# Patient Record
Sex: Male | Born: 1965 | Race: Black or African American | Hispanic: No | Marital: Married | Smoking: Current every day smoker
Health system: Southern US, Community
[De-identification: ages and names within clinical notes are randomized; demographics above are authoritative.]

## PROBLEM LIST (undated history)

## (undated) DIAGNOSIS — I2699 Other pulmonary embolism without acute cor pulmonale: Secondary | ICD-10-CM

## (undated) DIAGNOSIS — F1010 Alcohol abuse, uncomplicated: Secondary | ICD-10-CM

## (undated) DIAGNOSIS — R079 Chest pain, unspecified: Secondary | ICD-10-CM

## (undated) DIAGNOSIS — I10 Essential (primary) hypertension: Secondary | ICD-10-CM

## (undated) DIAGNOSIS — R918 Other nonspecific abnormal finding of lung field: Secondary | ICD-10-CM

## (undated) HISTORY — PX: WRIST SURGERY: SHX841

---

## 2005-04-18 ENCOUNTER — Emergency Department (HOSPITAL_COMMUNITY): Admission: EM | Admit: 2005-04-18 | Discharge: 2005-04-18 | Payer: Self-pay | Admitting: Emergency Medicine

## 2006-03-06 ENCOUNTER — Emergency Department (HOSPITAL_COMMUNITY): Admission: EM | Admit: 2006-03-06 | Discharge: 2006-03-06 | Payer: Self-pay | Admitting: Emergency Medicine

## 2006-10-27 ENCOUNTER — Emergency Department (HOSPITAL_COMMUNITY): Admission: EM | Admit: 2006-10-27 | Discharge: 2006-10-27 | Payer: Self-pay | Admitting: Emergency Medicine

## 2007-01-14 ENCOUNTER — Emergency Department (HOSPITAL_COMMUNITY): Admission: EM | Admit: 2007-01-14 | Discharge: 2007-01-14 | Payer: Self-pay | Admitting: Family Medicine

## 2007-02-28 ENCOUNTER — Emergency Department (HOSPITAL_COMMUNITY): Admission: EM | Admit: 2007-02-28 | Discharge: 2007-02-28 | Payer: Self-pay | Admitting: *Deleted

## 2007-06-11 ENCOUNTER — Emergency Department (HOSPITAL_COMMUNITY): Admission: EM | Admit: 2007-06-11 | Discharge: 2007-06-11 | Payer: Self-pay | Admitting: Emergency Medicine

## 2007-06-15 ENCOUNTER — Ambulatory Visit: Payer: Self-pay | Admitting: *Deleted

## 2008-01-13 ENCOUNTER — Emergency Department (HOSPITAL_COMMUNITY): Admission: EM | Admit: 2008-01-13 | Discharge: 2008-01-13 | Payer: Self-pay | Admitting: Emergency Medicine

## 2008-03-28 ENCOUNTER — Emergency Department (HOSPITAL_COMMUNITY): Admission: EM | Admit: 2008-03-28 | Discharge: 2008-03-29 | Payer: Self-pay | Admitting: Emergency Medicine

## 2008-11-27 ENCOUNTER — Emergency Department (HOSPITAL_COMMUNITY): Admission: EM | Admit: 2008-11-27 | Discharge: 2008-11-27 | Payer: Self-pay | Admitting: Emergency Medicine

## 2008-12-23 ENCOUNTER — Ambulatory Visit (HOSPITAL_COMMUNITY): Admission: RE | Admit: 2008-12-23 | Discharge: 2008-12-23 | Payer: Self-pay | Admitting: Otolaryngology

## 2009-08-14 ENCOUNTER — Emergency Department (HOSPITAL_COMMUNITY): Admission: EM | Admit: 2009-08-14 | Discharge: 2009-08-14 | Payer: Self-pay | Admitting: Emergency Medicine

## 2009-10-25 ENCOUNTER — Emergency Department (HOSPITAL_COMMUNITY): Admission: EM | Admit: 2009-10-25 | Discharge: 2009-10-25 | Payer: Self-pay | Admitting: Family Medicine

## 2010-05-31 LAB — CBC
HCT: 37.7 % — ABNORMAL LOW (ref 39.0–52.0)
Hemoglobin: 12.2 g/dL — ABNORMAL LOW (ref 13.0–17.0)
MCHC: 32.4 g/dL (ref 30.0–36.0)
MCV: 71.5 fL — ABNORMAL LOW (ref 78.0–100.0)
Platelets: 308 10*3/uL (ref 150–400)
RBC: 5.28 MIL/uL (ref 4.22–5.81)
RDW: 16 % — ABNORMAL HIGH (ref 11.5–15.5)
WBC: 5.4 10*3/uL (ref 4.0–10.5)

## 2010-08-05 ENCOUNTER — Emergency Department (HOSPITAL_COMMUNITY)
Admission: EM | Admit: 2010-08-05 | Discharge: 2010-08-05 | Disposition: A | Discharge status: Home / Self Care | Payer: Self-pay | Attending: Emergency Medicine | Admitting: Emergency Medicine

## 2010-08-05 DIAGNOSIS — J029 Acute pharyngitis, unspecified: Secondary | ICD-10-CM | POA: Insufficient documentation

## 2010-08-05 DIAGNOSIS — R599 Enlarged lymph nodes, unspecified: Secondary | ICD-10-CM | POA: Insufficient documentation

## 2010-08-05 LAB — RAPID STREP SCREEN (MED CTR MEBANE ONLY): Streptococcus, Group A Screen (Direct): NEGATIVE

## 2010-08-05 LAB — MONONUCLEOSIS SCREEN: Mono Screen: NEGATIVE

## 2010-09-17 ENCOUNTER — Inpatient Hospital Stay (INDEPENDENT_AMBULATORY_CARE_PROVIDER_SITE_OTHER)
Admission: RE | Admit: 2010-09-17 | Discharge: 2010-09-17 | Disposition: A | Discharge status: Home / Self Care | Payer: Self-pay | Source: Ambulatory Visit | Attending: Family Medicine | Admitting: Family Medicine

## 2010-09-17 DIAGNOSIS — L259 Unspecified contact dermatitis, unspecified cause: Secondary | ICD-10-CM

## 2010-09-21 ENCOUNTER — Emergency Department (HOSPITAL_COMMUNITY)
Admission: EM | Admit: 2010-09-21 | Discharge: 2010-09-21 | Disposition: A | Discharge status: Home / Self Care | Payer: Self-pay | Attending: Emergency Medicine | Admitting: Emergency Medicine

## 2010-09-21 ENCOUNTER — Emergency Department (HOSPITAL_COMMUNITY): Payer: Self-pay

## 2010-09-21 DIAGNOSIS — R000 Tachycardia, unspecified: Secondary | ICD-10-CM | POA: Insufficient documentation

## 2010-09-21 DIAGNOSIS — S6990XA Unspecified injury of unspecified wrist, hand and finger(s), initial encounter: Secondary | ICD-10-CM | POA: Insufficient documentation

## 2010-09-21 DIAGNOSIS — R55 Syncope and collapse: Secondary | ICD-10-CM | POA: Insufficient documentation

## 2010-09-21 DIAGNOSIS — R296 Repeated falls: Secondary | ICD-10-CM | POA: Insufficient documentation

## 2010-09-21 DIAGNOSIS — S5000XA Contusion of unspecified elbow, initial encounter: Secondary | ICD-10-CM | POA: Insufficient documentation

## 2010-09-21 DIAGNOSIS — S59909A Unspecified injury of unspecified elbow, initial encounter: Secondary | ICD-10-CM | POA: Insufficient documentation

## 2010-09-21 DIAGNOSIS — M25529 Pain in unspecified elbow: Secondary | ICD-10-CM | POA: Insufficient documentation

## 2010-09-21 DIAGNOSIS — Y9367 Activity, basketball: Secondary | ICD-10-CM | POA: Insufficient documentation

## 2010-12-04 LAB — GC/CHLAMYDIA PROBE AMP, GENITAL: GC Probe Amp, Genital: NEGATIVE

## 2010-12-04 LAB — RPR: RPR Ser Ql: NONREACTIVE

## 2010-12-04 LAB — HIV ANTIBODY (ROUTINE TESTING W REFLEX): HIV: NONREACTIVE

## 2011-01-09 ENCOUNTER — Emergency Department (INDEPENDENT_AMBULATORY_CARE_PROVIDER_SITE_OTHER)
Admission: EM | Admit: 2011-01-09 | Discharge: 2011-01-09 | Disposition: A | Discharge status: Home / Self Care | Payer: Self-pay | Source: Home / Self Care | Attending: Family Medicine | Admitting: Family Medicine

## 2011-01-09 DIAGNOSIS — J111 Influenza due to unidentified influenza virus with other respiratory manifestations: Secondary | ICD-10-CM

## 2011-01-09 DIAGNOSIS — R6889 Other general symptoms and signs: Secondary | ICD-10-CM

## 2011-01-09 MED ORDER — AZITHROMYCIN 250 MG PO TABS: 250.0000 mg | ORAL_TABLET | Freq: Every day | ORAL | Status: AC

## 2011-01-09 MED ORDER — IBUPROFEN 600 MG PO TABS: 600.0000 mg | ORAL_TABLET | Freq: Four times a day (QID) | ORAL | Status: AC | PRN

## 2011-01-09 NOTE — ED Provider Notes (Signed)
History     CSN: [id number] Arrival date & time: 01/09/2011 12:35 PM   First MD Initiated Contact with Patient 01/09/11 1258      Chief Complaint  Patient presents with  . Influenza    (Consider location/radiation/quality/duration/timing/severity/associated sxs/prior treatment) HPI Comments:     Patient is a 45 y.o. male presenting with flu symptoms. The history is provided by the patient and the spouse.  Influenza This is a new problem. The current episode started more than 2 days ago. The problem has been gradually worsening. Associated symptoms include abdominal pain and shortness of breath. The symptoms are aggravated by nothing. The symptoms are relieved by nothing. He has tried acetaminophen for the symptoms. The treatment provided no relief.    History reviewed. No pertinent past medical history.  History reviewed. No pertinent past surgical history.  Family History  Problem Relation Age of Onset  . Diabetes Mother   . Hyperlipidemia Mother     History  Substance Use Topics  . Smoking status: Current Everyday Smoker    Types: Cigarettes  . Smokeless tobacco: Not on file  . Alcohol Use: Yes     occasinal      Review of Systems  Constitutional: Positive for fever and chills. Negative for appetite change.  HENT: Positive for sneezing. Negative for sore throat and trouble swallowing.   Eyes: Negative.   Respiratory: Positive for cough and shortness of breath.   Cardiovascular: Negative.   Gastrointestinal: Positive for nausea, vomiting, abdominal pain and diarrhea.  Genitourinary: Negative.   Musculoskeletal: Positive for myalgias.  Skin: Negative.   Neurological: Negative.     Allergies  Penicillins  Home Medications  No current outpatient prescriptions on file.  BP 130/90  Pulse 73  Temp(Src) 98.5 F (36.9 C) (Oral)  Resp 12  SpO2 99%  Physical Exam  Constitutional: He appears well-developed and well-nourished.  HENT:  Head: Normocephalic  and atraumatic.  Right Ear: Tympanic membrane and external ear normal.  Left Ear: Tympanic membrane and external ear normal.  Mouth/Throat: Uvula is midline, oropharynx is clear and moist and mucous membranes are normal.  Neck: Normal range of motion. Neck supple.  Cardiovascular: Normal rate and regular rhythm.   Pulmonary/Chest: Effort normal and breath sounds normal. He has no wheezes. He has no rhonchi. He has no rales.  Abdominal: Soft. Bowel sounds are normal.  Lymphadenopathy:    He has cervical adenopathy.  Skin: Skin is warm and dry.    ED Course  Procedures (including critical care time)  Labs Reviewed - No data to display No results found.   No diagnosis found.    MDM          Richardo Priest, MD 01/09/11 [phone]

## 2011-01-09 NOTE — ED Notes (Signed)
C/o generalized pain , nausea, diarrhea, HA, sweats, chills
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
## 2011-06-30 ENCOUNTER — Emergency Department (HOSPITAL_COMMUNITY)
Admission: EM | Admit: 2011-06-30 | Discharge: 2011-07-01 | Disposition: A | Discharge status: Home / Self Care | Payer: Self-pay | Attending: Emergency Medicine | Admitting: Emergency Medicine

## 2011-06-30 ENCOUNTER — Encounter (HOSPITAL_COMMUNITY): Payer: Self-pay | Admitting: Emergency Medicine

## 2011-06-30 ENCOUNTER — Emergency Department (HOSPITAL_COMMUNITY): Payer: Self-pay

## 2011-06-30 DIAGNOSIS — X500XXA Overexertion from strenuous movement or load, initial encounter: Secondary | ICD-10-CM | POA: Insufficient documentation

## 2011-06-30 DIAGNOSIS — R079 Chest pain, unspecified: Secondary | ICD-10-CM | POA: Insufficient documentation

## 2011-06-30 DIAGNOSIS — T148XXA Other injury of unspecified body region, initial encounter: Secondary | ICD-10-CM | POA: Insufficient documentation

## 2011-06-30 DIAGNOSIS — M25519 Pain in unspecified shoulder: Secondary | ICD-10-CM | POA: Insufficient documentation

## 2011-06-30 DIAGNOSIS — M62838 Other muscle spasm: Secondary | ICD-10-CM | POA: Insufficient documentation

## 2011-06-30 DIAGNOSIS — Y990 Civilian activity done for income or pay: Secondary | ICD-10-CM | POA: Insufficient documentation

## 2011-06-30 DIAGNOSIS — IMO0001 Reserved for inherently not codable concepts without codable children: Secondary | ICD-10-CM | POA: Insufficient documentation

## 2011-06-30 DIAGNOSIS — F17200 Nicotine dependence, unspecified, uncomplicated: Secondary | ICD-10-CM | POA: Insufficient documentation

## 2011-06-30 DIAGNOSIS — M542 Cervicalgia: Secondary | ICD-10-CM | POA: Insufficient documentation

## 2011-06-30 NOTE — ED Notes (Signed)
C/o pain to neck, head, and upper back x 2 days.  Denies recent injury.  Also c/o pain to "kidneys" x 6 months.

## 2011-07-01 MED ORDER — CYCLOBENZAPRINE HCL 10 MG PO TABS
10.0000 mg | ORAL_TABLET | Freq: Once | ORAL | Status: AC
  Administered 2011-07-01: 10 mg via ORAL
  Filled 2011-07-01: qty 1

## 2011-07-01 MED ORDER — CYCLOBENZAPRINE HCL 10 MG PO TABS: 10.0000 mg | ORAL_TABLET | Freq: Two times a day (BID) | ORAL | Status: AC | PRN

## 2011-07-01 NOTE — Discharge Instructions (Signed)
FOLLOW UP WITH YOUR DOCTOR OR WITH A PRIMARY CARE PROVIDER OF YOUR CHOICE FOR FURTHER EVALUATION IF SYMPTOMS PERSIST. TAKE FLEXERIL AS DIRECTED. RETURN HERE AS NEEDED.  Muscle Strain A muscle strain, or pulled muscle, occurs when a muscle is over-stretched. A small number of muscle fibers may also be torn. This is especially common in athletes. This happens when a sudden violent force placed on a muscle pushes it past its capacity. Usually, recovery from a pulled muscle takes 1 to 2 weeks. But complete healing will take 5 to 6 weeks. There are millions of muscle fibers. Following injury, your body will usually return to normal quickly. HOME CARE INSTRUCTIONS   While awake, apply ice to the sore muscle for 15 to 20 minutes each hour for the first 2 days. Put ice in a plastic bag and place a towel between the bag of ice and your skin.   Do not use the pulled muscle for several days. Do not use the muscle if you have pain.   You may wrap the injured area with an elastic bandage for comfort. Be careful not to bind it too tightly. This may interfere with blood circulation.   Only take over-the-counter or prescription medicines for pain, discomfort, or fever as directed by your caregiver. Do not use aspirin as this will increase bleeding (bruising) at injury site.   Warming up before exercise helps prevent muscle strains.  SEEK MEDICAL CARE IF:  There is increased pain or swelling in the affected area. MAKE SURE YOU:   Understand these instructions.   Will watch your condition.   Will get help right away if you are not doing well or get worse.  Document Released: 02/11/2005 Document Revised: 01/31/2011 Document Reviewed: 09/10/2006 [facility] Patient Information 2012 [place], [place].

## 2011-07-01 NOTE — ED Provider Notes (Signed)
Medical screening examination/treatment/procedure(s) were performed by non-physician practitioner and as supervising physician I was immediately available for consultation/collaboration.   Shelda Jakes, MD 07/01/11 [phone]

## 2011-07-01 NOTE — ED Provider Notes (Signed)
History     CSN: [id number]  Arrival date & time 06/30/11  2202   First MD Initiated Contact with Patient 06/30/11 2222      Chief Complaint  Patient presents with  . Neck Pain  . Abdominal Pain    (Consider location/radiation/quality/duration/timing/severity/associated sxs/prior treatment) Patient is a 46 y.o. male presenting with neck pain. The history is provided by the patient and the spouse.  Neck Pain  This is a new problem. The current episode started more than 1 week ago. The problem occurs constantly. The problem has not changed since onset.There has been no fever. Pertinent negatives include no chest pain. Associated symptoms comments: He started a new job one month ago which required heavy lifting. He felt he twisted the wrong way and became sore across shoulders and into neck that has remained sore for the past 3 weeks. Now he reports sharp intermittent pain like a spasm that extends to bilateral lower rib cage. No N, V. No cough, breathing problems. Pain is worse with movement and better at rest. No fever.Marland Kitchen    History reviewed. No pertinent past medical history.  History reviewed. No pertinent past surgical history.  Family History  Problem Relation Age of Onset  . Diabetes Mother   . Hyperlipidemia Mother     History  Substance Use Topics  . Smoking status: Current Everyday Smoker    Types: Cigarettes  . Smokeless tobacco: Not on file  . Alcohol Use: Yes     occasinal      Review of Systems  HENT: Positive for neck pain.   Respiratory: Negative for chest tightness and shortness of breath.   Cardiovascular: Negative for chest pain.  Gastrointestinal: Positive for abdominal pain. Negative for nausea and vomiting.  Musculoskeletal: Positive for myalgias.    Allergies  Penicillins  Home Medications  No current outpatient prescriptions on file.  BP 133/87  Pulse 74  Temp(Src) 98.2 F (36.8 C) (Oral)  Resp 16  SpO2 97%  Physical Exam    Constitutional: He is oriented to person, place, and time. He appears well-developed and well-nourished. No distress.  HENT:  Head: Normocephalic.  Neck: Normal range of motion.  Pulmonary/Chest: Effort normal. He has no wheezes. He has no rales. He exhibits no tenderness.  Abdominal: Soft. There is no tenderness.  Musculoskeletal:       Muscular tenderness bilateral trapezius muscles without palpable spasm. FROM neck and bilateral upper extremities.   Neurological: He is alert and oriented to person, place, and time.  Skin: Skin is warm and dry.    ED Course  Procedures (including critical care time)  Labs Reviewed - No data to display Dg Chest 2 View  07/01/2011  *RADIOLOGY REPORT*  Clinical Data: Chest pain and back pain.  CHEST - 2 VIEW  Comparison: None.  Findings: The lungs are well-aerated and clear.  There is no evidence of focal opacification, pleural effusion or pneumothorax.  The heart is normal in size; the mediastinal contour is within normal limits.  No acute osseous abnormalities are seen.  IMPRESSION: No acute cardiopulmonary process seen.  Original Report Authenticated By: Tonia Ghent, M.D.     No diagnosis found.  1. Muscle strain 2. Muscle spasm.  MDM  Pain of complaint is reproducible. CXR negative. Presentation supports muscular strain/spasm. Feel he is stable for discharge.         Rodena Medin, PA-C 07/01/11 0036  Rodena Medin, PA-C 07/01/11 0038

## 2011-10-22 ENCOUNTER — Emergency Department (HOSPITAL_COMMUNITY): Payer: Self-pay

## 2011-10-22 ENCOUNTER — Observation Stay (HOSPITAL_COMMUNITY)
Admission: EM | Admit: 2011-10-22 | Discharge: 2011-10-23 | Disposition: A | Discharge status: Home / Self Care | Payer: Self-pay | Attending: Emergency Medicine | Admitting: Emergency Medicine

## 2011-10-22 ENCOUNTER — Encounter (HOSPITAL_COMMUNITY): Payer: Self-pay | Admitting: *Deleted

## 2011-10-22 DIAGNOSIS — R112 Nausea with vomiting, unspecified: Secondary | ICD-10-CM | POA: Insufficient documentation

## 2011-10-22 DIAGNOSIS — N289 Disorder of kidney and ureter, unspecified: Secondary | ICD-10-CM | POA: Insufficient documentation

## 2011-10-22 DIAGNOSIS — E860 Dehydration: Secondary | ICD-10-CM | POA: Insufficient documentation

## 2011-10-22 DIAGNOSIS — F10929 Alcohol use, unspecified with intoxication, unspecified: Secondary | ICD-10-CM

## 2011-10-22 DIAGNOSIS — F17200 Nicotine dependence, unspecified, uncomplicated: Secondary | ICD-10-CM | POA: Insufficient documentation

## 2011-10-22 DIAGNOSIS — R404 Transient alteration of awareness: Principal | ICD-10-CM | POA: Insufficient documentation

## 2011-10-22 DIAGNOSIS — R51 Headache: Secondary | ICD-10-CM | POA: Insufficient documentation

## 2011-10-22 DIAGNOSIS — F1010 Alcohol abuse, uncomplicated: Secondary | ICD-10-CM | POA: Insufficient documentation

## 2011-10-22 LAB — CBC WITH DIFFERENTIAL/PLATELET
Basophils Relative: 0 % (ref 0–1)
Eosinophils Absolute: 0 10*3/uL (ref 0.0–0.7)
Hemoglobin: 13.7 g/dL (ref 13.0–17.0)
MCH: 22.7 pg — ABNORMAL LOW (ref 26.0–34.0)
MCHC: 34.5 g/dL (ref 30.0–36.0)
MCV: 65.8 fL — ABNORMAL LOW (ref 78.0–100.0)
Platelets: 345 10*3/uL (ref 150–400)
RBC: 6.03 MIL/uL — ABNORMAL HIGH (ref 4.22–5.81)
WBC: 8.8 10*3/uL (ref 4.0–10.5)

## 2011-10-22 LAB — COMPREHENSIVE METABOLIC PANEL
ALT: 37 U/L (ref 0–53)
AST: 47 U/L — ABNORMAL HIGH (ref 0–37)
BUN: 17 mg/dL (ref 6–23)
Calcium: 10.7 mg/dL — ABNORMAL HIGH (ref 8.4–10.5)
Chloride: 94 mEq/L — ABNORMAL LOW (ref 96–112)
GFR calc Af Amer: 38 mL/min — ABNORMAL LOW (ref >90)
GFR calc non Af Amer: 33 mL/min — ABNORMAL LOW (ref >90)
Total Bilirubin: 0.4 mg/dL (ref 0.3–1.2)

## 2011-10-22 NOTE — ED Notes (Signed)
Pt wife contacted as patient requested, wife stated she would be in to visit patient soon.

## 2011-10-22 NOTE — ED Notes (Signed)
Pt to MRI at this time, ABC's intact, vital signs stable.

## 2011-10-22 NOTE — ED Notes (Signed)
Pt in via EMS- per EMS, pt in after being found outside of [facility] unresponsive, upon EMS arrival pt was responsive to painful stimuli, cool and diaphoretic, CBG 116, HR 110, pupils equil and reactive, pt became more responsive c/o occipital head pain, will follow commands, cooperative, pt not answering all questions appropriately, no signs on head trauma, pt denies medical history. Pt does admit to mariajuana use and ETOH use. IV established in left FA 18g. EKG unremarkable for EMS.

## 2011-10-22 NOTE — ED Provider Notes (Signed)
History     CSN: [id number]  Arrival date & time 10/22/11  1929   None     Chief Complaint  Patient presents with  . Loss of Consciousness    (Consider location/radiation/quality/duration/timing/severity/associated sxs/prior treatment) HPI Comments: Patient is a 46 year old man who was reported by EMS to found outside a [employer] unconscious. The patient says that he was going to the store and woke up in the ED. He has pain in the back of his head, and the right occipital region. He says that he has had a problem with headaches and has been vomiting. He had previous syncopal episodes, but had not sought medical evaluation for that.  Patient is a 46 y.o. male presenting with syncope. The history is provided by the patient and the EMS personnel. No language interpreter was used.  Loss of Consciousness This is a new problem. The current episode started less than 1 hour ago. The problem has been gradually improving. Associated symptoms include headaches. Pertinent negatives include no chest pain, no abdominal pain and no shortness of breath. Nothing aggravates the symptoms. Nothing relieves the symptoms. Treatments tried: Pt was brought to [facility] ED by EMS for evaluaition.    History reviewed. No pertinent past medical history.  History reviewed. No pertinent past surgical history.  Family History  Problem Relation Age of Onset  . Diabetes Mother   . Hyperlipidemia Mother     History  Substance Use Topics  . Smoking status: Current Everyday Smoker    Types: Cigarettes  . Smokeless tobacco: Not on file  . Alcohol Use: Yes     occasinal      Review of Systems  Constitutional: Negative.  Negative for fever and chills.  Eyes: Negative.   Respiratory: Negative.  Negative for shortness of breath.   Cardiovascular: Positive for syncope. Negative for chest pain.  Gastrointestinal: Positive for nausea and vomiting. Negative for abdominal pain and diarrhea.    Genitourinary: Negative.   Musculoskeletal: Negative.   Neurological: Positive for syncope and headaches.  Psychiatric/Behavioral: Negative.     Allergies  Penicillins  Home Medications  No current outpatient prescriptions on file.  BP 127/84  Pulse 111  Temp 97.7 F (36.5 C) (Oral)  Resp 16  SpO2 98%  Physical Exam  Nursing note and vitals reviewed. Constitutional: He is oriented to person, place, and time.       Middle aged man complaining of right occipital headache.  HENT:  Head: Normocephalic and atraumatic.  Right Ear: External ear normal.  Left Ear: External ear normal.  Mouth/Throat: Oropharynx is clear and moist.  Eyes: Conjunctivae and EOM are normal. Pupils are equal, round, and reactive to light.  Neck: Normal range of motion. Neck supple.  Cardiovascular: Normal rate, regular rhythm and normal heart sounds.   Pulmonary/Chest: Effort normal and breath sounds normal.  Abdominal: Soft. Bowel sounds are normal. He exhibits no distension. There is no tenderness.  Musculoskeletal: Normal range of motion. He exhibits no edema and no tenderness.  Neurological: He is alert and oriented to person, place, and time.       No sensory or motor deficit.  Skin: Skin is warm and dry.  Psychiatric: He has a normal mood and affect. His behavior is normal.    ED Course  Procedures (including critical care time)  7:46 PM  Date: 10/22/2011  Rate: 105  Rhythm: sinus tachycardia  QRS Axis: normal  Intervals: normal PQRS:  Right atrial abnormality  ST/T Wave abnormalities:  normal  Conduction Disutrbances:none  Narrative Interpretation: Borderline EKG  Old EKG Reviewed: none available  8:43 PM Pt was seen and had physical examination.  Lab workup, CT of head ordered.  9:21 PM Radiologist says that CT of head suggests dural venous thrombosis, recommends MRI, MRV of brain to check on this possibility.  Those studies ordered.  11:59 PM Results for orders placed during  the hospital encounter of 10/22/11  CBC WITH DIFFERENTIAL      Component Value Range   WBC 8.8  4.0 - 10.5 K/uL   RBC 6.03 (*) 4.22 - 5.81 MIL/uL   Hemoglobin 13.7  13.0 - 17.0 g/dL   HCT 29.5  62.1 - 30.8 %   MCV 65.8 (*) 78.0 - 100.0 fL   MCH 22.7 (*) 26.0 - 34.0 pg   MCHC 34.5  30.0 - 36.0 g/dL   RDW 65.7 (*) 84.6 - 96.2 %   Platelets 345  150 - 400 K/uL   Neutrophils Relative 78 (*) 43 - 77 %   Lymphocytes Relative 16  12 - 46 %   Monocytes Relative 6  3 - 12 %   Eosinophils Relative 0  0 - 5 %   Basophils Relative 0  0 - 1 %   Neutro Abs 6.9  1.7 - 7.7 K/uL   Lymphs Abs 1.4  0.7 - 4.0 K/uL   Monocytes Absolute 0.5  0.1 - 1.0 K/uL   Eosinophils Absolute 0.0  0.0 - 0.7 K/uL   Basophils Absolute 0.0  0.0 - 0.1 K/uL   RBC Morphology TARGET CELLS    COMPREHENSIVE METABOLIC PANEL      Component Value Range   Sodium 138  135 - 145 mEq/L   Potassium 3.5  3.5 - 5.1 mEq/L   Chloride 94 (*) 96 - 112 mEq/L   CO2 22  19 - 32 mEq/L   Glucose, Bld 110 (*) 70 - 99 mg/dL   BUN 17  6 - 23 mg/dL   Creatinine, Ser 9.52 (*) 0.50 - 1.35 mg/dL   Calcium 84.1 (*) 8.4 - 10.5 mg/dL   Total Protein 8.8 (*) 6.0 - 8.3 g/dL   Albumin 4.8  3.5 - 5.2 g/dL   AST 47 (*) 0 - 37 U/L   ALT 37  0 - 53 U/L   Alkaline Phosphatase 76  39 - 117 U/L   Total Bilirubin 0.4  0.3 - 1.2 mg/dL   GFR calc non Af Amer 33 (*) >90 mL/min   GFR calc Af Amer 38 (*) >90 mL/min  ETHANOL      Component Value Range   Alcohol, Ethyl (B) 137 (*) 0 - 11 mg/dL  POCT I-STAT TROPONIN I      Component Value Range   Troponin i, poc 0.01  0.00 - 0.08 ng/mL   Comment 3            Ct Head Wo Contrast  10/22/2011  *RADIOLOGY REPORT*  Clinical Data: Found unconscious outside drug store, history syncope, right occipital headache, vomiting  CT HEAD WITHOUT CONTRAST  Technique:  Contiguous axial images were obtained from the base of the skull through the vertex without contrast.  Comparison: Prior head CT 11/27/2008  Findings:  Prominent high attenuation within the straight sinus, torcula and right transverse sinus.  While nonspecific, findings can be seen with dural venous thrombosis.  No evidence of associated acute venous infarction.  No mass lesion, mass effect, hydrocephalus, or midline shift.  Gray-white differentiation remains preserved.  Mastoid air cells and paranasal sinuses are well-aerated.  No acute calvarial abnormality.  IMPRESSION:  Prominent high attenuation within the straight sinus, torcula and right transverse sinus concerning for possible dural venous sinus thrombosis. No evidence of associated venous ischemia/infarct, or edema.  If clinically warranted, consider further evaluation with MRI including MRV.  Critical Value/emergent results were called by telephone at the time of interpretation on 10/22/2011 at 09:10 p.m. to Dr. Ignacia Palma, who verbally acknowledged these results.   Original Report Authenticated By: HEATH    12:23 AM Pt had MRI and MRV of brain, which showed no dural venous thrombosis.  His lab tests showed some renal insufficiency and elevated blood alcohol of 137.  He is awake and alert now.  Orthostatic VS ordered.    12:47 AM Pt was orthostatic.  Will place on dehydration protocol in CDU, repeat labs in the AM.  1. Dehydration   2. Alcohol intoxication   3. Renal insufficiency        Carleene Cooper III, MD 10/23/11 [phone]

## 2011-10-23 LAB — CBC
HCT: 39.2 % (ref 39.0–52.0)
MCV: 67.1 fL — ABNORMAL LOW (ref 78.0–100.0)
RBC: 5.84 MIL/uL — ABNORMAL HIGH (ref 4.22–5.81)
WBC: 7.7 10*3/uL (ref 4.0–10.5)

## 2011-10-23 LAB — BASIC METABOLIC PANEL
BUN: 16 mg/dL (ref 6–23)
CO2: 27 mEq/L (ref 19–32)
Chloride: 101 mEq/L (ref 96–112)
Creatinine, Ser: 1.36 mg/dL — ABNORMAL HIGH (ref 0.50–1.35)

## 2011-10-23 LAB — RAPID URINE DRUG SCREEN, HOSP PERFORMED
Barbiturates: NOT DETECTED
Opiates: NOT DETECTED

## 2011-10-23 MED ORDER — ONDANSETRON HCL 4 MG/2ML IJ SOLN: 4.0000 mg | Freq: Four times a day (QID) | INTRAMUSCULAR | Status: DC | PRN

## 2011-10-23 MED ORDER — SODIUM CHLORIDE 0.9 % IV SOLN
1000.0000 mL | INTRAVENOUS | Status: DC
  Administered 2011-10-23: 1000 mL via INTRAVENOUS

## 2011-10-23 MED ORDER — ACETAMINOPHEN 325 MG PO TABS: 650.0000 mg | ORAL_TABLET | ORAL | Status: DC | PRN

## 2011-10-23 MED ORDER — SODIUM CHLORIDE 0.9 % IV SOLN
1000.0000 mL | Freq: Once | INTRAVENOUS | Status: AC
  Administered 2011-10-23: 1000 mL via INTRAVENOUS

## 2011-10-23 NOTE — ED Provider Notes (Signed)
0700 Report received from Dr. Nicanor Alcon.  Will repeat labs to reasess bun and creat.  Patient is alert and oriented ambulating to BR.  Does not remember yesterday until EMS came to pick him up.  Discussed AA and he said he has been before.  His wife works and he is currently out of work.  Denies cocaine despite the + drug test.   10am  Patient alert and oriented.  Good appetite.  MRI unremarkable.  BUN and Creat trending to nomal after IVF.  ST resolved.  Will have someone pick him up from ER.  Again encouraged to go to AA or behavioral health to get help with his drug and etoh problem.  Patient agreed.  Stable at discharge.  No pain.  Return to ER if any concerns.    Remi Haggard, NP 10/23/11 1936  Remi Haggard, NP 10/30/11 [phone]

## 2011-10-23 NOTE — Progress Notes (Signed)
Observation review is complete. 

## 2011-11-01 NOTE — ED Provider Notes (Signed)
Medical screening examination/treatment/procedure(s) were performed by non-physician practitioner and as supervising physician I was immediately available for consultation/collaboration.  Karlissa Aron K Octavion Mollenkopf-Rasch, MD 11/01/11 0058 

## 2011-11-11 ENCOUNTER — Emergency Department (HOSPITAL_COMMUNITY): Payer: Self-pay

## 2011-11-11 ENCOUNTER — Emergency Department (HOSPITAL_COMMUNITY)
Admission: EM | Admit: 2011-11-11 | Discharge: 2011-11-11 | Disposition: A | Discharge status: Home / Self Care | Payer: Self-pay | Attending: Emergency Medicine | Admitting: Emergency Medicine

## 2011-11-11 ENCOUNTER — Encounter (HOSPITAL_COMMUNITY): Payer: Self-pay | Admitting: Emergency Medicine

## 2011-11-11 DIAGNOSIS — S8990XA Unspecified injury of unspecified lower leg, initial encounter: Secondary | ICD-10-CM | POA: Insufficient documentation

## 2011-11-11 DIAGNOSIS — S99919A Unspecified injury of unspecified ankle, initial encounter: Secondary | ICD-10-CM | POA: Insufficient documentation

## 2011-11-11 DIAGNOSIS — Z880 Allergy status to penicillin: Secondary | ICD-10-CM | POA: Insufficient documentation

## 2011-11-11 DIAGNOSIS — F17200 Nicotine dependence, unspecified, uncomplicated: Secondary | ICD-10-CM | POA: Insufficient documentation

## 2011-11-11 DIAGNOSIS — X58XXXA Exposure to other specified factors, initial encounter: Secondary | ICD-10-CM | POA: Insufficient documentation

## 2011-11-11 DIAGNOSIS — E119 Type 2 diabetes mellitus without complications: Secondary | ICD-10-CM | POA: Insufficient documentation

## 2011-11-11 NOTE — ED Notes (Signed)
Ortho called, coming down to apply ortho devices

## 2011-11-11 NOTE — Progress Notes (Signed)
Orthopedic Tech Progress Note Patient Details:  Travis Reed 1965/04/23 [id number]  Ortho Devices Type of Ortho Device: Knee Immobilizer;Crutches Ortho Device/Splint Interventions: Application   Cammer, Mickie Bail 11/11/2011, 4:53 PM

## 2011-11-11 NOTE — ED Notes (Signed)
Pt c/o right knee pain x 4 days; pt sts hx of same but denies new injury
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
## 2012-05-27 ENCOUNTER — Encounter (HOSPITAL_COMMUNITY): Payer: Self-pay | Admitting: *Deleted

## 2012-05-27 ENCOUNTER — Emergency Department (HOSPITAL_COMMUNITY)
Admission: EM | Admit: 2012-05-27 | Discharge: 2012-05-27 | Disposition: A | Discharge status: Home / Self Care | Payer: Self-pay | Attending: Emergency Medicine | Admitting: Emergency Medicine

## 2012-05-27 ENCOUNTER — Emergency Department (HOSPITAL_COMMUNITY): Payer: Self-pay

## 2012-05-27 DIAGNOSIS — S2249XA Multiple fractures of ribs, unspecified side, initial encounter for closed fracture: Secondary | ICD-10-CM | POA: Insufficient documentation

## 2012-05-27 DIAGNOSIS — F17200 Nicotine dependence, unspecified, uncomplicated: Secondary | ICD-10-CM | POA: Insufficient documentation

## 2012-05-27 DIAGNOSIS — S2232XA Fracture of one rib, left side, initial encounter for closed fracture: Secondary | ICD-10-CM

## 2012-05-27 MED ORDER — OXYCODONE-ACETAMINOPHEN 5-325 MG PO TABS: 1.0000 | ORAL_TABLET | ORAL | Status: DC | PRN

## 2012-05-27 MED ORDER — MELOXICAM 15 MG PO TABS: 15.0000 mg | ORAL_TABLET | Freq: Every day | ORAL | Status: DC

## 2012-05-27 MED ORDER — OXYCODONE-ACETAMINOPHEN 5-325 MG PO TABS
2.0000 | ORAL_TABLET | Freq: Once | ORAL | Status: AC
  Administered 2012-05-27: 2 via ORAL
  Filled 2012-05-27: qty 2

## 2012-05-27 MED ORDER — ONDANSETRON 4 MG PO TBDP
4.0000 mg | ORAL_TABLET | Freq: Once | ORAL | Status: AC
  Administered 2012-05-27: 4 mg via ORAL
  Filled 2012-05-27: qty 1
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
## 2012-05-27 NOTE — ED Provider Notes (Signed)
Medical screening examination/treatment/procedure(s) were performed by non-physician practitioner and as supervising physician I was immediately available for consultation/collaboration.   Carleene Cooper III, MD 05/27/12 2149
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
## 2017-10-04 ENCOUNTER — Emergency Department (HOSPITAL_COMMUNITY)
Admission: EM | Admit: 2017-10-04 | Discharge: 2017-10-04 | Disposition: A | Discharge status: Home / Self Care | Payer: Self-pay | Attending: Emergency Medicine | Admitting: Emergency Medicine

## 2017-10-04 ENCOUNTER — Encounter (HOSPITAL_COMMUNITY): Payer: Self-pay

## 2017-10-04 ENCOUNTER — Emergency Department (HOSPITAL_COMMUNITY): Payer: Self-pay

## 2017-10-04 ENCOUNTER — Other Ambulatory Visit: Payer: Self-pay

## 2017-10-04 DIAGNOSIS — R0600 Dyspnea, unspecified: Secondary | ICD-10-CM | POA: Insufficient documentation

## 2017-10-04 DIAGNOSIS — M545 Low back pain, unspecified: Secondary | ICD-10-CM

## 2017-10-04 DIAGNOSIS — R634 Abnormal weight loss: Secondary | ICD-10-CM | POA: Insufficient documentation

## 2017-10-04 DIAGNOSIS — F1099 Alcohol use, unspecified with unspecified alcohol-induced disorder: Secondary | ICD-10-CM | POA: Insufficient documentation

## 2017-10-04 DIAGNOSIS — Y907 Blood alcohol level of 200-239 mg/100 ml: Secondary | ICD-10-CM | POA: Insufficient documentation

## 2017-10-04 DIAGNOSIS — R748 Abnormal levels of other serum enzymes: Secondary | ICD-10-CM | POA: Insufficient documentation

## 2017-10-04 DIAGNOSIS — F17210 Nicotine dependence, cigarettes, uncomplicated: Secondary | ICD-10-CM | POA: Insufficient documentation

## 2017-10-04 DIAGNOSIS — R7989 Other specified abnormal findings of blood chemistry: Secondary | ICD-10-CM | POA: Insufficient documentation

## 2017-10-04 DIAGNOSIS — R42 Dizziness and giddiness: Secondary | ICD-10-CM | POA: Insufficient documentation

## 2017-10-04 LAB — CBC WITH DIFFERENTIAL/PLATELET
Abs Immature Granulocytes: 0 10*3/uL (ref 0.0–0.1)
BASOS ABS: 0.1 10*3/uL (ref 0.0–0.1)
Basophils Relative: 1 %
Eosinophils Absolute: 0 10*3/uL (ref 0.0–0.7)
Eosinophils Relative: 1 %
HCT: 43.3 % (ref 39.0–52.0)
HEMOGLOBIN: 14.1 g/dL (ref 13.0–17.0)
IMMATURE GRANULOCYTES: 0 %
LYMPHS PCT: 36 %
Lymphs Abs: 1.9 10*3/uL (ref 0.7–4.0)
MCH: 23 pg — ABNORMAL LOW (ref 26.0–34.0)
MCHC: 32.6 g/dL (ref 30.0–36.0)
MCV: 70.5 fL — ABNORMAL LOW (ref 78.0–100.0)
MONO ABS: 0.6 10*3/uL (ref 0.1–1.0)
Monocytes Relative: 11 %
Neutro Abs: 2.7 10*3/uL (ref 1.7–7.7)
Neutrophils Relative %: 51 %
PLATELETS: 343 10*3/uL (ref 150–400)
RBC: 6.14 MIL/uL — AB (ref 4.22–5.81)
RDW: 16.6 % — ABNORMAL HIGH (ref 11.5–15.5)
WBC: 5.3 10*3/uL (ref 4.0–10.5)

## 2017-10-04 LAB — URINALYSIS, ROUTINE W REFLEX MICROSCOPIC
BILIRUBIN URINE: NEGATIVE
Glucose, UA: NEGATIVE mg/dL
Hgb urine dipstick: NEGATIVE
Ketones, ur: NEGATIVE mg/dL
Leukocytes, UA: NEGATIVE
NITRITE: NEGATIVE
PH: 6 (ref 5.0–8.0)
Protein, ur: NEGATIVE mg/dL
SPECIFIC GRAVITY, URINE: 1.002 — AB (ref 1.005–1.030)

## 2017-10-04 LAB — RAPID URINE DRUG SCREEN, HOSP PERFORMED
AMPHETAMINES: NOT DETECTED
Barbiturates: NOT DETECTED
Benzodiazepines: NOT DETECTED
Cocaine: NOT DETECTED
OPIATES: NOT DETECTED
Tetrahydrocannabinol: POSITIVE — AB

## 2017-10-04 LAB — COMPREHENSIVE METABOLIC PANEL
ALT: 78 U/L — AB (ref 0–44)
AST: 94 U/L — ABNORMAL HIGH (ref 15–41)
Albumin: 3.8 g/dL (ref 3.5–5.0)
Alkaline Phosphatase: 73 U/L (ref 38–126)
Anion gap: 12 (ref 5–15)
BUN: 9 mg/dL (ref 6–20)
CHLORIDE: 99 mmol/L (ref 98–111)
CO2: 25 mmol/L (ref 22–32)
CREATININE: 1.39 mg/dL — AB (ref 0.61–1.24)
Calcium: 9.5 mg/dL (ref 8.9–10.3)
GFR, EST NON AFRICAN AMERICAN: 56 mL/min — AB (ref >60)
Glucose, Bld: 85 mg/dL (ref 70–99)
POTASSIUM: 4.2 mmol/L (ref 3.5–5.1)
Sodium: 136 mmol/L (ref 135–145)
Total Bilirubin: 0.9 mg/dL (ref 0.3–1.2)
Total Protein: 7.1 g/dL (ref 6.5–8.1)

## 2017-10-04 LAB — LIPASE, BLOOD: LIPASE: 67 U/L — AB (ref 11–51)

## 2017-10-04 LAB — D-DIMER, QUANTITATIVE (NOT AT ARMC): D DIMER QUANT: 0.53 ug{FEU}/mL — AB (ref 0.00–0.50)

## 2017-10-04 LAB — I-STAT TROPONIN, ED: TROPONIN I, POC: 0.01 ng/mL (ref 0.00–0.08)

## 2017-10-04 LAB — ETHANOL: Alcohol, Ethyl (B): 200 mg/dL — ABNORMAL HIGH (ref <10)

## 2017-10-04 MED ORDER — MORPHINE SULFATE (PF) 4 MG/ML IV SOLN
4.0000 mg | Freq: Once | INTRAVENOUS | Status: AC
  Administered 2017-10-04: 4 mg via INTRAVENOUS
  Filled 2017-10-04: qty 1

## 2017-10-04 MED ORDER — CYCLOBENZAPRINE HCL 10 MG PO TABS: 10.0000 mg | ORAL_TABLET | Freq: Two times a day (BID) | ORAL | 0 refills | Status: DC | PRN

## 2017-10-04 MED ORDER — TRAMADOL HCL 50 MG PO TABS: 50.0000 mg | ORAL_TABLET | Freq: Four times a day (QID) | ORAL | 0 refills | Status: DC | PRN

## 2017-10-04 MED ORDER — SODIUM CHLORIDE 0.9 % IV BOLUS
1000.0000 mL | Freq: Once | INTRAVENOUS | Status: AC
  Administered 2017-10-04: 1000 mL via INTRAVENOUS

## 2017-10-04 MED ORDER — KETOROLAC TROMETHAMINE 60 MG/2ML IM SOLN
60.0000 mg | Freq: Once | INTRAMUSCULAR | Status: AC
  Administered 2017-10-04: 60 mg via INTRAMUSCULAR
  Filled 2017-10-04: qty 2

## 2017-10-04 MED ORDER — ONDANSETRON HCL 4 MG/2ML IJ SOLN
4.0000 mg | Freq: Once | INTRAMUSCULAR | Status: AC
  Administered 2017-10-04: 4 mg via INTRAVENOUS
  Filled 2017-10-04: qty 2

## 2017-10-04 NOTE — ED Provider Notes (Signed)
[facility] EMERGENCY DEPARTMENT Provider Note   CSN: [id number] Arrival date & time: 10/04/17  1203     History   Chief Complaint Chief Complaint  Patient presents with  . Back Pain  . Shortness of Breath    HPI Travis Reed is a 52 y.o. male.  Patient presents with low back pain right greater than left for 2 weeks.  He does lawn work for living, but does not think this is related to his work.  Past medical history kidney stones, but he does not have a primary care relationship.  He admits to drinking alcohol on a regular basis.  He has lost 20 pounds in the last few months.  Additionally he smokes cigarettes.  No substernal chest pain.  He does complain of vague dyspnea and dizziness.Marland Kitchen     History reviewed. No pertinent past medical history.  There are no active problems to display for this patient.   History reviewed. No pertinent surgical history.      Home Medications    Prior to Admission medications   Medication Sig Start Date End Date Taking? Authorizing Provider  cyclobenzaprine (FLEXERIL) 10 MG tablet Take 1 tablet (10 mg total) by mouth 2 (two) times daily as needed for muscle spasms. 10/04/17   Donnetta Hutching, MD  meloxicam (MOBIC) 15 MG tablet Take 1 tablet (15 mg total) by mouth daily. Take 1 daily with food. Patient not taking: Reported on 10/04/2017 05/27/12   Arthor Captain, PA-C  oxyCODONE-acetaminophen (PERCOCET) 5-325 MG per tablet Take 1-2 tablets by mouth every 4 (four) hours as needed for pain. Patient not taking: Reported on 10/04/2017 05/27/12   Arthor Captain, PA-C  traMADol (ULTRAM) 50 MG tablet Take 1 tablet (50 mg total) by mouth every 6 (six) hours as needed. 10/04/17   Donnetta Hutching, MD    Family History Family History  Problem Relation Age of Onset  . Diabetes Mother   . Hyperlipidemia Mother     Social History Social History   Tobacco Use  . Smoking status: Current Every Day Smoker    Packs/day: 0.50    Types:  Cigarettes  . Smokeless tobacco: Never Used  Substance Use Topics  . Alcohol use: Yes    Comment: occasinal  . Drug use: Yes    Types: Marijuana     Allergies   Penicillins   Review of Systems Review of Systems  All other systems reviewed and are negative.    Physical Exam Updated Vital Signs BP 117/83   Pulse 82   Temp 98 F (36.7 C) (Oral)   Resp 12   Ht 6\' 2"  (1.88 m)   Wt 68 kg   SpO2 95%   BMI 19.26 kg/m   Physical Exam  Constitutional: He is oriented to person, place, and time. He appears well-developed and well-nourished.  HENT:  Head: Normocephalic and atraumatic.  Eyes: Conjunctivae are normal.  Neck: Neck supple.  Cardiovascular: Normal rate and regular rhythm.  Pulmonary/Chest: Effort normal and breath sounds normal.  Abdominal: Soft. Bowel sounds are normal.  Musculoskeletal: Normal range of motion.  Minimal tenderness right lower back  Neurological: He is alert and oriented to person, place, and time.  Skin: Skin is warm and dry.  Psychiatric: He has a normal mood and affect. His behavior is normal.  Nursing note and vitals reviewed.    ED Treatments / Results  Labs (all labs ordered are listed, but only abnormal results are displayed) Labs Reviewed  COMPREHENSIVE  METABOLIC PANEL - Abnormal; Notable for the following components:      Result Value   Creatinine, Ser 1.39 (*)    AST 94 (*)    ALT 78 (*)    GFR calc non Af Amer 56 (*)    All other components within normal limits  LIPASE, BLOOD - Abnormal; Notable for the following components:   Lipase 67 (*)    All other components within normal limits  RAPID URINE DRUG SCREEN, HOSP PERFORMED - Abnormal; Notable for the following components:   Tetrahydrocannabinol POSITIVE (*)    All other components within normal limits  URINALYSIS, ROUTINE W REFLEX MICROSCOPIC - Abnormal; Notable for the following components:   Color, Urine STRAW (*)    Specific Gravity, Urine 1.002 (*)    All other  components within normal limits  ETHANOL - Abnormal; Notable for the following components:   Alcohol, Ethyl (B) 200 (*)    All other components within normal limits  D-DIMER, QUANTITATIVE (NOT [facility]) - Abnormal; Notable for the following components:   D-Dimer, Quant 0.53 (*)    All other components within normal limits  CBC WITH DIFFERENTIAL/PLATELET - Abnormal; Notable for the following components:   RBC 6.14 (*)    MCV 70.5 (*)    MCH 23.0 (*)    RDW 16.6 (*)    All other components within normal limits  I-STAT TROPONIN, ED    EKG EKG Interpretation  Date/Time:  Saturday October 04 2017 12:08:32 EDT Ventricular Rate:  104 PR Interval:  152 QRS Duration: 70 QT Interval:  318 QTC Calculation: 418 R Axis:   85 Text Interpretation:  Sinus tachycardia ST elevation, consider early repolarization, pericarditis, or injury Abnormal ECG Confirmed by Donnetta Hutchingook, Lexxi Koslow ([id number]) on 10/04/2017 1:50:17 PM Also confirmed by Donnetta Hutchingook, Estella Malatesta ([id number])  on 10/04/2017 1:50:51 PM   Radiology Dg Chest 2 View  Result Date: 10/04/2017 CLINICAL DATA:  Shortness of breath. EXAM: CHEST - 2 VIEW COMPARISON:  Chest and left rib radiographs 05/27/2012 FINDINGS: The heart size and mediastinal contours are within normal limits. Both lungs are clear. The visualized skeletal structures are unremarkable. IMPRESSION: No active cardiopulmonary disease. Electronically Signed   By: Marin Robertshristopher  Mattern M.D.   On: 10/04/2017 12:51    Procedures Procedures (including critical care time)  Medications Ordered in ED Medications  ketorolac (TORADOL) injection 60 mg (60 mg Intramuscular Given 10/04/17 1327)  sodium chloride 0.9 % bolus 1,000 mL (1,000 mLs Intravenous New Bag/Given 10/04/17 1408)  ondansetron (ZOFRAN) injection 4 mg (4 mg Intravenous Given 10/04/17 1401)  morphine 4 MG/ML injection 4 mg (4 mg Intravenous Given 10/04/17 1401)     Initial Impression / Assessment and Plan / ED Course  I have reviewed the triage vital  signs and the nursing notes.  Pertinent labs & imaging results that were available during my care of the patient were reviewed by me and considered in my medical decision making (see chart for details).     Patient presents with low back pain and vague dyspnea.  He admits to drinking alcohol heavily.  Age-adjusted d-dimer acceptable.  Creatinine and lipase slightly elevated.  These findings were discussed with the patient and his wife.  He is in no acute distress at discharge.  Discharge medication Flexeril 10 mg and Ultram 50 mg.  He will get primary care follow-up.  Final Clinical Impressions(s) / ED Diagnoses   Final diagnoses:  Right-sided low back pain without sciatica, unspecified chronicity  Elevated serum creatinine  Elevated lipase    ED Discharge Orders         Ordered    cyclobenzaprine (FLEXERIL) 10 MG tablet  2 times daily PRN     10/04/17 1551    traMADol (ULTRAM) 50 MG tablet  Every 6 hours PRN     10/04/17 1551           Donnetta Hutching, MD 10/04/17 1559

## 2017-10-04 NOTE — ED Notes (Signed)
Patient given discharge instructions and verbalized understanding.  Patient stable to discharge at this time.  Patient is alert and oriented to baseline.  No distressed noted at this time.  All belongings taken with the patient at discharge.   

## 2017-10-04 NOTE — ED Notes (Signed)
Patient eating and drinking.

## 2017-10-04 NOTE — Discharge Instructions (Addendum)
Your tests showed an elevated lipase which is a chemical associated with your pancreas gland [and related to alcohol consumption].  Additionally your creatinine (measures kidney function) is slightly elevated.  Recommend stopping alcohol.  Medication for pain and muscle spasm.  Try to get a primary care doctor.

## 2017-10-04 NOTE — ED Triage Notes (Signed)
Pt c/o back pain and SOB states that he has history of kidney problems. Pt reports he has not been for a couple of weeks and has lost weight, has history of kidney stones that have been removed.

## 2017-10-04 NOTE — ED Notes (Signed)
Family at bedside. 

## 2018-03-25 ENCOUNTER — Emergency Department (HOSPITAL_COMMUNITY)
Admission: EM | Admit: 2018-03-25 | Discharge: 2018-03-25 | Disposition: A | Discharge status: Home / Self Care | Payer: Self-pay | Attending: Emergency Medicine | Admitting: Emergency Medicine

## 2018-03-25 ENCOUNTER — Other Ambulatory Visit: Payer: Self-pay

## 2018-03-25 DIAGNOSIS — R600 Localized edema: Secondary | ICD-10-CM

## 2018-03-25 DIAGNOSIS — F17210 Nicotine dependence, cigarettes, uncomplicated: Secondary | ICD-10-CM | POA: Insufficient documentation

## 2018-03-25 DIAGNOSIS — B353 Tinea pedis: Secondary | ICD-10-CM | POA: Insufficient documentation

## 2018-03-25 DIAGNOSIS — L03115 Cellulitis of right lower limb: Secondary | ICD-10-CM | POA: Insufficient documentation

## 2018-03-25 DIAGNOSIS — Z79899 Other long term (current) drug therapy: Secondary | ICD-10-CM | POA: Insufficient documentation

## 2018-03-25 LAB — BASIC METABOLIC PANEL
Anion gap: 12 (ref 5–15)
BUN: 7 mg/dL (ref 6–20)
CALCIUM: 8.2 mg/dL — AB (ref 8.9–10.3)
CHLORIDE: 105 mmol/L (ref 98–111)
CO2: 20 mmol/L — AB (ref 22–32)
CREATININE: 0.95 mg/dL (ref 0.61–1.24)
GFR calc non Af Amer: >60 mL/min (ref >60)
GLUCOSE: 70 mg/dL (ref 70–99)
Potassium: 3.7 mmol/L (ref 3.5–5.1)
Sodium: 137 mmol/L (ref 135–145)

## 2018-03-25 LAB — URINALYSIS, ROUTINE W REFLEX MICROSCOPIC
BILIRUBIN URINE: NEGATIVE
GLUCOSE, UA: NEGATIVE mg/dL
Hgb urine dipstick: NEGATIVE
Ketones, ur: NEGATIVE mg/dL
LEUKOCYTES UA: NEGATIVE
NITRITE: NEGATIVE
PH: 5 (ref 5.0–8.0)
Protein, ur: NEGATIVE mg/dL
SPECIFIC GRAVITY, URINE: 1.008 (ref 1.005–1.030)

## 2018-03-25 MED ORDER — CEPHALEXIN 500 MG PO CAPS: 500.0000 mg | ORAL_CAPSULE | Freq: Four times a day (QID) | ORAL | 0 refills | Status: DC

## 2018-03-25 MED ORDER — CLOTRIMAZOLE 1 % EX CREA: TOPICAL_CREAM | CUTANEOUS | 0 refills | Status: DC

## 2018-03-25 NOTE — Discharge Instructions (Addendum)
Wear compression hose to help with swelling of your legs.  Limit salt intake, elevate legs. Take Keflex as prescribed and complete the full course for right leg redness. Apply Lotrimin to your feet for athlete's foot infection.  Follow-up with a primary care provider.  Monitor your blood pressure, record readings and take with you to follow-up.

## 2018-03-25 NOTE — ED Provider Notes (Signed)
[facility] EMERGENCY DEPARTMENT Provider Note   CSN: [id number] Arrival date & time: 03/25/18  1409     History   Chief Complaint Chief Complaint  Patient presents with  . Leg Swelling    HPI Travis Reed is a 53 y.o. male.  53 year old male presents with complaint of swelling in his legs with rash to his feet.  Patient reports symptoms have been ongoing for the past year, thinks it may be related to his kidneys or high blood pressure.  Patient states that his legs ache and itch when they are swollen.  Ports itching to his feet.  Patient states he checked his blood pressure this morning and it was 120/80, blood pressure mildly elevated in the ER, no previous diagnosis of hypertension has never taken medication for blood pressure.  No other complaints or concerns.     No past medical history on file.  There are no active problems to display for this patient.   No past surgical history on file.      Home Medications    Prior to Admission medications   Medication Sig Start Date End Date Taking? Authorizing Provider  cephALEXin (KEFLEX) 500 MG capsule Take 1 capsule (500 mg total) by mouth 4 (four) times daily. 03/25/18   Jeannie Fend, PA-C  clotrimazole (LOTRIMIN) 1 % cream Apply to affected area 2 times daily 03/25/18   Army Melia A, PA-C  cyclobenzaprine (FLEXERIL) 10 MG tablet Take 1 tablet (10 mg total) by mouth 2 (two) times daily as needed for muscle spasms. 10/04/17   Donnetta Hutching, MD  traMADol (ULTRAM) 50 MG tablet Take 1 tablet (50 mg total) by mouth every 6 (six) hours as needed. 10/04/17   Donnetta Hutching, MD    Family History Family History  Problem Relation Age of Onset  . Diabetes Mother   . Hyperlipidemia Mother     Social History Social History   Tobacco Use  . Smoking status: Current Every Day Smoker    Packs/day: 0.50    Types: Cigarettes  . Smokeless tobacco: Never Used  Substance Use Topics  . Alcohol use: Yes   Comment: occasinal  . Drug use: Yes    Types: Marijuana     Allergies   Penicillins   Review of Systems Review of Systems  Constitutional: Negative for chills and fever.  Eyes: Negative for visual disturbance.  Cardiovascular: Negative for chest pain.  Musculoskeletal: Positive for myalgias. Negative for arthralgias and gait problem.  Skin: Positive for color change and rash. Negative for wound.  Neurological: Negative for weakness, numbness and headaches.  Hematological: Negative for adenopathy. Does not bruise/bleed easily.  All other systems reviewed and are negative.    Physical Exam Updated Vital Signs BP (!) 132/96 (BP Location: Right Arm)   Pulse 80   Temp 98.2 F (36.8 C) (Oral)   Resp 16   Ht 6\' 3"  (1.905 m)   Wt 83.9 kg   SpO2 100%   BMI 23.12 kg/m   Physical Exam Vitals signs and nursing note reviewed.  Constitutional:      General: He is not in acute distress.    Appearance: He is well-developed. He is not diaphoretic.  HENT:     Head: Normocephalic and atraumatic.  Cardiovascular:     Rate and Rhythm: Normal rate and regular rhythm.     Pulses: Normal pulses.     Heart sounds: Normal heart sounds. No murmur.  Pulmonary:     Effort:  Pulmonary effort is normal.     Breath sounds: Normal breath sounds.  Musculoskeletal:        General: No tenderness.     Right lower leg: Edema present.     Left lower leg: Edema present.  Skin:    General: Skin is warm and dry.     Findings: Erythema and rash present.       Neurological:     Mental Status: He is alert and oriented to person, place, and time.  Psychiatric:        Behavior: Behavior normal.      ED Treatments / Results  Labs (all labs ordered are listed, but only abnormal results are displayed) Labs Reviewed  BASIC METABOLIC PANEL - Abnormal; Notable for the following components:      Result Value   CO2 20 (*)    Calcium 8.2 (*)    All other components within normal limits    URINALYSIS, ROUTINE W REFLEX MICROSCOPIC - Abnormal; Notable for the following components:   Color, Urine STRAW (*)    All other components within normal limits    EKG None  Radiology No results found.  Procedures Procedures (including critical care time)  Medications Ordered in ED Medications - No data to display   Initial Impression / Assessment and Plan / ED Course  I have reviewed the triage vital signs and the nursing notes.  Pertinent labs & imaging results that were available during my care of the patient were reviewed by me and considered in my medical decision making (see chart for details).  Clinical Course as of Mar 25 2334  Wed Mar 25, 2018  [id number] 53 year old male presents with complaint of bilateral lower extremity edema, right greater than left.  Patient states that his legs have been swollen like this for the past year, the right has always been worse than the left, this is nothing new for him however his significant other is concerned that this is due to high blood pressure as he was told previously that he had kidney disease.  Patient checked his blood pressure at home today and states his blood pressure was "normal" and states it was somewhere near 120/80.  Question mild redness to the right lower leg may be related to cellulitis, doubt DVT as this has been chronic swelling for at least 1 year.  Patient does have athletes affecting both feet.  Patient's blood pressure improved at time of discharge to 132/96, urinalysis is negative for protein, BMP with normal renal function.  Recommend patient monitor salt intake, elevate legs, wear compression hose.  Given Keflex for possible cellulitis of the right lower leg as well as Lotrimin for his athlete's foot.  Patient was advised to monitor his blood pressure and follow-up with a primary care provider should he need further med management.   [LM]    Clinical Course User Index [LM] Jeannie FendMurphy, Shatisha Falter A, PA-C   Final Clinical  Impressions(s) / ED Diagnoses   Final diagnoses:  Leg edema  Cellulitis of right lower extremity  Tinea pedis of both feet    ED Discharge Orders         Ordered    clotrimazole (LOTRIMIN) 1 % cream     03/25/18 1815    cephALEXin (KEFLEX) 500 MG capsule  4 times daily     03/25/18 1815           Alden HippMurphy, Leroi Haque A, PA-C 03/25/18 2336    Tilden Fossaees, Elizabeth, MD 03/30/18 [phone]

## 2018-03-25 NOTE — ED Notes (Signed)
This NT informed the patient that a urine sample is needed and patient stated, "I don't need to go right now, I'm waiting." RN has been notified.

## 2018-03-25 NOTE — ED Triage Notes (Addendum)
Pt reports increased swelling in his lower extremities bilaterally over the last week. Pt reports that he has also had increased "pressure" feeling in his lower extremities as well. More swelling noted on the right lower extremity than the left upon assessment.

## 2018-04-03 ENCOUNTER — Ambulatory Visit: Payer: Self-pay | Admitting: Family Medicine

## 2018-06-11 ENCOUNTER — Encounter: Payer: Self-pay | Admitting: Cardiovascular Disease

## 2018-06-15 ENCOUNTER — Emergency Department (HOSPITAL_COMMUNITY): Payer: Self-pay

## 2018-06-15 ENCOUNTER — Encounter (HOSPITAL_COMMUNITY): Payer: Self-pay | Admitting: Emergency Medicine

## 2018-06-15 ENCOUNTER — Other Ambulatory Visit: Payer: Self-pay

## 2018-06-15 ENCOUNTER — Observation Stay (HOSPITAL_COMMUNITY)
Admission: EM | Admit: 2018-06-15 | Discharge: 2018-06-16 | Disposition: A | Discharge status: Home / Self Care | Payer: Self-pay | Attending: Internal Medicine | Admitting: Internal Medicine

## 2018-06-15 DIAGNOSIS — R718 Other abnormality of red blood cells: Secondary | ICD-10-CM

## 2018-06-15 DIAGNOSIS — I10 Essential (primary) hypertension: Secondary | ICD-10-CM

## 2018-06-15 DIAGNOSIS — Z880 Allergy status to penicillin: Secondary | ICD-10-CM

## 2018-06-15 DIAGNOSIS — Z720 Tobacco use: Secondary | ICD-10-CM

## 2018-06-15 DIAGNOSIS — R030 Elevated blood-pressure reading, without diagnosis of hypertension: Secondary | ICD-10-CM | POA: Insufficient documentation

## 2018-06-15 DIAGNOSIS — R7989 Other specified abnormal findings of blood chemistry: Secondary | ICD-10-CM | POA: Insufficient documentation

## 2018-06-15 DIAGNOSIS — Z79899 Other long term (current) drug therapy: Secondary | ICD-10-CM | POA: Insufficient documentation

## 2018-06-15 DIAGNOSIS — I2699 Other pulmonary embolism without acute cor pulmonale: Principal | ICD-10-CM | POA: Diagnosis present

## 2018-06-15 DIAGNOSIS — F17210 Nicotine dependence, cigarettes, uncomplicated: Secondary | ICD-10-CM | POA: Insufficient documentation

## 2018-06-15 LAB — POCT I-STAT EG7
Bicarbonate: 28 mmol/L (ref 20.0–28.0)
Calcium, Ion: 1.15 mmol/L (ref 1.15–1.40)
HCT: 48 % (ref 39.0–52.0)
Hemoglobin: 16.3 g/dL (ref 13.0–17.0)
O2 Saturation: 52 %
Patient temperature: 37
Potassium: 3.7 mmol/L (ref 3.5–5.1)
Sodium: 142 mmol/L (ref 135–145)
TCO2: 30 mmol/L (ref 22–32)
pCO2, Ven: 55.9 mmHg (ref 44.0–60.0)
pH, Ven: 7.307 (ref 7.250–7.430)
pO2, Ven: 31 mmHg — CL (ref 32.0–45.0)

## 2018-06-15 LAB — BASIC METABOLIC PANEL
Anion gap: 14 (ref 5–15)
BUN: 7 mg/dL (ref 6–20)
CO2: 24 mmol/L (ref 22–32)
Calcium: 8.7 mg/dL — ABNORMAL LOW (ref 8.9–10.3)
Chloride: 103 mmol/L (ref 98–111)
Creatinine, Ser: 1.23 mg/dL (ref 0.61–1.24)
GFR calc Af Amer: >60 mL/min (ref >60)
GFR calc non Af Amer: >60 mL/min (ref >60)
Glucose, Bld: 94 mg/dL (ref 70–99)
Potassium: 3.9 mmol/L (ref 3.5–5.1)
Sodium: 141 mmol/L (ref 135–145)

## 2018-06-15 LAB — CBC WITH DIFFERENTIAL/PLATELET
Abs Immature Granulocytes: 0.02 10*3/uL (ref 0.00–0.07)
Basophils Absolute: 0.1 10*3/uL (ref 0.0–0.1)
Basophils Relative: 1 %
Eosinophils Absolute: 0.1 10*3/uL (ref 0.0–0.5)
Eosinophils Relative: 1 %
HCT: 43.9 % (ref 39.0–52.0)
Hemoglobin: 13.9 g/dL (ref 13.0–17.0)
Immature Granulocytes: 0 %
Lymphocytes Relative: 23 %
Lymphs Abs: 1.8 10*3/uL (ref 0.7–4.0)
MCH: 22.6 pg — ABNORMAL LOW (ref 26.0–34.0)
MCHC: 31.7 g/dL (ref 30.0–36.0)
MCV: 71.4 fL — ABNORMAL LOW (ref 80.0–100.0)
Monocytes Absolute: 1.3 10*3/uL — ABNORMAL HIGH (ref 0.1–1.0)
Monocytes Relative: 16 %
Neutro Abs: 4.5 10*3/uL (ref 1.7–7.7)
Neutrophils Relative %: 59 %
Platelets: 466 10*3/uL — ABNORMAL HIGH (ref 150–400)
RBC: 6.15 MIL/uL — ABNORMAL HIGH (ref 4.22–5.81)
RDW: 18.3 % — ABNORMAL HIGH (ref 11.5–15.5)
WBC: 7.8 10*3/uL (ref 4.0–10.5)
nRBC: 0 % (ref 0.0–0.2)

## 2018-06-15 LAB — D-DIMER, QUANTITATIVE: D-Dimer, Quant: 2.05 ug/mL-FEU — ABNORMAL HIGH (ref 0.00–0.50)

## 2018-06-15 LAB — I-STAT CREATININE, ED: Creatinine, Ser: 1.3 mg/dL — ABNORMAL HIGH (ref 0.61–1.24)

## 2018-06-15 LAB — TROPONIN I: Troponin I: <0.03 ng/mL (ref <0.03)

## 2018-06-15 LAB — HEPARIN LEVEL (UNFRACTIONATED): Heparin Unfractionated: <0.1 IU/mL — ABNORMAL LOW (ref 0.30–0.70)

## 2018-06-15 MED ORDER — KETOROLAC TROMETHAMINE 15 MG/ML IJ SOLN
15.0000 mg | Freq: Once | INTRAMUSCULAR | Status: AC
  Administered 2018-06-15: 15 mg via INTRAVENOUS
  Filled 2018-06-15: qty 1

## 2018-06-15 MED ORDER — HEPARIN (PORCINE) 25000 UT/250ML-% IV SOLN
1500.0000 [IU]/h | INTRAVENOUS | Status: DC
  Administered 2018-06-15: 1400 [IU]/h via INTRAVENOUS
  Administered 2018-06-15: 1100 [IU]/h via INTRAVENOUS
  Filled 2018-06-15: qty 250

## 2018-06-15 MED ORDER — ACETAMINOPHEN 325 MG PO TABS: 650.0000 mg | ORAL_TABLET | Freq: Four times a day (QID) | ORAL | Status: DC | PRN

## 2018-06-15 MED ORDER — ACETAMINOPHEN 650 MG RE SUPP: 650.0000 mg | Freq: Four times a day (QID) | RECTAL | Status: DC | PRN

## 2018-06-15 MED ORDER — OXYCODONE-ACETAMINOPHEN 7.5-325 MG PO TABS
1.0000 | ORAL_TABLET | Freq: Four times a day (QID) | ORAL | Status: DC | PRN
  Administered 2018-06-15: 10:00:00 1 via ORAL
  Filled 2018-06-15: qty 1

## 2018-06-15 MED ORDER — LIDOCAINE 5 % EX PTCH
2.0000 | MEDICATED_PATCH | CUTANEOUS | Status: DC
  Administered 2018-06-15: 2 via TRANSDERMAL
  Administered 2018-06-16: 1 via TRANSDERMAL
  Filled 2018-06-15 (×2): qty 2

## 2018-06-15 MED ORDER — ACETAMINOPHEN 500 MG PO TABS
1000.0000 mg | ORAL_TABLET | Freq: Once | ORAL | Status: AC
Start: 2018-06-15 — End: 2018-06-15
  Administered 2018-06-15: 1000 mg via ORAL
  Filled 2018-06-15: qty 2

## 2018-06-15 MED ORDER — HEPARIN BOLUS VIA INFUSION
2250.0000 [IU] | Freq: Once | INTRAVENOUS | Status: AC
  Administered 2018-06-15: 2250 [IU] via INTRAVENOUS
  Filled 2018-06-15: qty 2250

## 2018-06-15 MED ORDER — IOHEXOL 350 MG/ML SOLN
100.0000 mL | Freq: Once | INTRAVENOUS | Status: AC | PRN
  Administered 2018-06-15: 100 mL via INTRAVENOUS

## 2018-06-15 MED ORDER — OXYCODONE-ACETAMINOPHEN 7.5-325 MG PO TABS
1.0000 | ORAL_TABLET | ORAL | Status: DC | PRN
  Administered 2018-06-15 – 2018-06-16 (×4): 1 via ORAL
  Filled 2018-06-15 (×4): qty 1

## 2018-06-15 MED ORDER — HEPARIN BOLUS VIA INFUSION
4000.0000 [IU] | Freq: Once | INTRAVENOUS | Status: AC
  Administered 2018-06-15: 09:00:00 4000 [IU] via INTRAVENOUS
  Filled 2018-06-15: qty 4000

## 2018-06-15 NOTE — H&P (Signed)
Date: 06/15/2018               Patient Name:  Travis Reed MRN: [id number]  DOB: Jun 21, 1965 Age / Sex: 53 y.o., male   PCP: Default, Provider, MD         Medical Service: Internal Medicine Teaching Service         Attending Physician: Dr. Gust RungHoffman, Travis C, DO    First Contact: Dr. Julian HyPrince, Travis Pager: [phone]  Second Contact: Dr. Lorenso Courierhundi, Vahini Pager: [phone]       After Hours (After 5p/  First Contact Pager: [phone]  weekends / holidays): Second Contact Pager: [phone]   Chief Complaint: Chest pain   History of Present Illness: Patient noticed right sided chest pain that progressed beginning one week ago it was positional and pleuritic.  No shortness of breath just limited in taking deep breaths due to pain.  No fevers or chills, no diarrhea or nausea.  He reports decreased appetite since stopping working last year only 5lbs of weight loss in the last several months due to this.  He has no pcp, was seen for bilateral leg swelling 2 months ago.  Has had no cancer screenings.  No history of blood clots in his family, minimal cancer history mother with breast cancer in remission.  He is an everyday smoker smokes 1/3ppd.    ED course: elevated d dimer, negative troponin, CT angio with multiple small emboli bilaterally and a focal PE in the RLL with possible early infarction.     Meds:  No outpatient medications have been marked as taking for the 06/15/18 encounter [facility](Hospital Encounter).     Allergies: Allergies as of 06/15/2018 - Review Complete 06/15/2018  Allergen Reaction Noted  . Penicillins Other (See Comments) 01/09/2011   History reviewed. No pertinent past medical history.  Family History:  Family History  Problem Relation Age of Onset  . Diabetes Mother   . Hyperlipidemia Mother    Mother breast cancer in remission Otherwise no cancer history Social History:  Social History   Socioeconomic History  . Marital status: Single    Spouse name: Not on file  .  Number of children: Not on file  . Years of education: Not on file  . Highest education level: Not on file  Occupational History  . Not on file  Social Needs  . Financial resource strain: Not on file  . Food insecurity:    Worry: Not on file    Inability: Not on file  . Transportation needs:    Medical: Not on file    Non-medical: Not on file  Tobacco Use  . Smoking status: Current Every Day Smoker    Packs/day: 0.50    Types: Cigarettes  . Smokeless tobacco: Never Used  Substance and Sexual Activity  . Alcohol use: Yes    Comment: occasinal  . Drug use: Not Currently    Types: Marijuana  . Sexual activity: Not on file  Lifestyle  . Physical activity:    Days per week: Not on file    Minutes per session: Not on file  . Stress: Not on file  Relationships  . Social connections:    Talks on phone: Not on file    Gets together: Not on file    Attends religious service: Not on file    Active member of club or organization: Not on file    Attends meetings of clubs or organizations: Not on file    Relationship status:  Not on file  . Intimate partner violence:    Fear of current or ex partner: Not on file    Emotionally abused: Not on file    Physically abused: Not on file    Forced sexual activity: Not on file  Other Topics Concern  . Not on file  Social History Narrative  . Not on file    Review of Systems: A complete ROS was negative except as per HPI.   Physical Exam: Blood pressure (!) 144/101, pulse 80, temperature 98.5 F (36.9 C), temperature source Oral, resp. rate 14, height 6\' 3"  (1.905 m), weight 74.8 kg, SpO2 99 %. Physical Exam Constitutional:      Appearance: He is not diaphoretic.  HENT:     Head: Normocephalic and atraumatic.  Eyes:     General: No scleral icterus.       Right eye: No discharge.        Left eye: No discharge.  Neck:     Musculoskeletal: Normal range of motion and neck supple.  Cardiovascular:     Rate and Rhythm: Normal rate  and regular rhythm.     Heart sounds: Normal heart sounds. No murmur. No friction rub. No gallop.   Pulmonary:     Effort: Pulmonary effort is normal. No respiratory distress.     Breath sounds: Normal breath sounds. No wheezing or rales.     Comments: Exam somewhat limited, too painful for pt to take a deep breath Abdominal:     General: There is no distension.     Palpations: Abdomen is soft. There is no mass.     Tenderness: There is no abdominal tenderness. There is no guarding.  Musculoskeletal:     Right lower leg: No edema.     Left lower leg: No edema.  Neurological:     Mental Status: He is alert and oriented to person, place, and time.  Psychiatric:        Mood and Affect: Mood normal.        Behavior: Behavior normal.     EKG: personally reviewed my interpretation is SR, possible LAE  CXR: personally reviewed my interpretation is  Clear bilaterally, no visible abnormalities  Assessment & Plan by Problem: Active Problems:   Pulmonary embolism (HCC)  Pulmonary Embolism: pt presented with pleuritic chest pain, CT angio with multiple small emboli bilaterally and a focal PE in the RLL with possible early infarction.  He was started on IV heparin   -continue heparin transition to an affordable NOAC if stable tomorrow -percocet 7.5-325 Q6 PRN for pain -will need colonoscopy outpt  HTN: in the setting of acute PE  -monitor, will need PCP follow up on d/c  Microcytosis: likely thalassemia, mentzer index low  Dispo: Admit patient to Observation with expected length of stay less than 2 midnights.  Signed: Angelita Ingles, MD 06/15/2018, 9:45 AM

## 2018-06-15 NOTE — TOC Initial Note (Addendum)
Transition of Care [facility](TOC) - Initial/Assessment Note    Patient Details  Name: Travis ForemanJohn L Adkins MRN: [id number] Date of Birth: 09/09/1965  Transition of Care [facility](TOC) CM/SW Contact:    Leone Havenaylor, Emet Rafanan Clinton, RN Phone Number: 06/15/2018, 1:25 PM  Clinical Narrative:                 From home with wife, has no insurance, no pcp, [facility] medication [facility] with [facility] and 30 day coupon for NOAC and TOC to fill scripts prior to dc, patient ast application for NOAC. Patient has no DME at home, wife will transport home at discharge.  4/21 Letha Capeeborah Artelia Game RN, BSN - NCM spoke with patient, informed him the RN will bring him the patient assistance form for eliquis and he will need to take that to [facility], while he is waiting on patient asst he can get refill for 10.00 at the clinic.  TOC pharmacy is filling the 30 day free eliquis for him and if he has enough funds to pay for the percocet they will fill that also, if he does not have funds for the percocet he will need to go to a local pharmacy.   Expected Discharge Plan: Home/Self Care Barriers to Discharge: Inadequate or no insurance   Patient Goals and CMS Choice Patient states their goals for this hospitalization and ongoing recovery are:: (to get better)   Choice offered to / list presented to : NA  Expected Discharge Plan and Services Expected Discharge Plan: Home/Self Care In-house Referral: Financial Counselor Discharge Planning Services: CM Consult Post Acute Care Choice: NA Living arrangements for the past 2 months: Single Family Home Expected Discharge Date: 06/19/18               DME Arranged: N/A DME Agency: NA HH Arranged: NA HH Agency: NA  Prior Living Arrangements/Services Living arrangements for the past 2 months: Single Family Home Lives with:: Spouse Patient language and need for interpreter reviewed:: Yes Do you feel safe going back to the place where you live?:  Yes      Need for Family Participation in Patient Care: No (Comment) Care giver support system in place?: No (comment)   Criminal Activity/Legal Involvement Pertinent to Current Situation/Hospitalization: No - Comment as needed  Activities of Daily Living Home Assistive Devices/Equipment: None ADL Screening (condition at time of admission) Patient's cognitive ability adequate to safely complete daily activities?: Yes Is the patient deaf or have difficulty hearing?: No Does the patient have difficulty seeing, even when wearing glasses/contacts?: No Does the patient have difficulty concentrating, remembering, or making decisions?: No Patient able to express need for assistance with ADLs?: Yes Does the patient have difficulty dressing or bathing?: No Independently performs ADLs?: Yes (appropriate for developmental age) Does the patient have difficulty walking or climbing stairs?: No Weakness of Legs: None Weakness of Arms/Hands: None  Permission Sought/Granted                  Emotional Assessment   Attitude/Demeanor/Rapport: Engaged Affect (typically observed): Accepting, Appropriate Orientation: : Oriented to Self, Oriented to Place, Oriented to  Time, Oriented to Situation   Psych Involvement: No (comment)  Admission diagnosis:  Acute pulmonary embolism without acute cor pulmonale, unspecified pulmonary embolism type [facility](HCC) [I26.99] Patient Active Problem List   Diagnosis Date Noted  . Pulmonary embolism (HCC) 06/15/2018   PCP:  Default, Provider, MD [id number] [address] [address], [place] - [phone] [address] AT [facility] [address] [place] [address] Phone: [phone] Fax: [phone]     Social Determinants of Health (SDOH) Interventions    Readmission Risk Interventions Readmission Risk Prevention Plan 06/15/2018  Medication Screening Complete  Transportation Screening Complete  Some recent data  might be hidden

## 2018-06-15 NOTE — ED Notes (Signed)
Pt in x-ray at this time

## 2018-06-15 NOTE — ED Notes (Signed)
ED TO INPATIENT HANDOFF REPORT  ED Nurse Name and Phone #: Tammy SoursGreg RN - [phone]  S Name/Age/Gender Travis BeersJohn L Reed 53 y.o. male Room/Bed: 032C/032C  Code Status   Code Status: Full Code  Home/SNF/Other Home Patient oriented to: self, place, time and situation Is this baseline? Yes   Triage Complete: Triage complete  Chief Complaint flank pain  Triage Note Pt transported from home by EMS with co R sided CP radiating around R ribs x 1 week. Pt states pain began while sleeping, no strenuous activity, denies cough. Denies n/v   Allergies Allergies  Allergen Reactions  . Penicillins Other (See Comments)    Unknown childhood reaction    Level of Care/Admitting Diagnosis ED Disposition    ED Disposition Condition Comment   Admit  [facility] [[id number][facility]  Level of Care: Telemetry Medical [104]  Covid Evaluation: N/A  Diagnosis: Pulmonary embolism (HCC) [241700]  Admitting Physician: Silvio PateHOFFMAN, ERIK C [[id number]]  Attending Physician: HOFFMAN, ERIK C [[id number]]  PT Class (Do Not Modify): Observation [104]  PT Acc Code (Do Not Modify): Observation [10022]       B Medical/Surgery History History reviewed. No pertinent past medical history. Past Surgical History:  Procedure Laterality Date  . WRIST SURGERY Left      A IV Location/Drains/Wounds Patient Lines/Drains/Airways Status   Active Line/Drains/Airways    Name:   Placement date:   Placement time:   Site:   Days:   Peripheral IV 06/15/18 Right Antecubital   06/15/18    0628    Antecubital   less than 1          Intake/Output Last 24 hours No intake or output data in the 24 hours ending 06/15/18 1007  Labs/Imaging Results for orders placed or performed during the hospital encounter of 06/15/18 (from the past 48 hour(s))  CBC with Differential/Platelet     Status: Abnormal   Collection Time: 06/15/18  5:34 AM  Result Value Ref Range   WBC 7.8 4.0 - 10.5 K/uL   RBC 6.15 (H) 4.22 - 5.81  MIL/uL   Hemoglobin 13.9 13.0 - 17.0 g/dL   HCT 84.143.9 32.439.0 - 40.152.0 %   MCV 71.4 (L) 80.0 - 100.0 fL   MCH 22.6 (L) 26.0 - 34.0 pg   MCHC 31.7 30.0 - 36.0 g/dL   RDW 02.718.3 (H) 25.311.5 - 66.415.5 %   Platelets 466 (H) 150 - 400 K/uL   nRBC 0.0 0.0 - 0.2 %   Neutrophils Relative % 59 %   Neutro Abs 4.5 1.7 - 7.7 K/uL   Lymphocytes Relative 23 %   Lymphs Abs 1.8 0.7 - 4.0 K/uL   Monocytes Relative 16 %   Monocytes Absolute 1.3 (H) 0.1 - 1.0 K/uL   Eosinophils Relative 1 %   Eosinophils Absolute 0.1 0.0 - 0.5 K/uL   Basophils Relative 1 %   Basophils Absolute 0.1 0.0 - 0.1 K/uL   Immature Granulocytes 0 %   Abs Immature Granulocytes 0.02 0.00 - 0.07 K/uL    Comment: Performed at [facility], [address] [place], [place] [address]  Basic metabolic panel     Status: Abnormal   Collection Time: 06/15/18  5:34 AM  Result Value Ref Range   Sodium 141 135 - 145 mmol/L   Potassium 3.9 3.5 - 5.1 mmol/L   Chloride 103 98 - 111 mmol/L   CO2 24 22 - 32 mmol/L   Glucose, Bld 94 70 - 99  mg/dL   BUN 7 6 - 20 mg/dL   Creatinine, Ser 6.28 0.61 - 1.24 mg/dL   Calcium 8.7 (L) 8.9 - 10.3 mg/dL   GFR calc non Af Amer >60 >60 mL/min   GFR calc Af Amer >60 >60 mL/min   Anion gap 14 5 - 15    Comment: Performed at [facility], [address], [place], [place] [address]  Troponin I - ONCE - STAT     Status: None   Collection Time: 06/15/18  5:34 AM  Result Value Ref Range   Troponin I <0.03 <0.03 ng/mL    Comment: Performed at [facility], [address], [place], [place] [address]  D-dimer, quantitative (not at [facility])     Status: Abnormal   Collection Time: 06/15/18  5:34 AM  Result Value Ref Range   D-Dimer, Quant 2.05 (H) 0.00 - 0.50 ug/mL-FEU    Comment: (NOTE) At the manufacturer cut-off of 0.50 ug/mL FEU, this assay has been documented to exclude PE with a sensitivity and negative predictive value of 97 to 99%.  At this time, this assay has not been approved by the FDA to  exclude DVT/VTE. Results should be correlated with clinical presentation. Performed at [facility], [address] [place], [place] [address]   POCT I-Stat EG7     Status: Abnormal   Collection Time: 06/15/18  6:31 AM  Result Value Ref Range   pH, Ven 7.307 7.250 - 7.430   pCO2, Ven 55.9 44.0 - 60.0 mmHg   pO2, Ven 31.0 (LL) 32.0 - 45.0 mmHg   Bicarbonate 28.0 20.0 - 28.0 mmol/L   TCO2 30 22 - 32 mmol/L   O2 Saturation 52.0 %   Sodium 142 135 - 145 mmol/L   Potassium 3.7 3.5 - 5.1 mmol/L   Calcium, Ion 1.15 1.15 - 1.40 mmol/L   HCT 48.0 39.0 - 52.0 %   Hemoglobin 16.3 13.0 - 17.0 g/dL   Patient temperature 46.5 C    Sample type VENOUS    Comment NOTIFIED PHYSICIAN   I-stat Creatinine, ED     Status: Abnormal   Collection Time: 06/15/18  6:32 AM  Result Value Ref Range   Creatinine, Ser 1.30 (H) 0.61 - 1.24 mg/dL   Dg Chest 2 View  Result Date: 06/15/2018 CLINICAL DATA:  53 year old male with right side chest pain for 1 week. EXAM: CHEST - 2 VIEW COMPARISON:  10/04/2017 and earlier. FINDINGS: Lung volumes and mediastinal contours are stable and within normal limits. Visualized tracheal air column is within normal limits. No pneumothorax. Both lungs remain clear. Paucity of bowel gas in the upper abdomen. No acute osseous abnormality identified. IMPRESSION: Negative.  No cardiopulmonary abnormality. Electronically Signed   By: Odessa Fleming M.D.   On: 06/15/2018 05:56   Ct Angio Chest Pe W And/or Wo Contrast  Result Date: 06/15/2018 CLINICAL DATA:  Right-sided chest pain for 1 week EXAM: CT ANGIOGRAPHY CHEST WITH CONTRAST TECHNIQUE: Multidetector CT imaging of the chest was performed using the standard protocol during bolus administration of intravenous contrast. Multiplanar CT image reconstructions and MIPs were obtained to evaluate the vascular anatomy. CONTRAST:  OMNIPAQUE IOHEXOL 350 MG/ML SOLN COMPARISON:  Chest x-ray from earlier in the same day. FINDINGS: Cardiovascular:  Thoracic aorta shows no atherosclerotic calcifications or aneurysmal dilatation. No findings to suggest dissection are noted. Pulmonary artery shows a normal branching pattern. Focal filling defect is noted in the pulmonary artery on the right  involving the anterior aspect of the right lower lobe. No other focal filling defect is noted. No cardiac enlargement is noted. Mediastinum/Nodes: Thoracic inlet is within normal limits. No hilar or mediastinal adenopathy is noted. No esophageal abnormality is noted. Lungs/Pleura: The lungs are well aerated bilaterally. Increased pleural based density is noted the anterior aspect of the right lower lobe laterally consistent with the known pulmonary embolism. These changes likely represent early pulmonary infarct. Similar but less prominent changes are noted in the apices bilaterally likely related to small peripheral emboli which are not well appreciated on this exam. Upper Abdomen: No acute abnormality. Musculoskeletal: No chest wall abnormality. No acute or significant osseous findings. Review of the MIP images confirms the above findings. IMPRESSION: Right lower lobe pulmonary emboli with associated changes of early pulmonary ischemia Electronically Signed   By: Alcide Clever M.D.   On: 06/15/2018 08:01    Pending Labs Unresulted Labs (From admission, onward)    Start     Ordered   06/16/18 0500  CBC  Tomorrow morning,   R     06/15/18 0928   06/16/18 0500  Comprehensive metabolic panel  Tomorrow morning,   R     06/15/18 0928   06/15/18 0928  HIV antibody (Routine Testing)  Add-on,   R     06/15/18 0928          Vitals/Pain Today's Vitals   06/15/18 0745 06/15/18 0800 06/15/18 0910 06/15/18 0915  BP: (!) 128/97 (!) 124/93 (!) 144/101 (!) 148/100  Pulse: 74 76 80 73  Resp: (!) 28 (!) Temp:      TempSrc:      SpO2: 98% 98% 99% 100%  Weight:      Height:      PainSc:   9      Isolation Precautions No active  isolations  Medications Medications  lidocaine (LIDODERM) 5 % 2 patch (2 patches Transdermal Patch Applied 06/15/18 0647)  heparin ADULT infusion 100 units/mL (25000 units/243mL sodium chloride 0.45%) (1,100 Units/hr Intravenous New Bag/Given 06/15/18 0927)  oxyCODONE-acetaminophen (PERCOCET) 7.5-325 MG per tablet 1 tablet (has no administration in time range)  ketorolac (TORADOL) 15 MG/ML injection 15 mg (15 mg Intravenous Given 06/15/18 0644)  acetaminophen (TYLENOL) tablet 1,000 mg (1,000 mg Oral Given 06/15/18 0645)  iohexol (OMNIPAQUE) 350 MG/ML injection 100 mL (100 mLs Intravenous Contrast Given 06/15/18 0659)  heparin bolus via infusion 4,000 Units (4,000 Units Intravenous Bolus from Bag 06/15/18 0927)    Mobility walks     Focused Assessments    R Recommendations: See Admitting Provider Note  Report given to:   Additional Notes:

## 2018-06-15 NOTE — Progress Notes (Signed)
Paged Dr. Avie Arenas via amion to make her aware of BP of 155/112 and MD called back to unit.  Patient not having any chest pain.  MD stated that she does not want to treat the diastolic blood pressure right now in the setting of a PE and to continue to monitor and if it gets worse to call her back.

## 2018-06-15 NOTE — ED Triage Notes (Signed)
Pt transported from home by EMS with co R sided CP radiating around R ribs x 1 week. Pt states pain began while sleeping, no strenuous activity, denies cough. Denies n/v

## 2018-06-15 NOTE — ED Provider Notes (Signed)
[facility] EMERGENCY DEPARTMENT Provider Note   CSN: [id number] Arrival date & time: 06/15/18  0500    History   Chief Complaint Chief Complaint  Patient presents with  . Chest Pain    HPI Travis Reed is a 53 y.o. male.     .  The history is provided by the patient.  Chest Pain  Pain location:  R chest Pain quality: sharp   Pain quality: not radiating   Pain radiates to:  Does not radiate Pain severity:  Severe Onset quality:  Gradual Duration:  1 week Timing:  Constant Progression:  Unchanged Chronicity:  New Context: breathing, movement and at rest   Context: not eating   Relieved by:  Nothing Worsened by:  Nothing Ineffective treatments:  None tried Associated symptoms: no abdominal pain, no AICD problem, no altered mental status, no anorexia, no anxiety, no back pain, no claudication, no cough, no diaphoresis, no dizziness, no dysphagia, no fatigue, no fever, no headache, no heartburn, no lower extremity edema, no nausea, no near-syncope, no numbness, no orthopnea, no palpitations, no PND, no shortness of breath, no syncope, no vomiting and no weakness   Risk factors: smoking   Risk factors: no prior DVT/PE     History reviewed. No pertinent past medical history.  There are no active problems to display for this patient.   Past Surgical History:  Procedure Laterality Date  . WRIST SURGERY Left         Home Medications    Prior to Admission medications   Medication Sig Start Date End Date Taking? Authorizing Provider  cephALEXin (KEFLEX) 500 MG capsule Take 1 capsule (500 mg total) by mouth 4 (four) times daily. Patient not taking: Reported on 06/15/2018 03/25/18   Army Melia A, PA-C  clotrimazole (LOTRIMIN) 1 % cream Apply to affected area 2 times daily Patient not taking: Reported on 06/15/2018 03/25/18   Army Melia A, PA-C  cyclobenzaprine (FLEXERIL) 10 MG tablet Take 1 tablet (10 mg total) by mouth 2 (two) times daily as  needed for muscle spasms. Patient not taking: Reported on 06/15/2018 10/04/17   Donnetta Hutching, MD  traMADol (ULTRAM) 50 MG tablet Take 1 tablet (50 mg total) by mouth every 6 (six) hours as needed. Patient not taking: Reported on 06/15/2018 10/04/17   Donnetta Hutching, MD    Family History Family History  Problem Relation Age of Onset  . Diabetes Mother   . Hyperlipidemia Mother     Social History Social History   Tobacco Use  . Smoking status: Current Every Day Smoker    Packs/day: 0.50    Types: Cigarettes  . Smokeless tobacco: Never Used  Substance Use Topics  . Alcohol use: Yes    Comment: occasinal  . Drug use: Not Currently    Types: Marijuana     Allergies   Penicillins   Review of Systems Review of Systems  Constitutional: Negative for diaphoresis, fatigue and fever.  HENT: Negative for sore throat and trouble swallowing.   Respiratory: Negative for cough and shortness of breath.   Cardiovascular: Positive for chest pain. Negative for palpitations, orthopnea, claudication, leg swelling, syncope, PND and near-syncope.  Gastrointestinal: Negative for abdominal pain, anorexia, heartburn, nausea and vomiting.  Musculoskeletal: Negative for back pain.  Neurological: Negative for dizziness, weakness, numbness and headaches.  All other systems reviewed and are negative.    Physical Exam Updated Vital Signs BP (!) 141/98 (BP Location: Left Arm)   Pulse 92  Temp 98.5 F (36.9 C) (Oral)   Resp 20   Ht  (1.905 m)   Wt 74.8 kg   SpO2 98%   BMI 20.62 kg/m   Physical Exam Vitals signs and nursing note reviewed.  Constitutional:      Appearance: He is normal weight. He is not diaphoretic.  HENT:     Head: Normocephalic and atraumatic.     Nose: Nose normal.  Eyes:     Conjunctiva/sclera: Conjunctivae normal.     Pupils: Pupils are equal, round, and reactive to light.  Neck:     Musculoskeletal: Normal range of motion and neck supple.  Cardiovascular:      Rate and Rhythm: Normal rate and regular rhythm.     Pulses: Normal pulses.     Heart sounds: Normal heart sounds.  Pulmonary:     Effort: Pulmonary effort is normal. No respiratory distress.     Breath sounds: Normal breath sounds. No wheezing or rales.  Chest:    Abdominal:     General: Abdomen is flat. Bowel sounds are normal.     Tenderness: There is no abdominal tenderness. There is no guarding or rebound.  Musculoskeletal: Normal range of motion.        General: No tenderness.     Right lower leg: No edema.     Left lower leg: No edema.  Skin:    General: Skin is warm and dry.     Capillary Refill: Capillary refill takes less than 2 seconds.  Neurological:     General: No focal deficit present.     Mental Status: He is alert and oriented to person, place, and time.  Psychiatric:        Mood and Affect: Mood normal.        Behavior: Behavior normal.      ED Treatments / Results  Labs (all labs ordered are listed, but only abnormal results are displayed) Results for orders placed or performed during the hospital encounter of 06/15/18  CBC with Differential/Platelet  Result Value Ref Range   WBC 7.8 4.0 - 10.5 K/uL   RBC 6.15 (H) 4.22 - 5.81 MIL/uL   Hemoglobin 13.9 13.0 - 17.0 g/dL   HCT 16.1 09.6 - 04.5 %   MCV 71.4 (L) 80.0 - 100.0 fL   MCH 22.6 (L) 26.0 - 34.0 pg   MCHC 31.7 30.0 - 36.0 g/dL   RDW 40.9 (H) 81.1 - 91.4 %   Platelets 466 (H) 150 - 400 K/uL   nRBC 0.0 0.0 - 0.2 %   Neutrophils Relative % 59 %   Neutro Abs 4.5 1.7 - 7.7 K/uL   Lymphocytes Relative 23 %   Lymphs Abs 1.8 0.7 - 4.0 K/uL   Monocytes Relative 16 %   Monocytes Absolute 1.3 (H) 0.1 - 1.0 K/uL   Eosinophils Relative 1 %   Eosinophils Absolute 0.1 0.0 - 0.5 K/uL   Basophils Relative 1 %   Basophils Absolute 0.1 0.0 - 0.1 K/uL   Immature Granulocytes 0 %   Abs Immature Granulocytes 0.02 0.00 - 0.07 K/uL  Basic metabolic panel  Result Value Ref Range   Sodium 141 135 - 145 mmol/L    Potassium 3.9 3.5 - 5.1 mmol/L   Chloride 103 98 - 111 mmol/L   CO2 24 22 - 32 mmol/L   Glucose, Bld 94 70 - 99 mg/dL   BUN 7 6 - 20 mg/dL   Creatinine, Ser 7.82 0.61 - 1.24 mg/dL  Calcium 8.7 (L) 8.9 - 10.3 mg/dL   GFR calc non Af Amer >60 >60 mL/min   GFR calc Af Amer >60 >60 mL/min   Anion gap 14 5 - 15  Troponin I - ONCE - STAT  Result Value Ref Range   Troponin I <0.03 <0.03 ng/mL  D-dimer, quantitative (not [facility])  Result Value Ref Range   D-Dimer, Quant 2.05 (H) 0.00 - 0.50 ug/mL-FEU  I-stat Creatinine, ED  Result Value Ref Range   Creatinine, Ser 1.30 (H) 0.61 - 1.24 mg/dL  POCT I-Stat EG7  Result Value Ref Range   pH, Ven 7.307 7.250 - 7.430   pCO2, Ven 55.9 44.0 - 60.0 mmHg   pO2, Ven 31.0 (LL) 32.0 - 45.0 mmHg   Bicarbonate 28.0 20.0 - 28.0 mmol/L   TCO2 30 22 - 32 mmol/L   O2 Saturation 52.0 %   Sodium 142 135 - 145 mmol/L   Potassium 3.7 3.5 - 5.1 mmol/L   Calcium, Ion 1.15 1.15 - 1.40 mmol/L   HCT 48.0 39.0 - 52.0 %   Hemoglobin 16.3 13.0 - 17.0 g/dL   Patient temperature 16.137.0 C    Sample type VENOUS    Comment NOTIFIED PHYSICIAN    Dg Chest 2 View  Result Date: 06/15/2018 CLINICAL DATA:  53 year old male with right side chest pain for 1 week. EXAM: CHEST - 2 VIEW COMPARISON:  10/04/2017 and earlier. FINDINGS: Lung volumes and mediastinal contours are stable and within normal limits. Visualized tracheal air column is within normal limits. No pneumothorax. Both lungs remain clear. Paucity of bowel gas in the upper abdomen. No acute osseous abnormality identified. IMPRESSION: Negative.  No cardiopulmonary abnormality. Electronically Signed   By: Odessa FlemingH  Hall M.D.   On: 06/15/2018 05:56    EKG EKG Interpretation  Date/Time:  Monday Misha Antonini 20 2020 05:15:11 EDT Ventricular Rate:  86 PR Interval:    QRS Duration: 75 QT Interval:  350 QTC Calculation: 419 R Axis:   64 Text Interpretation:  Sinus rhythm Confirmed by Ebony Rickel ([id number]) on 06/15/2018 6:02:01 AM    Radiology Dg Chest 2 View  Result Date: 06/15/2018 CLINICAL DATA:  53 year old male with right side chest pain for 1 week. EXAM: CHEST - 2 VIEW COMPARISON:  10/04/2017 and earlier. FINDINGS: Lung volumes and mediastinal contours are stable and within normal limits. Visualized tracheal air column is within normal limits. No pneumothorax. Both lungs remain clear. Paucity of bowel gas in the upper abdomen. No acute osseous abnormality identified. IMPRESSION: Negative.  No cardiopulmonary abnormality. Electronically Signed   By: Odessa FlemingH  Hall M.D.   On: 06/15/2018 05:56    Procedures Procedures (including critical care time)  Medications Ordered in ED Medications  lidocaine (LIDODERM) 5 % 2 patch (2 patches Transdermal Patch Applied 06/15/18 0647)  heparin ADULT infusion 100 units/mL (25000 units/25550mL sodium chloride 0.45%) (has no administration in time range)  ketorolac (TORADOL) 15 MG/ML injection 15 mg (15 mg Intravenous Given 06/15/18 0644)  acetaminophen (TYLENOL) tablet 1,000 mg (1,000 mg Oral Given 06/15/18 0645)  iohexol (OMNIPAQUE) 350 MG/ML injection 100 mL (100 mLs Intravenous Contrast Given 06/15/18 0659)     Will admit for acute PE.    MDM Reviewed: nursing note and vitals Interpretation: labs, ECG, x-ray and CT scan (elevated ddimer negative troponin.  Normal chest Xray,) Total time providing critical care: 30-74 minutes (heparin initiated by me). This excludes time spent performing separately reportable procedures and services. Consults: admitting MD   CRITICAL CARE Performed by:  Tatem Holsonback K Tatyanna Cronk-Rasch Total critical care time: 60 minutes Critical care time was exclusive of separately billable procedures and treating other patients. Critical care was necessary to treat or prevent imminent or life-threatening deterioration. Critical care was time spent personally by me on the following activities: development of treatment plan with patient and/or surrogate as well as nursing,  discussions with consultants, evaluation of patient's response to treatment, examination of patient, obtaining history from patient or surrogate, ordering and performing treatments and interventions, ordering and review of laboratory studies, ordering and review of radiographic studies, pulse oximetry and re-evaluation of patient's condition. Final Clinical Impressions(s) / ED Diagnoses   Admit for acute PE   Jmari Pelc, MD 06/15/18 4540

## 2018-06-15 NOTE — ED Notes (Addendum)
Patient transported to CT 

## 2018-06-15 NOTE — Progress Notes (Signed)
ANTICOAGULATION CONSULT NOTE - Initial Consult  Pharmacy Consult for heparin Indication: pulmonary embolus  Patient Measurements: Height: 6\' 3"  (190.5 cm) Weight: 143 lb 1.3 oz (64.9 kg) IBW/kg (Calculated) : 84.5 Heparin Dosing Weight: 75 kg  Vital Signs: Temp: 98.5 F (36.9 C) (04/20 1500) Temp Source: Oral (04/20 1500) BP: 148/100 (04/20 1500) Pulse Rate: 70 (04/20 1500)  Labs: Recent Labs    06/15/18 0534 06/15/18 0631 06/15/18 0632 06/15/18 1535  HGB 13.9 16.3  --   --   HCT 43.9 48.0  --   --   PLT 466*  --   --   --   HEPARINUNFRC  --   --   --  <0.10*  CREATININE 1.23  --  1.30*  --   TROPONINI <0.03  --   --   --     Assessment: Admitted with R-sided chest pain. CTA shows R lower lobe pulmonary emboli. SCr 1.3, cbc stable, no anticoagulation prior to admission.   Heparin level undetectable on 1100 units/hr. No reports of bleeding or infusion related issues per RN.   Goal of Therapy:  Heparin level 0.3-0.7 units/ml Monitor platelets by anticoagulation protocol: Yes    Plan:  -Heparin bolus 2250 units x1  -Inc heparin to 1400 units/hr -Monitor daily HL, CBC, s/sx bleeding -6 hour heparin level    Lenward Chancellor, PharmD PGY1 Pharmacy Resident 06/15/2018,4:41 PM

## 2018-06-15 NOTE — Progress Notes (Signed)
ANTICOAGULATION CONSULT NOTE - Initial Consult  Pharmacy Consult for heparin Indication: pulmonary embolus  Patient Measurements: Height: 6\' 3"  (190.5 cm) Weight: 165 lb (74.8 kg) IBW/kg (Calculated) : 84.5 Heparin Dosing Weight: 75 kg  Vital Signs: Temp: 98.5 F (36.9 C) (04/20 0527) Temp Source: Oral (04/20 0527) BP: 148/100 (04/20 0915) Pulse Rate: 73 (04/20 0915)  Labs: Recent Labs    06/15/18 0534 06/15/18 0631 06/15/18 0632  HGB 13.9 16.3  --   HCT 43.9 48.0  --   PLT 466*  --   --   CREATININE 1.23  --  1.30*  TROPONINI <0.03  --   --     Assessment: Admitted with R-sided chest pain. CTA shows R lower lobe pulmonary emboli. SCr 1.3, cbc stable, no anticoagulation prior to admission. Will begin heparin infusion.   Goal of Therapy:  Heparin level 0.3-0.7 units/ml Monitor platelets by anticoagulation protocol: Yes    Plan:  -Heparin bolus 4000 units x1 then 1100 units/hr -Daily HL, CBC -Level this evening   Baldemar Friday 06/15/2018,10:25 AM

## 2018-06-15 NOTE — ED Notes (Signed)
Heparin verified by 2nd RN Maud Deed

## 2018-06-15 NOTE — Progress Notes (Signed)
Spoke with the patient's wife at [phone] to update her on the patient's status.

## 2018-06-16 LAB — CBC
HCT: 41 % (ref 39.0–52.0)
Hemoglobin: 13.3 g/dL (ref 13.0–17.0)
MCH: 22.9 pg — ABNORMAL LOW (ref 26.0–34.0)
MCHC: 32.4 g/dL (ref 30.0–36.0)
MCV: 70.7 fL — ABNORMAL LOW (ref 80.0–100.0)
Platelets: 392 10*3/uL (ref 150–400)
RBC: 5.8 MIL/uL (ref 4.22–5.81)
RDW: 16.5 % — ABNORMAL HIGH (ref 11.5–15.5)
WBC: 9.8 10*3/uL (ref 4.0–10.5)
nRBC: 0 % (ref 0.0–0.2)

## 2018-06-16 LAB — COMPREHENSIVE METABOLIC PANEL
ALT: 23 U/L (ref 0–44)
AST: 32 U/L (ref 15–41)
Albumin: 3 g/dL — ABNORMAL LOW (ref 3.5–5.0)
Alkaline Phosphatase: 83 U/L (ref 38–126)
Anion gap: 10 (ref 5–15)
BUN: 8 mg/dL (ref 6–20)
CO2: 27 mmol/L (ref 22–32)
Calcium: 8.7 mg/dL — ABNORMAL LOW (ref 8.9–10.3)
Chloride: 99 mmol/L (ref 98–111)
Creatinine, Ser: 1.11 mg/dL (ref 0.61–1.24)
GFR calc Af Amer: >60 mL/min (ref >60)
GFR calc non Af Amer: >60 mL/min (ref >60)
Glucose, Bld: 155 mg/dL — ABNORMAL HIGH (ref 70–99)
Potassium: 3.3 mmol/L — ABNORMAL LOW (ref 3.5–5.1)
Sodium: 136 mmol/L (ref 135–145)
Total Bilirubin: 0.7 mg/dL (ref 0.3–1.2)
Total Protein: 6.2 g/dL — ABNORMAL LOW (ref 6.5–8.1)

## 2018-06-16 LAB — HEPARIN LEVEL (UNFRACTIONATED)
Heparin Unfractionated: 0.3 IU/mL (ref 0.30–0.70)
Heparin Unfractionated: 0.43 IU/mL (ref 0.30–0.70)

## 2018-06-16 LAB — HIV ANTIBODY (ROUTINE TESTING W REFLEX): HIV Screen 4th Generation wRfx: NONREACTIVE

## 2018-06-16 MED ORDER — APIXABAN (ELIQUIS) EDUCATION KIT FOR DVT/PE PATIENTS: 1.0000 | PACK | Freq: Once | 0 refills | Status: AC

## 2018-06-16 MED ORDER — HYDROMORPHONE HCL 1 MG/ML IJ SOLN
0.5000 mg | Freq: Once | INTRAMUSCULAR | Status: AC | PRN
  Administered 2018-06-16: 0.5 mg via INTRAVENOUS
  Filled 2018-06-16: qty 1

## 2018-06-16 MED ORDER — KETOROLAC TROMETHAMINE 15 MG/ML IJ SOLN: 15.0000 mg | Freq: Once | INTRAMUSCULAR | Status: DC

## 2018-06-16 MED ORDER — OXYCODONE-ACETAMINOPHEN 5-325 MG PO TABS
2.0000 | ORAL_TABLET | Freq: Once | ORAL | Status: AC
  Administered 2018-06-16: 2 via ORAL
  Filled 2018-06-16: qty 2

## 2018-06-16 MED ORDER — OXYCODONE-ACETAMINOPHEN 7.5-325 MG PO TABS: 1.0000 | ORAL_TABLET | ORAL | 0 refills | Status: DC | PRN

## 2018-06-16 MED ORDER — ELIQUIS 5 MG VTE STARTER PACK: ORAL_TABLET | ORAL | 0 refills | Status: DC

## 2018-06-16 MED ORDER — APIXABAN 5 MG PO TABS: 5.0000 mg | ORAL_TABLET | Freq: Two times a day (BID) | ORAL | 0 refills | Status: DC

## 2018-06-16 MED FILL — ELIQUIS STARTER PACK 5 MG T: 5 | 30 days supply | Qty: 74 | Fill #0

## 2018-06-16 NOTE — Progress Notes (Signed)
ANTICOAGULATION CONSULT NOTE - Follow Up Consult  Pharmacy Consult for heparin Indication: pulmonary embolus  Labs: Recent Labs    06/15/18 0534 06/15/18 0631 06/15/18 0632 06/15/18 1535 06/16/18 0013  HGB 13.9 16.3  --   --  13.3  HCT 43.9 48.0  --   --  41.0  PLT 466*  --   --   --  392  HEPARINUNFRC  --   --   --  <0.10* 0.30  CREATININE 1.23  --  1.30*  --   --   TROPONINI <0.03  --   --   --   --     Assessment: 53yo male now therapeutic on heparin though at very low end of goal; no gtt issues per RN; Hgb down 3 points but no overt signs of bleeding per RN, no charted fluid boluses, RN to monitor for signs of bleeding.  Goal of Therapy:  Heparin level 0.3-0.7 units/ml   Plan:  Will increase heparin gtt by 1-2 units/kg/hr to 1500 units/hr and check level in 6 hours.    Vernard Gambles, PharmD, BCPS  06/16/2018,1:07 AM

## 2018-06-16 NOTE — Progress Notes (Signed)
Ambulated pt in hallway while checking SPO2. Pt stayed in 96%-98%.

## 2018-06-16 NOTE — Progress Notes (Signed)
ANTICOAGULATION CONSULT NOTE - Follow Up Consult  Pharmacy Consult for heparin Indication: pulmonary embolus  Labs: Recent Labs    06/15/18 0534 06/15/18 0631 06/15/18 0632 06/15/18 1535 06/16/18 0013 06/16/18 0710  HGB 13.9 16.3  --   --  13.3  --   HCT 43.9 48.0  --   --  41.0  --   PLT 466*  --   --   --  392  --   HEPARINUNFRC  --   --   --  <0.10* 0.30 0.43  CREATININE 1.23  --  1.30*  --  1.11  --   TROPONINI <0.03  --   --   --   --   --     Assessment: 53yo male now therapeutic on heparin though at very low end of goal; no gtt issues per RN; Hgb down 3 points but no overt signs of bleeding per RN, no charted fluid boluses, RN to monitor for signs of bleeding.  HL this AM 0.43  Goal of Therapy:  Heparin level 0.3-0.7 units/ml   Plan:  Continue heparin drip at 1500 units/hr and check daily Heparin levels.    Durga Saldarriaga A. Jeanella Craze, PharmD, BCPS Clinical Pharmacist [place] Please utilize Amion for appropriate phone number to reach the unit pharmacist [facility])   06/16/2018,8:10 AM

## 2018-06-16 NOTE — Discharge Summary (Signed)
Name: Travis Reed MRN: [id number] DOB: October 15, 1965 53 y.o. PCP: Default, Provider, MD  Date of Admission: 06/15/2018  5:00 AM Date of Discharge: 06/16/18 Attending Physician: Lucious Groves, DO  Discharge Diagnosis: 1.  Unprovoked pulmonary embolism: 2.  Elevated blood pressure  Discharge Medications: Allergies as of 06/16/2018      Reactions   Penicillins Other (See Comments)   Unknown childhood reaction      Medication List    STOP taking these medications   cephALEXin 500 MG capsule Commonly known as:  KEFLEX   clotrimazole 1 % cream Commonly known as:  LOTRIMIN   cyclobenzaprine 10 MG tablet Commonly known as:  FLEXERIL   traMADol 50 MG tablet Commonly known as:  ULTRAM     TAKE these medications   apixaban Kit Commonly known as:  ELIQUIS 1 kit by Does not apply route once for 1 dose.   Eliquis DVT/PE Starter Pack 5 MG Tabs Take as directed on package: start with two-52m tablets twice daily for 7 days. On day 8, switch to one-543mtablet twice daily.   apixaban 5 MG Tabs tablet Commonly known as:  ELIQUIS Take 1 tablet (5 mg total) by mouth 2 (two) times daily. Begin pack after completing 7 day starter apixaban pack.   oxyCODONE-acetaminophen 7.5-325 MG tablet Commonly known as:  PERCOCET Take 1 tablet by mouth every 4 (four) hours as needed for severe pain.       Disposition and follow-up:   [facility]  At the hospital follow up visit please address:  1.  Compliance with anticoagulation as well as respiratory status.  2.  Please recheck blood pressure and add antihypertensive medications accordingly.  3.  Labs / imaging needed at time of follow-up: None  4.  Pending labs/ test needing follow-up: None  Follow-up Appointments: [place] [facility] [place] [facility].   Why:  11:10 for follow up and assistance with  medications Contact information: [phone]          Hospital Course by problem list: 1.  Unprovoked pulmonary embolism: Mr. CoValliresented with pleuritic and positional chest pain.  He was found to have an elevated d-dimer and CTA confirmed pulmonary embolism.  He was started on IV heparin.  Vital signs remained stable throughout admission.  He was transitioned to a direct oral anticoagulant with Eliquis and instructed that he will need to take this medication for a minimum of 6 months.  2.  Elevated blood pressure: He was found to have elevated blood pressure throughout his admission.  His elevated blood pressure may be related to his pulmonary embolism and chest pain.  Please reassess his blood pressure at hospital follow-up and add antihypertensive as indicated.  Discharge Vitals:   BP (!) 136/98 (BP Location: Left Arm)   Pulse 69   Temp 97.8 F (36.6 C) (Oral)   Resp 16   Ht 6' 3"  (1.905 m)   Wt 64.9 kg   SpO2 98%   BMI 17.88 kg/m   Pertinent Labs, Studies, and Procedures:  CT angios chest: IMPRESSION: Right lower lobe pulmonary emboli with associated changes of early pulmonary ischemia  Discharge Instructions:   Thank you so much for allowing [facility] care for you during your admission. You were found to have multiple clots in the lung and were treated with a  blood thinner. It is very important for you to continue the oral blood thinner called apixaban daily for the next 6 months. Additionally we have made an appointment with a new primary care doctor in the next week. Please make sure that you attend this appointment as they will be managing your medications from now on.    Signed: Carroll Sage, MD 06/16/2018, 11:43 AM   Pager: [phone]

## 2018-06-16 NOTE — Progress Notes (Signed)
   Subjective: Patient reports that he had a mouth full of blood this morning while he was brushing his teeth, just in the sink when he spit out. He reports some pain when he moves, he reports that the pain medications helped. We discussed that he will be starting on a blood thinner for at least 6 months and that he will need to follow up with his PCP to discuss continuing it past this. We discussed return precautions and what side effects to look out for.  Spoke with his wife on the phone about patients hospital course and plan for today. Discussed that he will need to be started on a new medication when he is discharged. And that he may be discharged later today.   Objective:  Vital signs in last 24 hours: Vitals:   06/15/18 1200 06/15/18 1500 06/15/18 1745 06/15/18 2300  BP:  (!) 148/100 (!) 158/111 (!) 155/112  Pulse:  70 72 62  Resp:  16 16 16   Temp:  98.5 F (36.9 C)  98.3 F (36.8 C)  TempSrc:  Oral  Oral  SpO2:  98%  99%  Weight: 64.9 kg     Height: 6\' 3"  (1.905 m)      General: Well appearing male, NAD, laying comfortably in bed Cardiac: Normal rate, regular rhythm Pulmonary: Normal WOB, CTAB, normal oxygen sats on room air Psych: Normal affect, behavior, and mood   Assessment/Plan:  Active Problems:   Pulmonary embolism (HCC)  Ms. Fahrney is a 53 year old male with pleuritic chest pain and found to have multiple small pulmonary emboli bilaterally.   Unprovoked PE: - Currently on heparin-> Will plan on transitioning to either rivaroxaban vs apixaban (no contraindication to either; whichever is cheapest). He will continue a minimum of 6 months of anticoagulation. - Will have nursing staff ambulate with pulse ox. - Case mgt to provide list of PCPs in the area. - Continue percocet 7.5 mg-325 mg q4h PRN pain  Hypertension: BP elevated but within acceptable limits (140s-150s SBP/110's DBP). Likely due to pain. Will defer treatment to PCP.  Dispo: Anticipated discharge  today.  Synetta Shadow, MD 06/16/2018, 7:24 AM Pager: [phone]

## 2018-06-16 NOTE — Discharge Instructions (Signed)
Bleeding Precautions When on Anticoagulant Therapy, Adult Anticoagulant therapy, also called blood thinner therapy, is medicine that helps to prevent and treat blood clots. The medicine works by stopping blood clots from forming or growing. Blood clots that form in your blood vessels can be dangerous. They can break loose and travel to the heart, lungs, or brain. This increases the risk of a heart attack, stroke, or blocked lung artery (pulmonary embolism). Anticoagulants also increase the risk of bleeding. Try to protect yourself from cuts and other injuries that can cause bleeding. It is important to take anticoagulants exactly as told by your health care provider. Why do I need to be on anticoagulant therapy? You may need this medicine if you are at risk of developing a blood clot. Conditions that increase your risk of a blood clot include:  Being born with heart disease or a heart malformation (congenital heart disease).  Developing heart disease.  Having had surgery, such as valve replacement.  Having had a serious accident or other type of severe injury (trauma).  Having certain types of cancer.  Having certain diseases that can increase blood clotting.  Having a high risk of stroke or heart attack.  Having atrial fibrillation (AF). What are the common anticoagulant medicines? There are several types of anticoagulant medicines. The most common types are:  Medicines that you take by mouth (oral medicines), such as: ? Warfarin. ? Novel oral anticoagulants (NOACs), such as: ? Direct thrombin inhibitors (dabigatran). ? Factor Xa inhibitors (apixaban, edoxaban, and rivaroxaban).  Injections, such as: ? Unfractionated heparin. ? Low molecular weight heparin. These anticoagulants work in different ways to prevent blood clots. They also have different risks and side effects. What do I need to remember while on anticoagulant therapy? Taking anticoagulants  Take your medicine at the  same time every day. If you forget to take your medicine, take it as soon as you remember. Do not double your dosage of medicine if you miss a whole day. Take your normal dose and call your health care provider.  Do not stop taking your medicine unless your health care provider approves. Stopping the medicine can increase your risk of developing a blood clot. Taking other medicines  Take over-the-counter and prescriptions medicines only as told by your health care provider.  Do not take over-the-counter NSAIDs, including aspirin and ibuprofen, while you are on anticoagulant therapy. These medicines increase your risk of dangerous bleeding.  Get approval from your health care provider before you start taking any new medicines, vitamins, or herbal products. Some of these could interfere with your therapy. General instructions  Keep all follow-up visits as told by your health care provider. This is important.  If you are pregnant or trying to get pregnant, talk with a health care provider about anticoagulants. Some of these medicines are not safe to take during pregnancy.  Tell all health care providers, including your dentist, that you are on anticoagulant therapy. It is especially important to tell providers before you have any surgery, medical procedures, or dental work done. What precautions should I take?   Be very careful when using knives, scissors, or other sharp objects.  Use an electric razor instead of a blade.  Do not use toothpicks.  Use a soft-bristled toothbrush. Brush your teeth gently.  Always wear shoes outdoors and wear slippers indoors.  Be careful when cutting your fingernails and toenails.  Place bath mats in the bathroom. If possible, install handrails as well.  Wear gloves while you do   yard work.  Wear your seat belt.  Prevent falls by removing loose rugs and extension cords from areas where you walk. Use a cane or walker if you need it.  Avoid  constipation by: ? Drinking enough fluid to keep your urine clear or pale yellow. ? Eating foods that are high in fiber, such as fresh fruits and vegetables, whole grains, and beans. ? Limiting foods that are high in fat and processed sugars, such as fried and sweet foods.  Do not play contact sports or participate in other activities that have a high risk for injury. What other precautions are important if on warfarin therapy? If you are taking a type of anticoagulant called warfarin, make sure you:  Work with a diet and nutrition specialist (dietitian) to make an eating plan. Do not make any sudden changes to your diet after you have started your eating plan.  Do not drink alcohol. It can interfere with your medicine and increase your risk of an injury that causes bleeding.  Get regular blood tests as told by your health care provider. What are some questions to ask my health care provider?  Why do I need anticoagulant therapy?  What is the best anticoagulant therapy for my condition?  How long will I need anticoagulant therapy?  What are the side effects of anticoagulant therapy?  When should I take my medicine? What should I do if I forget to take it?  Will I need to have regular blood tests?  Do I need to change my diet? Are there foods or drinks that I should avoid?  What activities are safe for me?  What should I do if I want to get pregnant? Contact a health care provider if:  You miss a dose of medicine: ? And you are not sure what to do. ? For more than one day.  You have: ? Menstrual bleeding that is heavier than normal. ? Bloody or brown urine. ? Easy bruising. ? Black and tarry stool or bright red stool. ? Side effects from your medicine.  You feel weak or dizzy.  You become pregnant. Get help right away if:  You have bleeding that will not stop within 20 minutes from: ? The nose. ? The gums. ? A cut on the skin.  You have a severe headache or  stomachache.  You vomit or cough up blood.  You fall or hit your head. Summary  Anticoagulant therapy, also called blood thinner therapy, is medicine that helps to prevent and treat blood clots.  Anticoagulants work in different ways to prevent blood clots. They also have different risks and side effects.  Talk with your health care provider about any precautions that you should take while on anticoagulant therapy. This information is not intended to replace advice given to you by your health care provider. Make sure you discuss any questions you have with your health care provider. Document Released: 01/23/2015 Document Revised: 04/30/2016 Document Reviewed: 04/30/2016 Elsevier Interactive Patient Education  2019 Elsevier Inc.  

## 2018-06-16 NOTE — TOC Transition Note (Signed)
Transition of Care [facility]) - CM/SW Discharge Note   Patient Details  Name: Travis Reed MRN: [id number] Date of Birth: [birth year]  [facility] CM/SW Contact:  Leone Haven, RN Phone Number: 06/16/2018, 12:10 PM   Clinical Narrative:    From home with wife, has no insurance, no pcp, will need follow up apt at [facility] clinic and medication ast with [employer] and 30 day coupon for NOAC and TOC to fill scripts prior to dc, patient ast application for NOAC. Patient has no DME at home, wife will transport home at discharge.  4/21 Letha Cape RN, BSN - NCM spoke with patient, informed him the RN will bring him the patient assistance form for eliquis and he will need to take that to his follow up apt at the [facility] clinic, while he is waiting on patient asst he can get refill for 10.00 at the clinic.  TOC pharmacy is filling the 30 day free eliquis for him and if he has enough funds to pay for the percocet they will fill that also, if he does not have funds for the percocet he will need to go to a local pharmacy.   Final next level of care: Home/Self Care Barriers to Discharge: Inadequate or no insurance   Patient Goals and CMS Choice Patient states their goals for this hospitalization and ongoing recovery are:: (to get better)   Choice offered to / list presented to : NA  Discharge Placement                       Discharge Plan and Services In-house Referral: Financial Counselor Discharge Planning Services: Medication Assistance, [facility], Follow-up appt scheduled, CM Consult Post Acute Care Choice: NA          DME Arranged: N/A DME Agency: NA HH Arranged: NA HH Agency: NA   Social Determinants of Health (SDOH) Interventions     Readmission Risk Interventions Readmission Risk Prevention Plan 06/15/2018  Medication Screening Complete  Transportation Screening Complete  Some recent data might be hidden

## 2018-06-22 ENCOUNTER — Telehealth: Payer: Self-pay | Admitting: *Deleted

## 2018-06-22 NOTE — Telephone Encounter (Signed)
Received call from pt and states he lost paperwork for the Patient Assistance program for Eliquis. Explained to pt that the [facility] pharmacy will be able to assist him with completing application. Explained he will need to take with him information about income, recent taxes forms, paycheck stubs, etc. Isidoro Donning RN CCM Case Mgmt phone [phone]

## 2018-06-24 NOTE — Progress Notes (Signed)
Patient ID: Travis Reed, male   DOB: 1966-01-21, 53 y.o.   MRN: [id number] Virtual Visit via Telephone Note  I connected with Travis Reed on 06/25/18 at 11:10 AM EDT by telephone and verified that I am speaking with the correct person using two identifiers.   I discussed the limitations, risks, security and privacy concerns of performing an evaluation and management service by telephone and the availability of in person appointments. I also discussed with the patient that there may be a patient responsible charge related to this service. The patient expressed understanding and agreed to proceed.  Patient location: home My Location:  [facility] office Persons on the call:  Myself and the patient, and his wife, Nicole Cella  History of Present Illness:   after hospitalization 4/20-4/21 for unprovoked pulmonary embolism.  No SOB or CP.  Had some mild gum bleeding a few days ago but didn't last long.  No other bleeding.  Has finished his starter pack of Eliquis and is now on 5mg  bid.  Needs 6 months rf bc that's how long the discharge summary says he needs to be on it.   Smokes about 4 cigs/day  From discharge summary: Hospital Course by problem list: 1.  Unprovoked pulmonary embolism: Mr. Enge presented with pleuritic and positional chest pain.  He was found to have an elevated d-dimer and CTA confirmed pulmonary embolism.  He was started on IV heparin.  Vital signs remained stable throughout admission.  He was transitioned to a direct oral anticoagulant with Eliquis and instructed that he will need to take this medication for a minimum of 6 months.  2.  Elevated blood pressure: He was found to have elevated blood pressure throughout his admission.  His elevated blood pressure may be related to his pulmonary embolism and chest pain.  Please reassess his blood pressure at hospital follow-up and add antihypertensive as indicated.  Discharge Vitals:   BP (!) 136/98 (BP Location: Left Arm)   Pulse  69   Temp 97.8 F (36.6 C) (Oral)   Resp 16   Ht 6\' 3"  (1.905 m)   Wt 64.9 kg   SpO2 98%   BMI 17.88 kg/m   Pertinent Labs, Studies, and Procedures:  CT angios chest: IMPRESSION: Right lower lobe pulmonary emboli with associated changes of early pulmonary ischemia    Observations/Objective:  A&Ox3.  Tp linear.     Assessment and Plan: 1. Pulmonary embolism, other, unspecified chronicity, unspecified whether acute cor pulmonale present (HCC) Continue- - apixaban (ELIQUIS) 5 MG TABS tablet; Take 1 tablet (5 mg total) by mouth 2 (two) times daily.  Dispense: 60 tablet; Refill: 5  2. Elevated blood pressure reading Check BP OOO and record 3 times weekly and bring to next office visit.    3. Hospital discharge follow-up Much improved  4. Smoker Cessation advised   Follow Up Instructions:    I discussed the assessment and treatment plan with the patient. The patient was provided an opportunity to ask questions and all were answered. The patient agreed with the plan and demonstrated an understanding of the instructions.   The patient was advised to call back or seek an in-person evaluation if the symptoms worsen or if the condition fails to improve as anticipated.  I provided 12 minutes of non-face-to-face time during this encounter.   Georgian Co, PA-C

## 2018-06-25 ENCOUNTER — Other Ambulatory Visit: Payer: Self-pay

## 2018-06-25 ENCOUNTER — Ambulatory Visit: Payer: Self-pay | Attending: Family Medicine | Admitting: Physician Assistant

## 2018-06-25 ENCOUNTER — Encounter: Payer: Self-pay | Admitting: Physician Assistant

## 2018-06-25 DIAGNOSIS — I2699 Other pulmonary embolism without acute cor pulmonale: Secondary | ICD-10-CM

## 2018-06-25 DIAGNOSIS — Z09 Encounter for follow-up examination after completed treatment for conditions other than malignant neoplasm: Secondary | ICD-10-CM

## 2018-06-25 DIAGNOSIS — R030 Elevated blood-pressure reading, without diagnosis of hypertension: Secondary | ICD-10-CM

## 2018-06-25 DIAGNOSIS — F17200 Nicotine dependence, unspecified, uncomplicated: Secondary | ICD-10-CM

## 2018-06-25 MED ORDER — APIXABAN 5 MG PO TABS: 5.0000 mg | ORAL_TABLET | Freq: Two times a day (BID) | ORAL | 5 refills | Status: DC

## 2018-07-14 MED FILL — !ELIQUIS 5MG TABLET: 5 | 30 days supply | Qty: 60 | Fill #0

## 2018-07-21 ENCOUNTER — Ambulatory Visit: Payer: Self-pay | Attending: Family Medicine | Admitting: Family Medicine

## 2018-07-21 ENCOUNTER — Other Ambulatory Visit: Payer: Self-pay

## 2018-07-21 ENCOUNTER — Encounter: Payer: Self-pay | Admitting: Family Medicine

## 2018-07-21 VITALS — BP 131/82 | HR 92 | Temp 98.4°F | Wt 149.6 lb

## 2018-07-21 DIAGNOSIS — F17200 Nicotine dependence, unspecified, uncomplicated: Secondary | ICD-10-CM

## 2018-07-21 DIAGNOSIS — F17210 Nicotine dependence, cigarettes, uncomplicated: Secondary | ICD-10-CM

## 2018-07-21 DIAGNOSIS — Z1159 Encounter for screening for other viral diseases: Secondary | ICD-10-CM

## 2018-07-21 DIAGNOSIS — I2699 Other pulmonary embolism without acute cor pulmonale: Secondary | ICD-10-CM

## 2018-07-21 MED ORDER — NICOTINE 7 MG/24HR TD PT24: 7.0000 mg | MEDICATED_PATCH | Freq: Every day | TRANSDERMAL | 1 refills | Status: DC

## 2018-07-21 MED FILL — NICOTINE 7 MG/24HR PATCH: 7 | 28 days supply | Qty: 28 | Fill #0

## 2018-07-21 NOTE — Patient Instructions (Signed)
Steps to Quit Smoking    Smoking tobacco can be bad for your health. It can also affect almost every organ in your body. Smoking puts you and people around you at risk for many serious long-lasting (chronic) diseases. Quitting smoking is hard, but it is one of the best things that you can do for your health. It is never too late to quit.  What are the benefits of quitting smoking?  When you quit smoking, you lower your risk for getting serious diseases and conditions. They can include:  · Lung cancer or lung disease.  · Heart disease.  · Stroke.  · Heart attack.  · Not being able to have children (infertility).  · Weak bones (osteoporosis) and broken bones (fractures).  If you have coughing, wheezing, and shortness of breath, those symptoms may get better when you quit. You may also get sick less often. If you are pregnant, quitting smoking can help to lower your chances of having a baby of low birth weight.  What can I do to help me quit smoking?  Talk with your doctor about what can help you quit smoking. Some things you can do (strategies) include:  · Quitting smoking totally, instead of slowly cutting back how much you smoke over a period of time.  · Going to in-person counseling. You are more likely to quit if you go to many counseling sessions.  · Using resources and support systems, such as:  ? Online chats with a counselor.  ? Phone quitlines.  ? Printed self-help materials.  ? Support groups or group counseling.  ? Text messaging programs.  ? Mobile phone apps or applications.  · Taking medicines. Some of these medicines may have nicotine in them. If you are pregnant or breastfeeding, do not take any medicines to quit smoking unless your doctor says it is okay. Talk with your doctor about counseling or other things that can help you.  Talk with your doctor about using more than one strategy at the same time, such as taking medicines while you are also going to in-person counseling. This can help make  quitting easier.  What things can I do to make it easier to quit?  Quitting smoking might feel very hard at first, but there is a lot that you can do to make it easier. Take these steps:  · Talk to your family and friends. Ask them to support and encourage you.  · Call phone quitlines, reach out to support groups, or work with a counselor.  · Ask people who smoke to not smoke around you.  · Avoid places that make you want (trigger) to smoke, such as:  ? Bars.  ? Parties.  ? Smoke-break areas at work.  · Spend time with people who do not smoke.  · Lower the stress in your life. Stress can make you want to smoke. Try these things to help your stress:  ? Getting regular exercise.  ? Deep-breathing exercises.  ? Yoga.  ? Meditating.  ? Doing a body scan. To do this, close your eyes, focus on one area of your body at a time from head to toe, and notice which parts of your body are tense. Try to relax the muscles in those areas.  · Download or buy apps on your mobile phone or tablet that can help you stick to your quit plan. There are many free apps, such as QuitGuide from the CDC (Centers for Disease Control and Prevention). You can find more   support from smokefree.gov and other websites.  This information is not intended to replace advice given to you by your health care provider. Make sure you discuss any questions you have with your health care provider.  Document Released: 12/08/2008 Document Revised: 10/10/2015 Document Reviewed: 06/28/2014  Elsevier Interactive Patient Education © 2019 Elsevier Inc.

## 2018-07-21 NOTE — Progress Notes (Signed)
Subjective:  Patient ID: Travis ForemanJohn L Reed, male    DOB: [birth year]  Age: 53 y.o. MRN: [id number]  CC: Establish Care   HPI Travis Reed is a 53 year old male with a history of tobacco abuse diagnosed with pulmonary embolism (unprovoked PE) in 06/23/2018 and is currently on anticoagulation with Eliquis; plan was for 2828-month anticoagulation from his discharge summary. CT angiogram of the chest from 05/2018 revealed: IMPRESSION: Right lower lobe pulmonary emboli with associated changes of early pulmonary ischemia.  Today he denies bruising or bleeding with Eliquis use.  Both his parents are "on blood thinners" and his dad has a history of coronary artery disease status post stent placement however he is unsure of a history of thrombolic disease. He does have mild residual right-sided chest pain in his lower chest wall anteriorly from the time of diagnosis but he reports significant improvement. Denies dyspnea, wheezing, pedal edema. Smokes a pack of cigarette per week which is down from 1 pack in 3 days previously. He has no additional concerns today.  History reviewed. No pertinent past medical history.  Past Surgical History:  Procedure Laterality Date  . WRIST SURGERY Left     Family History  Problem Relation Age of Onset  . Diabetes Mother   . Hyperlipidemia Mother     Allergies  Allergen Reactions  . Penicillins Other (See Comments)    Unknown childhood reaction    Outpatient Medications Prior to Visit  Medication Sig Dispense Refill  . apixaban (ELIQUIS) 5 MG TABS tablet Take 1 tablet (5 mg total) by mouth 2 (two) times daily. 60 tablet 5  . oxyCODONE-acetaminophen (PERCOCET) 7.5-325 MG tablet Take 1 tablet by mouth every 4 (four) hours as needed for severe pain. (Patient not taking: Reported on 07/21/2018) 30 tablet 0   No facility-administered medications prior to visit.      ROS Review of Systems  Constitutional: Negative for activity change and appetite  change.  HENT: Negative for sinus pressure and sore throat.   Eyes: Negative for visual disturbance.  Respiratory: Negative for cough, chest tightness and shortness of breath.   Cardiovascular: Positive for chest pain. Negative for leg swelling.  Gastrointestinal: Negative for abdominal distention, abdominal pain, constipation and diarrhea.  Endocrine: Negative.   Genitourinary: Negative for dysuria.  Musculoskeletal: Negative for joint swelling and myalgias.  Skin: Negative for rash.  Allergic/Immunologic: Negative.   Neurological: Negative for weakness, light-headedness and numbness.  Psychiatric/Behavioral: Negative for dysphoric mood and suicidal ideas.    Objective:  BP 131/82   Pulse 92   Temp 98.4 F (36.9 C) (Oral)   Wt 149 lb 9.6 oz (67.9 kg)   SpO2 96%   BMI 18.70 kg/m   BP/Weight 07/21/2018 06/16/2018 06/15/2018  Systolic BP 131 139 -  Diastolic BP 82 98 -  Wt. (Lbs) 149.6 - 143.08  BMI 18.7 - 17.88      Physical Exam Constitutional:      Appearance: He is well-developed.  Cardiovascular:     Rate and Rhythm: Normal rate.     Heart sounds: Normal heart sounds. No murmur.  Pulmonary:     Effort: Pulmonary effort is normal.     Breath sounds: Normal breath sounds. No wheezing or rales.  Chest:     Chest wall: No tenderness.  Abdominal:     General: Bowel sounds are normal. There is no distension.     Palpations: Abdomen is soft. There is no mass.     Tenderness: There is no  abdominal tenderness.  Musculoskeletal: Normal range of motion.  Neurological:     Mental Status: He is alert and oriented to person, place, and time.     CMP Latest Ref Rng & Units 06/16/2018 06/15/2018 06/15/2018  Glucose 70 - 99 mg/dL 998(P) - -  BUN 6 - 20 mg/dL 8 - -  Creatinine 3.82 - 1.24 mg/dL 5.05 3.97(Q) -  Sodium 135 - 145 mmol/L 136 - 142  Potassium 3.5 - 5.1 mmol/L 3.3(L) - 3.7  Chloride 98 - 111 mmol/L 99 - -  CO2 22 - 32 mmol/L 27 - -  Calcium 8.9 - 10.3 mg/dL 7.3(A)  - -  Total Protein 6.5 - 8.1 g/dL 6.2(L) - -  Total Bilirubin 0.3 - 1.2 mg/dL 0.7 - -  Alkaline Phos 38 - 126 U/L 83 - -  AST 15 - 41 U/L 32 - -  ALT 0 - 44 U/L 23 - -    Lipid Panel  No results found for: CHOL, TRIG, HDL, CHOLHDL, VLDL, LDLCALC, LDLDIRECT  CBC    Component Value Date/Time   WBC 9.8 06/16/2018 0013   RBC 5.80 06/16/2018 0013   HGB 13.3 06/16/2018 0013   HCT 41.0 06/16/2018 0013   PLT 392 06/16/2018 0013   MCV 70.7 (L) 06/16/2018 0013   MCH 22.9 (L) 06/16/2018 0013   MCHC 32.4 06/16/2018 0013   RDW 16.5 (H) 06/16/2018 0013   LYMPHSABS 1.8 06/15/2018 0534   MONOABS 1.3 (H) 06/15/2018 0534   EOSABS 0.1 06/15/2018 0534   BASOSABS 0.1 06/15/2018 0534    No results found for: HGBA1C  Assessment & Plan:   1. Pulmonary embolism, other, unspecified chronicity, unspecified whether acute cor pulmonale present (HCC) Unprovoked PE Continue Eliquis Plan at discharge was for anticoagulation with 6 months which should be till 11/2018 Advised to inquire with his family about a family history of thromboembolic disease which will necessitate lifelong anticoagulation  2. Smoker Spent 3 minutes counseling on smoking cessation and he is willing to work on quitting - nicotine (NICODERM CQ - DOSED IN MG/24 HR) 7 mg/24hr patch; Place 1 patch (7 mg total) onto the skin daily.  Dispense: 28 patch; Refill: 1  3. Need for hepatitis C screening test - Hepatitis c antibody (reflex)  We will hold off on colonoscopy at this time given recent PE and he is currently on anticoagulation.  We will revisit this at his next visit.  Meds ordered this encounter  Medications  . nicotine (NICODERM CQ - DOSED IN MG/24 HR) 7 mg/24hr patch    Sig: Place 1 patch (7 mg total) onto the skin daily.    Dispense:  28 patch    Refill:  1    Follow-up: Return in about 5 months (around 12/21/2018).       Hoy Register, MD, FAAFP. [facility] [place],  [place] [phone]   07/21/2018, 10:38 AM

## 2018-07-22 LAB — HCV COMMENT:

## 2018-07-22 LAB — HEPATITIS C ANTIBODY (REFLEX): HCV Ab: <0.1 s/co ratio (ref 0.0–0.9)

## 2018-07-24 ENCOUNTER — Telehealth: Payer: Self-pay

## 2018-07-24 NOTE — Telephone Encounter (Signed)
Patient name and DOB has been verified Patient was informed of lab results. Patient had no questions.  

## 2018-07-24 NOTE — Telephone Encounter (Signed)
-----   Message from Hoy Register, MD sent at 07/22/2018  9:24 AM EDT ----- Please inform the patient that labs are normal. Thank you.

## 2018-08-06 MED FILL — !ELIQUIS 5MG TABLET: 5 | 30 days supply | Qty: 60 | Fill #1

## 2018-08-27 ENCOUNTER — Telehealth: Payer: Self-pay | Admitting: Family Medicine

## 2018-08-27 NOTE — Telephone Encounter (Signed)
Will route to PCP for review. 

## 2018-08-27 NOTE — Telephone Encounter (Signed)
Patient was called and a message was left with wife for patient to contact office.

## 2018-08-27 NOTE — Telephone Encounter (Signed)
Pt states since he started taking Eliquis he has had teeth bleeding pt states has never experienced this problem in the past.

## 2018-08-27 NOTE — Telephone Encounter (Signed)
Please inquire if he has been taking medications like Aleve, ibuprofen as the interaction with Eliquis can increase the bleeding.

## 2018-09-01 ENCOUNTER — Telehealth: Payer: Self-pay | Admitting: Family Medicine

## 2018-09-01 ENCOUNTER — Encounter (HOSPITAL_COMMUNITY): Payer: Self-pay | Admitting: Emergency Medicine

## 2018-09-01 ENCOUNTER — Emergency Department (HOSPITAL_COMMUNITY): Payer: Self-pay

## 2018-09-01 ENCOUNTER — Other Ambulatory Visit: Payer: Self-pay

## 2018-09-01 ENCOUNTER — Inpatient Hospital Stay (HOSPITAL_COMMUNITY)
Admission: EM | Admit: 2018-09-01 | Discharge: 2018-09-03 | DRG: 206 | Disposition: A | Discharge status: Home / Self Care | Payer: Self-pay | Attending: Internal Medicine | Admitting: Internal Medicine

## 2018-09-01 DIAGNOSIS — F10920 Alcohol use, unspecified with intoxication, uncomplicated: Secondary | ICD-10-CM

## 2018-09-01 DIAGNOSIS — I10 Essential (primary) hypertension: Secondary | ICD-10-CM | POA: Diagnosis present

## 2018-09-01 DIAGNOSIS — Z1159 Encounter for screening for other viral diseases: Secondary | ICD-10-CM

## 2018-09-01 DIAGNOSIS — Z8673 Personal history of transient ischemic attack (TIA), and cerebral infarction without residual deficits: Secondary | ICD-10-CM

## 2018-09-01 DIAGNOSIS — R569 Unspecified convulsions: Secondary | ICD-10-CM | POA: Diagnosis present

## 2018-09-01 DIAGNOSIS — F17210 Nicotine dependence, cigarettes, uncomplicated: Secondary | ICD-10-CM | POA: Diagnosis present

## 2018-09-01 DIAGNOSIS — Z79899 Other long term (current) drug therapy: Secondary | ICD-10-CM

## 2018-09-01 DIAGNOSIS — Z880 Allergy status to penicillin: Secondary | ICD-10-CM

## 2018-09-01 DIAGNOSIS — R079 Chest pain, unspecified: Secondary | ICD-10-CM | POA: Diagnosis present

## 2018-09-01 DIAGNOSIS — F10220 Alcohol dependence with intoxication, uncomplicated: Secondary | ICD-10-CM | POA: Diagnosis present

## 2018-09-01 DIAGNOSIS — R55 Syncope and collapse: Secondary | ICD-10-CM

## 2018-09-01 DIAGNOSIS — M940 Chondrocostal junction syndrome [Tietze]: Principal | ICD-10-CM | POA: Diagnosis present

## 2018-09-01 DIAGNOSIS — Y908 Blood alcohol level of 240 mg/100 ml or more: Secondary | ICD-10-CM | POA: Diagnosis present

## 2018-09-01 DIAGNOSIS — Z7901 Long term (current) use of anticoagulants: Secondary | ICD-10-CM

## 2018-09-01 DIAGNOSIS — M79602 Pain in left arm: Secondary | ICD-10-CM | POA: Diagnosis present

## 2018-09-01 DIAGNOSIS — F17200 Nicotine dependence, unspecified, uncomplicated: Secondary | ICD-10-CM | POA: Diagnosis present

## 2018-09-01 DIAGNOSIS — F10929 Alcohol use, unspecified with intoxication, unspecified: Secondary | ICD-10-CM | POA: Diagnosis present

## 2018-09-01 DIAGNOSIS — Z86711 Personal history of pulmonary embolism: Secondary | ICD-10-CM

## 2018-09-01 DIAGNOSIS — R918 Other nonspecific abnormal finding of lung field: Secondary | ICD-10-CM

## 2018-09-01 DIAGNOSIS — M79601 Pain in right arm: Secondary | ICD-10-CM | POA: Diagnosis present

## 2018-09-01 DIAGNOSIS — I471 Supraventricular tachycardia: Secondary | ICD-10-CM | POA: Diagnosis present

## 2018-09-01 DIAGNOSIS — I2699 Other pulmonary embolism without acute cor pulmonale: Secondary | ICD-10-CM | POA: Diagnosis present

## 2018-09-01 HISTORY — DX: Other pulmonary embolism without acute cor pulmonale: I26.99

## 2018-09-01 HISTORY — DX: Other nonspecific abnormal finding of lung field: R91.8

## 2018-09-01 HISTORY — DX: Chest pain, unspecified: R07.9

## 2018-09-01 HISTORY — DX: Essential (primary) hypertension: I10

## 2018-09-01 HISTORY — DX: Alcohol abuse, uncomplicated: F10.10

## 2018-09-01 LAB — URINALYSIS, ROUTINE W REFLEX MICROSCOPIC
Bilirubin Urine: NEGATIVE
Glucose, UA: NEGATIVE mg/dL
Hgb urine dipstick: NEGATIVE
Ketones, ur: NEGATIVE mg/dL
Leukocytes,Ua: NEGATIVE
Nitrite: NEGATIVE
Protein, ur: NEGATIVE mg/dL
Specific Gravity, Urine: 1.002 — ABNORMAL LOW (ref 1.005–1.030)
pH: 6 (ref 5.0–8.0)

## 2018-09-01 LAB — CBC WITH DIFFERENTIAL/PLATELET
Abs Immature Granulocytes: 0.02 10*3/uL (ref 0.00–0.07)
Basophils Absolute: 0 10*3/uL (ref 0.0–0.1)
Basophils Relative: 0 %
Eosinophils Absolute: 0 10*3/uL (ref 0.0–0.5)
Eosinophils Relative: 0 %
HCT: 43.6 % (ref 39.0–52.0)
Hemoglobin: 14.5 g/dL (ref 13.0–17.0)
Immature Granulocytes: 0 %
Lymphocytes Relative: 28 %
Lymphs Abs: 2.1 10*3/uL (ref 0.7–4.0)
MCH: 21.9 pg — ABNORMAL LOW (ref 26.0–34.0)
MCHC: 33.3 g/dL (ref 30.0–36.0)
MCV: 65.8 fL — ABNORMAL LOW (ref 80.0–100.0)
Monocytes Absolute: 0.8 10*3/uL (ref 0.1–1.0)
Monocytes Relative: 10 %
Neutro Abs: 4.7 10*3/uL (ref 1.7–7.7)
Neutrophils Relative %: 62 %
Platelets: 391 10*3/uL (ref 150–400)
RBC: 6.63 MIL/uL — ABNORMAL HIGH (ref 4.22–5.81)
RDW: 20.5 % — ABNORMAL HIGH (ref 11.5–15.5)
WBC: 7.7 10*3/uL (ref 4.0–10.5)
nRBC: 0 % (ref 0.0–0.2)

## 2018-09-01 LAB — COMPREHENSIVE METABOLIC PANEL
ALT: 20 U/L (ref 0–44)
AST: 29 U/L (ref 15–41)
Albumin: 4.1 g/dL (ref 3.5–5.0)
Alkaline Phosphatase: 73 U/L (ref 38–126)
Anion gap: 14 (ref 5–15)
BUN: 8 mg/dL (ref 6–20)
CO2: 21 mmol/L — ABNORMAL LOW (ref 22–32)
Calcium: 9.5 mg/dL (ref 8.9–10.3)
Chloride: 103 mmol/L (ref 98–111)
Creatinine, Ser: 1.03 mg/dL (ref 0.61–1.24)
GFR calc Af Amer: >60 mL/min (ref >60)
GFR calc non Af Amer: >60 mL/min (ref >60)
Glucose, Bld: 65 mg/dL — ABNORMAL LOW (ref 70–99)
Potassium: 3.7 mmol/L (ref 3.5–5.1)
Sodium: 138 mmol/L (ref 135–145)
Total Bilirubin: 1.1 mg/dL (ref 0.3–1.2)
Total Protein: 7.7 g/dL (ref 6.5–8.1)

## 2018-09-01 LAB — TROPONIN I (HIGH SENSITIVITY): Troponin I (High Sensitivity): 4 ng/L (ref <18)

## 2018-09-01 LAB — RAPID URINE DRUG SCREEN, HOSP PERFORMED
Amphetamines: NOT DETECTED
Barbiturates: NOT DETECTED
Benzodiazepines: NOT DETECTED
Cocaine: NOT DETECTED
Opiates: NOT DETECTED
Tetrahydrocannabinol: POSITIVE — AB

## 2018-09-01 LAB — PROTIME-INR
INR: 1 (ref 0.8–1.2)
Prothrombin Time: 13.3 seconds (ref 11.4–15.2)

## 2018-09-01 LAB — CBG MONITORING, ED
Glucose-Capillary: 86 mg/dL (ref 70–99)
Glucose-Capillary: 71 mg/dL (ref 70–99)

## 2018-09-01 LAB — ETHANOL: Alcohol, Ethyl (B): 265 mg/dL — ABNORMAL HIGH (ref <10)

## 2018-09-01 LAB — SARS CORONAVIRUS 2 BY RT PCR (HOSPITAL ORDER, PERFORMED IN ~~LOC~~ HOSPITAL LAB): SARS Coronavirus 2: NEGATIVE

## 2018-09-01 MED ORDER — LORAZEPAM 2 MG/ML IJ SOLN: 1.0000 mg | Freq: Four times a day (QID) | INTRAMUSCULAR | Status: DC | PRN

## 2018-09-01 MED ORDER — SODIUM CHLORIDE 0.9% FLUSH
3.0000 mL | Freq: Two times a day (BID) | INTRAVENOUS | Status: DC
  Administered 2018-09-02 – 2018-09-03 (×3): 3 mL via INTRAVENOUS

## 2018-09-01 MED ORDER — ACETAMINOPHEN 650 MG RE SUPP: 650.0000 mg | Freq: Four times a day (QID) | RECTAL | Status: DC | PRN

## 2018-09-01 MED ORDER — THIAMINE HCL 100 MG/ML IJ SOLN
100.0000 mg | Freq: Every day | INTRAMUSCULAR | Status: DC
  Administered 2018-09-02: 100 mg via INTRAVENOUS
  Filled 2018-09-01: qty 2

## 2018-09-01 MED ORDER — IOHEXOL 350 MG/ML SOLN
100.0000 mL | Freq: Once | INTRAVENOUS | Status: AC | PRN
  Administered 2018-09-01: 100 mL via INTRAVENOUS

## 2018-09-01 MED ORDER — ENOXAPARIN SODIUM 40 MG/0.4ML ~~LOC~~ SOLN: 40.0000 mg | SUBCUTANEOUS | Status: DC

## 2018-09-01 MED ORDER — NICOTINE 14 MG/24HR TD PT24
14.0000 mg | MEDICATED_PATCH | Freq: Once | TRANSDERMAL | Status: AC
  Administered 2018-09-01: 14 mg via TRANSDERMAL
  Filled 2018-09-01: qty 1

## 2018-09-01 MED ORDER — VITAMIN B-1 100 MG PO TABS
100.0000 mg | ORAL_TABLET | Freq: Every day | ORAL | Status: DC
  Administered 2018-09-03: 100 mg via ORAL
  Filled 2018-09-01 (×2): qty 1

## 2018-09-01 MED ORDER — ONDANSETRON HCL 4 MG/2ML IJ SOLN: 4.0000 mg | Freq: Four times a day (QID) | INTRAMUSCULAR | Status: DC | PRN

## 2018-09-01 MED ORDER — ONDANSETRON HCL 4 MG PO TABS: 4.0000 mg | ORAL_TABLET | Freq: Four times a day (QID) | ORAL | Status: DC | PRN

## 2018-09-01 MED ORDER — ALUM & MAG HYDROXIDE-SIMETH 200-200-20 MG/5ML PO SUSP
30.0000 mL | Freq: Once | ORAL | Status: AC
  Administered 2018-09-01: 30 mL via ORAL
  Filled 2018-09-01: qty 30

## 2018-09-01 MED ORDER — NICOTINE 14 MG/24HR TD PT24
14.0000 mg | MEDICATED_PATCH | Freq: Every day | TRANSDERMAL | Status: DC
  Administered 2018-09-02 – 2018-09-03 (×2): 14 mg via TRANSDERMAL
  Filled 2018-09-01 (×2): qty 1

## 2018-09-01 MED ORDER — ADULT MULTIVITAMIN W/MINERALS CH
1.0000 | ORAL_TABLET | Freq: Every day | ORAL | Status: DC
  Administered 2018-09-02 – 2018-09-03 (×2): 1 via ORAL
  Filled 2018-09-01 (×2): qty 1

## 2018-09-01 MED ORDER — APIXABAN 5 MG PO TABS
5.0000 mg | ORAL_TABLET | Freq: Two times a day (BID) | ORAL | Status: DC
  Administered 2018-09-02 – 2018-09-03 (×4): 5 mg via ORAL
  Filled 2018-09-01 (×5): qty 1

## 2018-09-01 MED ORDER — LORAZEPAM 1 MG PO TABS
1.0000 mg | ORAL_TABLET | Freq: Four times a day (QID) | ORAL | Status: DC | PRN
  Administered 2018-09-02: 1 mg via ORAL
  Filled 2018-09-01: qty 1

## 2018-09-01 MED ORDER — NICOTINE 7 MG/24HR TD PT24: 7.0000 mg | MEDICATED_PATCH | Freq: Every day | TRANSDERMAL | Status: DC

## 2018-09-01 MED ORDER — LORAZEPAM 2 MG/ML IJ SOLN
2.0000 mg | Freq: Once | INTRAMUSCULAR | Status: AC
  Administered 2018-09-01: 2 mg via INTRAVENOUS
  Filled 2018-09-01: qty 1

## 2018-09-01 MED ORDER — FOLIC ACID 1 MG PO TABS
1.0000 mg | ORAL_TABLET | Freq: Every day | ORAL | Status: DC
  Administered 2018-09-02 – 2018-09-03 (×2): 1 mg via ORAL
  Filled 2018-09-01 (×2): qty 1

## 2018-09-01 MED ORDER — ACETAMINOPHEN 325 MG PO TABS
650.0000 mg | ORAL_TABLET | Freq: Four times a day (QID) | ORAL | Status: DC | PRN
  Administered 2018-09-03: 650 mg via ORAL
  Filled 2018-09-01: qty 2

## 2018-09-01 MED ORDER — MORPHINE SULFATE (PF) 2 MG/ML IV SOLN
2.0000 mg | INTRAVENOUS | Status: DC | PRN
  Administered 2018-09-01 – 2018-09-03 (×6): 2 mg via INTRAVENOUS
  Filled 2018-09-01 (×6): qty 1

## 2018-09-01 MED FILL — ELIQUIS 5 MG TABLET: 5 | 30 days supply | Qty: 60 | Fill #2

## 2018-09-01 NOTE — ED Notes (Signed)
Pt requesting pain meds, EDP aware.

## 2018-09-01 NOTE — H&P (Signed)
History and Physical    Travis Reed [id number] DOB: May 23, 1965 DOA: 09/01/2018  PCP: [name], [name], MD  Patient coming from: Home  I have personally briefly reviewed patient's old medical [facility]  Chief Complaint: CP, syncope  HPI: Travis Reed is a 53 y.o. male with medical history significant of PE back in April.  Ongoing heavy EtOH use / abuse.  Patient presents to the ED with c/o CP.  Patient with intermittent CP and B arm pain since April admission.  Had some onset tonight after going out and drinking EtOH.  And wife had him come in to ED.   ED Course: While in triage patient had witnessed syncopal episode, woke back up, was reportedly diaphoretic.  Then had second episode with questionable seizure like activity reported by staff.  No incontinence during episode.  Patient has no PMH of seizures.  Initially very confused after waking up he is now back to baseline.  CT head neg, CTA chest shows no new PE, does show BUL pulmonary nodules they rec repeat in 3-6 months for.  Trop neg x1   Review of Systems: As per HPI otherwise 10 point review of systems negative.   History reviewed. No pertinent past medical history.  Past Surgical History:  Procedure Laterality Date   WRIST SURGERY Left      reports that he has been smoking cigarettes. He has been smoking about 0.50 packs per day. He has never used smokeless tobacco. He reports current alcohol use. He reports current drug use. Drug: Marijuana.  Allergies  Allergen Reactions   Penicillins Other (See Comments)    Unknown childhood reaction    Family History  Problem Relation Age of Onset   Diabetes Mother    Hyperlipidemia Mother      Prior to Admission medications   Medication Sig Start Date End Date Taking? Authorizing Provider  apixaban (ELIQUIS) 5 MG TABS tablet Take 1 tablet (5 mg total) by mouth 2 (two) times daily. 06/25/18   Anders SimmondsMcClung, Angela M, PA-C  nicotine (NICODERM CQ  - DOSED IN MG/24 HR) 7 mg/24hr patch Place 1 patch (7 mg total) onto the skin daily. 07/21/18   [name], [name], MD    Physical Exam: Vitals:   09/01/18 1845 09/01/18 2100 09/01/18 2145 09/01/18 2209  BP: 107/88 113/85 129/89 125/90  Pulse: 92 83 84 85  Resp: 19 18  18   Temp:      TempSrc:      SpO2: 98% 98% 95% 96%  Weight:      Height:        Constitutional: NAD, calm, comfortable Eyes: PERRL, lids and conjunctivae normal ENMT: Mucous membranes are moist. Posterior pharynx clear of any exudate or lesions.Normal dentition.  Neck: normal, supple, no masses, no thyromegaly Respiratory: clear to auscultation bilaterally, no wheezing, no crackles. Normal respiratory effort. No accessory muscle use.  Cardiovascular: Regular rate and rhythm, no murmurs / rubs / gallops. No extremity edema. 2+ pedal pulses. No carotid bruits.  Abdomen: no tenderness, no masses palpated. No hepatosplenomegaly. Bowel sounds positive.  Musculoskeletal: no clubbing / cyanosis. No joint deformity upper and lower extremities. Good ROM, no contractures. Normal muscle tone.  Skin: no rashes, lesions, ulcers. No induration Neurologic: CN 2-12 grossly intact. Sensation intact, DTR normal. Strength 5/5 in all 4.  Psychiatric: Normal judgment and insight. Alert and oriented x 3. Normal mood.    Labs on Admission: I have personally reviewed following labs and imaging studies  CBC:  Recent Labs  Lab 09/01/18 1700  WBC 7.7  NEUTROABS 4.7  HGB 14.5  HCT 43.6  MCV 65.8*  PLT 315   Basic Metabolic Panel: Recent Labs  Lab 09/01/18 1700  NA 138  K 3.7  CL 103  CO2 21*  GLUCOSE 65*  BUN 8  CREATININE 1.03  CALCIUM 9.5   GFR: Estimated Creatinine Clearance: 94.2 mL/min (by C-G formula based on SCr of 1.03 mg/dL). Liver Function Tests: Recent Labs  Lab 09/01/18 1700  AST 29  ALT 20  ALKPHOS 73  BILITOT 1.1  PROT 7.7  ALBUMIN 4.1   No results for input(s): LIPASE, AMYLASE in the last 168  hours. No results for input(s): AMMONIA in the last 168 hours. Coagulation Profile: Recent Labs  Lab 09/01/18 1700  INR 1.0   Cardiac Enzymes: No results for input(s): CKTOTAL, CKMB, CKMBINDEX, TROPONINI in the last 168 hours. BNP (last 3 results) No results for input(s): PROBNP in the last 8760 hours. HbA1C: No results for input(s): HGBA1C in the last 72 hours. CBG: Recent Labs  Lab 09/01/18 1634 09/01/18 1813  GLUCAP 86 71   Lipid Profile: No results for input(s): CHOL, HDL, LDLCALC, TRIG, CHOLHDL, LDLDIRECT in the last 72 hours. Thyroid Function Tests: No results for input(s): TSH, T4TOTAL, FREET4, T3FREE, THYROIDAB in the last 72 hours. Anemia Panel: No results for input(s): VITAMINB12, FOLATE, FERRITIN, TIBC, IRON, RETICCTPCT in the last 72 hours. Urine analysis:    Component Value Date/Time   COLORURINE STRAW (A) 09/01/2018 1633   APPEARANCEUR CLEAR 09/01/2018 1633   LABSPEC 1.002 (L) 09/01/2018 1633   PHURINE 6.0 09/01/2018 1633   GLUCOSEU NEGATIVE 09/01/2018 1633   HGBUR NEGATIVE 09/01/2018 1633   BILIRUBINUR NEGATIVE 09/01/2018 1633   KETONESUR NEGATIVE 09/01/2018 1633   PROTEINUR NEGATIVE 09/01/2018 1633   NITRITE NEGATIVE 09/01/2018 1633   LEUKOCYTESUR NEGATIVE 09/01/2018 1633    Radiological Exams on Admission: Ct Head Wo Contrast  Result Date: 09/01/2018 CLINICAL DATA:  Convulsions EXAM: CT HEAD WITHOUT CONTRAST TECHNIQUE: Contiguous axial images were obtained from the base of the skull through the vertex without intravenous contrast. COMPARISON:  10/22/2011 FINDINGS: Brain: No evidence of acute infarction, hemorrhage, hydrocephalus, extra-axial collection or mass lesion/mass effect. Vascular: No hyperdense vessel or unexpected calcification. Skull: Normal. Negative for fracture or focal lesion. Sinuses/Orbits: No acute finding. Other: None. IMPRESSION: No acute intracranial pathology. Electronically Signed   By: Eddie Candle M.D.   On: 09/01/2018 17:56    Ct Angio Chest Pe W And/or Wo Contrast  Result Date: 09/01/2018 CLINICAL DATA:  Chest pain for 2 days. EXAM: CT ANGIOGRAPHY CHEST WITH CONTRAST TECHNIQUE: Multidetector CT imaging of the chest was performed using the standard protocol during bolus administration of intravenous contrast. Multiplanar CT image reconstructions and MIPs were obtained to evaluate the vascular anatomy. CONTRAST:  155mL OMNIPAQUE IOHEXOL 350 MG/ML SOLN COMPARISON:  The current chest radiograph. Prior chest CTA, 06/15/2018. FINDINGS: Cardiovascular: Satisfactory opacification of the pulmonary arteries to the segmental level. No evidence of pulmonary embolism. Normal heart size. No pericardial effusion. No coronary artery calcifications. No aortic aneurysm. No aortic atherosclerosis. Mediastinum/Nodes: No enlarged mediastinal, hilar, or axillary lymph nodes. Thyroid gland, trachea, and esophagus demonstrate no significant findings. Lungs/Pleura: Irregular nodular opacities noted both apices, with reticular/linear opacities extending to the pleura, 8 mm on the right, image 27, series 6, and 9 mm on the left, image 25, series 6. These are most likely due to scarring. Discoid type opacity along the inferior right oblique  fissure, likely atelectasis, measuring 13 x 6 mm in greatest transverse dimension. Minimal dependent atelectasis in the lower lobes. Lungs otherwise clear with no evidence of pneumonia or pulmonary edema. No pleural effusion or pneumothorax. Upper Abdomen: No acute abnormality. Musculoskeletal: No chest wall abnormality. No acute or significant osseous findings. Review of the MIP images confirms the above findings. IMPRESSION: 1. No evidence of a pulmonary embolism. 2. No acute findings. 3. Subcentimeter irregular nodular opacities in both upper lobes near the apices, most likely scarring. Non-contrast chest CT at 3-6 months is recommended. If the nodules are stable at time of repeat CT, then future CT at 18-24 months (from  today's scan) is considered optional for low-risk patients, but is recommended for high-risk patients. This recommendation follows the consensus statement: Guidelines for Management of Incidental Pulmonary Nodules Detected on CT Images: From the Fleischner Society 2017; Radiology 2017; 284:228-243. Electronically Signed   By: Amie Portlandavid  Ormond M.D.   On: 09/01/2018 19:55   Dg Chest Portable 1 View  Result Date: 09/01/2018 CLINICAL DATA:  Shortness of breath EXAM: PORTABLE CHEST 1 VIEW COMPARISON:  06/15/2018 FINDINGS: Cardiac shadows within normal limits. Lungs are well aerated bilaterally. Previously seen changes in the right lower lobe are not well appreciated on this exam. No bony abnormality is seen. IMPRESSION: No acute abnormality noted. Electronically Signed   By: Alcide CleverMark  Lukens M.D.   On: 09/01/2018 16:44    EKG: Independently reviewed.  Assessment/Plan Principal Problem:   Syncope Active Problems:   Pulmonary embolism (HCC)   Alcohol intoxication (HCC)   Pulmonary nodules   Chest pain    1. Syncope - with ? Seizure activity 1. Syncope pathway 2. Tele monitor 3. EEG 4. 2d echo 5. Seizure precautions 2. CP - 1. Serial trops 2. Tele monitor 3. Have cards eval after 2d echo performed most likely (assuming trops remain neg) 4. Morphine PRN 3. H/o PE - 1. Continue eliquis 4. Pulmonary nodules - 1. Needs follow up CT chest in 3-6 months 5. EtOH intoxication / abuse - 1. CIWA 2. Discussed need for medication if hes planning on quitting and risk of withdrawal (including potentially fatal withdrawal syndrome) with patient and wife  DVT prophylaxis: Eliquis Code Status: Full Family Communication: Wife at bedside Disposition Plan: Home after admit Consults called: None Admission status: Place in 76obs    Ashli Selders M. DO [facility]  How to contact the [facility] Attending or Consulting provider 7A - 7P or covering provider during after hours 7P -7A, for this patient?   1. Check the care team in [facility] and look for a) attending/consulting [facility] provider listed and b) the [facility] team listed 2. Log into [email or web address]  Amion Physician Scheduling and messaging for groups and whole hospitals  On call and physician scheduling software for group practices, residents, hospitalists and other medical providers for call, clinic, rotation and shift schedules. OnCall Enterprise is a hospital-wide system for scheduling doctors and paging doctors on call. EasyPlot is for scientific plotting and data analysis.  [email or web address]  and use [place]'s universal password to access. If you do not have the password, please contact the hospital operator.  3. Locate the [facility] provider you are looking for under [facility] and page to a number that you can be directly reached. 4. If you still have difficulty reaching the provider, please page the [facility] (Director on Call) for the Hospitalists listed on amion for assistance.  09/01/2018, 10:41 PM

## 2018-09-01 NOTE — ED Notes (Signed)
This RN went outside to help 2 emts get pt from car. When I arrived pt was in wheelchair extremely diaphoretic and not responding to any questions or commands. Upon sternal rub pt would groan and had a strong carotid pulse. Once in the ED pt began to have convulsions failing his arms and legs all around. Pt was then moved to a stretcher to be taken back to a treatment room.

## 2018-09-01 NOTE — ED Provider Notes (Signed)
[facility] Provider Note   CSN: [id number] Arrival date & time: 09/01/18  1622    History   Chief Complaint Chief Complaint  Patient presents with   Chest Pain    HPI Travis Reed is a 53 y.o. male.     HPI Level 5 caveat due to altered mental status. Patient reportedly was coming to the ER for chest pain.  Patient's wife said she went out and when she came back he had had a couple beers but was confused.  Complaining of chest pain.  Went unresponsive up in triage.  Woke back up.  Was reportedly diaphoretic.  Then had a second episode where he went unresponsive shock and potentially had a seizure witnessed by staff but not by me.  Upon arrival to the trauma room he was confused and said he just wants to leave.  Cannot really tell me what he came for.  Patient's wife came back to provide more history.  She then tried to take off his restraints.  Stated that she is a Clinical research associatelawyer and we do not free him she will sue all of us.  Patient reported history of pulmonary embolism.  On anticoagulation.    History reviewed. No pertinent past medical history.  Patient Active Problem List   Diagnosis Date Noted   Smoker 06/25/2018   Pulmonary embolism (HCC) 06/15/2018    Past Surgical History:  Procedure Laterality Date   WRIST SURGERY Left         Home Medications    Prior to Admission medications   Medication Sig Start Date End Date Taking? Authorizing Provider  apixaban (ELIQUIS) 5 MG TABS tablet Take 1 tablet (5 mg total) by mouth 2 (two) times daily. 06/25/18   Anders SimmondsMcClung, Angela M, PA-C  nicotine (NICODERM CQ - DOSED IN MG/24 HR) 7 mg/24hr patch Place 1 patch (7 mg total) onto the skin daily. 07/21/18   [name], [name], MD  oxyCODONE-acetaminophen (PERCOCET) 7.5-325 MG tablet Take 1 tablet by mouth every 4 (four) hours as needed for severe pain. Patient not taking: Reported on 07/21/2018 06/16/18   Synetta ShadowPrince, Jamie M, MD    Family  History Family History  Problem Relation Age of Onset   Diabetes Mother    Hyperlipidemia Mother     Social History Social History   Tobacco Use   Smoking status: Current Every Day Smoker    Packs/day: 0.50    Types: Cigarettes   Smokeless tobacco: Never Used  Substance Use Topics   Alcohol use: Yes    Comment: occasinal   Drug use: Yes    Types: Marijuana     Allergies   Penicillins   Review of Systems Review of Systems   Physical Exam Updated Vital Signs BP 107/88    Pulse 92    Temp (!) 97.3 F (36.3 C) (Temporal)    Resp 19    Ht 6\' 2"  (1.88 m)    Wt 83.9 kg    SpO2 98%    BMI 23.75 kg/m   Physical Exam Vitals signs and nursing note reviewed.  Constitutional:      Comments: Patient is somewhat agitated.  HENT:     Head: Normocephalic and atraumatic.  Eyes:     Extraocular Movements: Extraocular movements intact.  Cardiovascular:     Comments: Tachycardia Pulmonary:     Effort: No tachypnea.  Abdominal:     Tenderness: There is no abdominal tenderness. There is no guarding.  Skin:  Capillary Refill: Capillary refill takes less than 2 seconds.  Neurological:     Mental Status: He is alert.     Comments: Awake, but somewhat confused.  Somewhat combative.  States he just wants to leave  Psychiatric:        Mood and Affect: Mood normal.      ED Treatments / Results  Labs (all labs ordered are listed, but only abnormal results are displayed) Labs Reviewed  COMPREHENSIVE METABOLIC PANEL - Abnormal; Notable for the following components:      Result Value   CO2 21 (*)    Glucose, Bld 65 (*)    All other components within normal limits  ETHANOL - Abnormal; Notable for the following components:   Alcohol, Ethyl (B) 265 (*)    All other components within normal limits  RAPID URINE DRUG SCREEN, HOSP PERFORMED - Abnormal; Notable for the following components:   Tetrahydrocannabinol POSITIVE (*)    All other components within normal limits   CBC WITH DIFFERENTIAL/PLATELET - Abnormal; Notable for the following components:   RBC 6.63 (*)    MCV 65.8 (*)    MCH 21.9 (*)    RDW 20.5 (*)    All other components within normal limits  URINALYSIS, ROUTINE W REFLEX MICROSCOPIC - Abnormal; Notable for the following components:   Color, Urine STRAW (*)    Specific Gravity, Urine 1.002 (*)    All other components within normal limits  SARS CORONAVIRUS 2 (HOSPITAL ORDER, PERFORMED IN  HOSPITAL LAB)  TROPONIN I (HIGH SENSITIVITY)  PROTIME-INR  TROPONIN I (HIGH SENSITIVITY)  CBG MONITORING, ED  CBG MONITORING, ED    EKG EKG Interpretation  Date/Time:  Tuesday September 01 2018 16:35:15 EDT Ventricular Rate:  90 PR Interval:    QRS Duration: 88 QT Interval:  330 QTC Calculation: 404 R Axis:   57 Text Interpretation:  Sinus rhythm Minimal ST elevation, anterior leads Confirmed by Benjiman CorePickering, Karenna Romanoff [phone]([id number]) on 09/01/2018 6:06:54 PM   Radiology Ct Head Wo Contrast  Result Date: 09/01/2018 CLINICAL DATA:  Convulsions EXAM: CT HEAD WITHOUT CONTRAST TECHNIQUE: Contiguous axial images were obtained from the base of the skull through the vertex without intravenous contrast. COMPARISON:  10/22/2011 FINDINGS: Brain: No evidence of acute infarction, hemorrhage, hydrocephalus, extra-axial collection or mass lesion/mass effect. Vascular: No hyperdense vessel or unexpected calcification. Skull: Normal. Negative for fracture or focal lesion. Sinuses/Orbits: No acute finding. Other: None. IMPRESSION: No acute intracranial pathology. Electronically Signed   By: Lauralyn PrimesAlex  Bibbey M.D.   On: 09/01/2018 17:56   Ct Angio Chest Pe W And/or Wo Contrast  Result Date: 09/01/2018 CLINICAL DATA:  Chest pain for 2 days. EXAM: CT ANGIOGRAPHY CHEST WITH CONTRAST TECHNIQUE: Multidetector CT imaging of the chest was performed using the standard protocol during bolus administration of intravenous contrast. Multiplanar CT image reconstructions and MIPs were obtained  to evaluate the vascular anatomy. CONTRAST:  100mL OMNIPAQUE IOHEXOL 350 MG/ML SOLN COMPARISON:  The current chest radiograph. Prior chest CTA, 06/15/2018. FINDINGS: Cardiovascular: Satisfactory opacification of the pulmonary arteries to the segmental level. No evidence of pulmonary embolism. Normal heart size. No pericardial effusion. No coronary artery calcifications. No aortic aneurysm. No aortic atherosclerosis. Mediastinum/Nodes: No enlarged mediastinal, hilar, or axillary lymph nodes. Thyroid gland, trachea, and esophagus demonstrate no significant findings. Lungs/Pleura: Irregular nodular opacities noted both apices, with reticular/linear opacities extending to the pleura, 8 mm on the right, image 27, series 6, and 9 mm on the left, image 25, series 6. These are  most likely due to scarring. Discoid type opacity along the inferior right oblique fissure, likely atelectasis, measuring 13 x 6 mm in greatest transverse dimension. Minimal dependent atelectasis in the lower lobes. Lungs otherwise clear with no evidence of pneumonia or pulmonary edema. No pleural effusion or pneumothorax. Upper Abdomen: No acute abnormality. Musculoskeletal: No chest wall abnormality. No acute or significant osseous findings. Review of the MIP images confirms the above findings. IMPRESSION: 1. No evidence of a pulmonary embolism. 2. No acute findings. 3. Subcentimeter irregular nodular opacities in both upper lobes near the apices, most likely scarring. Non-contrast chest CT at 3-6 months is recommended. If the nodules are stable at time of repeat CT, then future CT at 18-24 months (from today's scan) is considered optional for low-risk patients, but is recommended for high-risk patients. This recommendation follows the consensus statement: Guidelines for Management of Incidental Pulmonary Nodules Detected on CT Images: From the Fleischner Society 2017; Radiology 2017; 284:228-243. Electronically Signed   By: Lajean Manes M.D.   On:  09/01/2018 19:55   Dg Chest Portable 1 View  Result Date: 09/01/2018 CLINICAL DATA:  Shortness of breath EXAM: PORTABLE CHEST 1 VIEW COMPARISON:  06/15/2018 FINDINGS: Cardiac shadows within normal limits. Lungs are well aerated bilaterally. Previously seen changes in the right lower lobe are not well appreciated on this exam. No bony abnormality is seen. IMPRESSION: No acute abnormality noted. Electronically Signed   By: Inez Catalina M.D.   On: 09/01/2018 16:44    Procedures Procedures (including critical care time)  Medications Ordered in ED Medications  nicotine (NICODERM CQ - dosed in mg/24 hours) patch 14 mg (has no administration in time range)  LORazepam (ATIVAN) injection 2 mg (2 mg Intravenous Given 09/01/18 1710)  iohexol (OMNIPAQUE) 350 MG/ML injection 100 mL (100 mLs Intravenous Contrast Given 09/01/18 1929)     Initial Impression / Assessment and Plan / ED Course  I have reviewed the triage vital signs and the nursing notes.  Pertinent labs & imaging results that were available during my care of the patient were reviewed by me and considered in my medical decision making (see chart for details).       Patient presented with chest pain then had a syncopal episode and altered mental status.  Questionable seizure activity upfront but not witnessed by me.  Reportedly had 2 beers but alcohol level is rather elevated.  With syncope I feels patient would benefit from admission n to the hospital.  CT scan of chest done to previous pulmonary embolisms and some nodules will need following seen but no pulmonary embolism.  Mental status improved compared to before but still somewhat confused.  Will discuss with hospitalist.   Final Clinical Impressions(s) / ED Diagnoses   Final diagnoses:  Syncope, unspecified syncope type  Alcoholic intoxication without complication [facility])    ED Discharge Orders    None       Davonna Belling, MD 09/01/18 2038

## 2018-09-01 NOTE — ED Notes (Signed)
Pt eating a [place] sandwich at this time.  No complaints of chest pain

## 2018-09-01 NOTE — Telephone Encounter (Signed)
Please advise to discontinue alcohol intake as interaction with Eliquis could also present with those symptoms.

## 2018-09-01 NOTE — ED Notes (Signed)
Pt pulling all leads off, getting out of bed, st's he is going outside to smoke.  Dr. Alvino Chapel made aware, st's will order Nicoderm patch.

## 2018-09-01 NOTE — ED Notes (Signed)
Attempted report x1. 

## 2018-09-01 NOTE — Telephone Encounter (Signed)
Pt called stating that he has suddenly had a lot of dental bleeding, he just wanted to let his provider know since he is currently on -apixaban (ELIQUIS) 5 MG TABS tablet. Please follow up as soon as possible

## 2018-09-01 NOTE — Telephone Encounter (Signed)
Patient was called and he states that he is not taking any aleve or ibuprofen, but patient states that he does drink alcohol.

## 2018-09-01 NOTE — ED Notes (Signed)
Pt c/o chest pain (10/10) and asks for some pain meds.

## 2018-09-01 NOTE — ED Triage Notes (Addendum)
Pt st's he has had chest pain x's 2 days.  Pt very anxious yelling he wants to leave, pt's wife at bedside  Pt's wife st's pt has had 2 40oz beers today.

## 2018-09-02 ENCOUNTER — Observation Stay (HOSPITAL_BASED_OUTPATIENT_CLINIC_OR_DEPARTMENT_OTHER): Payer: Self-pay

## 2018-09-02 ENCOUNTER — Other Ambulatory Visit: Payer: Self-pay | Admitting: Physician Assistant

## 2018-09-02 ENCOUNTER — Other Ambulatory Visit: Payer: Self-pay

## 2018-09-02 ENCOUNTER — Encounter (HOSPITAL_COMMUNITY): Payer: Self-pay | Admitting: Internal Medicine

## 2018-09-02 ENCOUNTER — Observation Stay (HOSPITAL_COMMUNITY): Payer: Self-pay

## 2018-09-02 DIAGNOSIS — I10 Essential (primary) hypertension: Secondary | ICD-10-CM

## 2018-09-02 DIAGNOSIS — F17200 Nicotine dependence, unspecified, uncomplicated: Secondary | ICD-10-CM

## 2018-09-02 DIAGNOSIS — R55 Syncope and collapse: Secondary | ICD-10-CM

## 2018-09-02 DIAGNOSIS — I2699 Other pulmonary embolism without acute cor pulmonale: Secondary | ICD-10-CM

## 2018-09-02 DIAGNOSIS — R079 Chest pain, unspecified: Secondary | ICD-10-CM

## 2018-09-02 LAB — ECHOCARDIOGRAM COMPLETE
Height: 74 in
Weight: 2331.58 oz

## 2018-09-02 LAB — TSH: TSH: 1.791 u[IU]/mL (ref 0.350–4.500)

## 2018-09-02 LAB — GLUCOSE, CAPILLARY: Glucose-Capillary: 96 mg/dL (ref 70–99)

## 2018-09-02 LAB — TROPONIN I (HIGH SENSITIVITY)
Troponin I (High Sensitivity): 3 ng/L (ref <18)
Troponin I (High Sensitivity): 3 ng/L (ref <18)
Troponin I (High Sensitivity): 4 ng/L (ref <18)

## 2018-09-02 MED ORDER — DILTIAZEM HCL ER COATED BEADS 120 MG PO CP24
120.0000 mg | ORAL_CAPSULE | Freq: Every day | ORAL | Status: DC
  Administered 2018-09-02 – 2018-09-03 (×2): 120 mg via ORAL
  Filled 2018-09-02 (×2): qty 1

## 2018-09-02 NOTE — Consult Note (Addendum)
Cardiology Consultation:   Patient ID: Travis Reed MRN: [id number]; DOB: 02-01-1966  Admit date: 09/01/2018 Date of Consult: 09/02/2018  Primary Care Provider: Hoy Reed, Travis Reed, Travis Reed Primary Cardiologist: New to Dr. Allyson Reed    Patient Profile:   Travis Reed is a 53 y.o. male with a hx of pulmonary embolism 05/2018, on Eliquis for anticoagulation, hypertension (not on any regimen), tobacco abuse and alcohol abuse who is being seen today for the evaluation of chest pain at the request of Travis Reed.  No prior cardiac history.  History of Present Illness:   Travis Reed reports chest soreness since he is PE in April 2020.  This is reproducible with palpation.  However for past 4 days he has intermittent sharp midsternal chest pain radiating left and right.  No other associated symptoms.  Patient reports intermittent palpitation but not associated with chest pain.  Due to ongoing symptoms wife brought him to ER for further evaluation.  CT angiogram was negative for pulmonary embolism.  No coronary calcification.  Chest with nodular scarlike density.  While in ER patient had weakness syncope episode with diaphoresis with subsequent another episode with extremity movements which felt like seizure-like activity.  Alcohol level was 265.  Patient reports associated palpitation or chest pain while syncope.  High-sensitivity troponin negative.  Creatinine and electrolytes normal.  Chest x-ray without acute cardiopulmonary disease.  No recurrent chest pain/syncope overnight.  Denies marijuana or cocaine abuse.  Drinks regularly.  Father with history of CAD with stenting in late 6160s.  Echo 09/02/18 IMPRESSIONS    1. The left ventricle has hyperdynamic systolic function, with an ejection fraction of >65%. The cavity size was normal. Left ventricular diastolic parameters were normal. No evidence of left ventricular regional wall motion abnormalities.  2. The right ventricle has normal systolic function.  The cavity was normal. There is no increase in right ventricular wall thickness.    Heart Pathway Score:     Past Medical History:  Diagnosis Date   Chest pain    ETOH abuse    Hypertension    Pulmonary embolism (HCC)    Pulmonary nodules     Past Surgical History:  Procedure Laterality Date   WRIST SURGERY Left      Inpatient Medications: Scheduled Meds:  apixaban  5 mg Oral BID   folic acid  1 mg Oral Daily   multivitamin with minerals  1 tablet Oral Daily   nicotine  14 mg Transdermal Once   nicotine  14 mg Transdermal Daily   sodium chloride flush  3 mL Intravenous Q12H   thiamine  100 mg Oral Daily   Or   thiamine  100 mg Intravenous Daily   Continuous Infusions:  PRN Meds: acetaminophen **OR** acetaminophen, LORazepam **OR** LORazepam, morphine injection, ondansetron **OR** ondansetron (ZOFRAN) IV  Allergies:    Allergies  Allergen Reactions   Penicillins Other (See Comments)    Unknown childhood reaction: Did it involve swelling of the face/tongue/throat, SOB, or low BP? Unk Did it involve sudden or severe rash/hives, skin peeling, or any reaction on the inside of your mouth or nose? Unk Did you need to seek medical attention at a hospital or doctor's office? Unk When did it last happen? "I was a child" If all above answers are NO, may proceed with cephalosporin use.     Social History:   Social History   Socioeconomic History   Marital status: Married    Spouse name: Not on file   Number of  children: Not on file   Years of education: Not on file   Highest education level: Not on file  Occupational History   Not on file  Social Needs   Financial resource strain: Not on file   Food insecurity    Worry: Not on file    Inability: Not on file   Transportation needs    Medical: Not on file    Non-medical: Not on file  Tobacco Use   Smoking status: Current Every Day Smoker    Packs/day: 0.50    Types: Cigarettes    Smokeless tobacco: Never Used  Substance and Sexual Activity   Alcohol use: Yes    Comment: occasinal   Drug use: Yes    Types: Marijuana   Sexual activity: Not on file  Lifestyle   Physical activity    Days per week: Not on file    Minutes per session: Not on file   Stress: Not on file  Relationships   Social connections    Talks on phone: Not on file    Gets together: Not on file    Attends religious service: Not on file    Active member of club or organization: Not on file    Attends meetings of clubs or organizations: Not on file    Relationship status: Not on file   Intimate partner violence    Fear of current or ex partner: Not on file    Emotionally abused: Not on file    Physically abused: Not on file    Forced sexual activity: Not on file  Other Topics Concern   Not on file  Social History Narrative   Not on file    Family History:   Family History  Problem Relation Age of Onset   Diabetes Mother    Hyperlipidemia Mother      ROS:  Please see the history of present illness. All other ROS reviewed and negative.     Physical Exam/Data:   Vitals:   09/02/18 0004 09/02/18 0526 09/02/18 0816 09/02/18 1131  BP:  (!) 149/88 137/86 (!) 138/105  Pulse:  72 94 91  Resp:  16 16 20   Temp:  98.3 F (36.8 C) 98 F (36.7 C) 98.2 F (36.8 C)  TempSrc:  Oral Oral Oral  SpO2:  96% 99% 99%  Weight: 66.1 kg     Height: 6\' 2"  (1.88 m)       Intake/Output Summary (Last 24 hours) at 09/02/2018 1509 Last data filed at 09/02/2018 0900 Gross per 24 hour  Intake 702 ml  Output --  Net 702 ml   Last 3 Weights 09/02/2018 09/01/2018 07/21/2018  Weight (lbs) 145 lb 11.6 oz 185 lb 149 lb 9.6 oz  Weight (kg) 66.1 kg 83.915 kg 67.858 kg     Body mass index is 18.71 kg/m.  General:  Well nourished, well developed, in no acute distress HEENT: normal Lymph: no adenopathy Neck: no JVD Endocrine:  No thryomegaly Vascular: No carotid bruits; FA pulses 2+ bilaterally  without bruits,  Cardiac:  normal S1, S2; RRR; no murmur, tender to palpation at left midsternal Lungs:  clear to auscultation bilaterally, no wheezing, rhonchi or rales  Abd: soft, nontender, no hepatomegaly  Ext: no edema Musculoskeletal:  No deformities, BUE and BLE strength normal and equal Skin: warm and dry  Neuro:  CNs 2-12 intact, no focal abnormalities noted Psych:  Normal affect   EKG:  The EKG was personally reviewed and demonstrates: Sinus rhythm at  rate of 90 bpm, repolarization abnormality Telemetry:  Telemetry was personally reviewed and demonstrates: Sinus rhythm at baseline rate of 90s with intermittent tachycardia in 130-150s, brief 6 beat episode of SVT  Relevant CV Studies:  As above  Laboratory Data:  High Sensitivity Troponin:   Recent Labs  Lab 09/01/18 1700 09/02/18 0019 09/02/18 0247 09/02/18 0419  TROPONINIHS 4 3 3 4      Cardiac EnzymesNo results for input(s): TROPONINI in the last 168 hours. No results for input(s): TROPIPOC in the last 168 hours.  Chemistry Recent Labs  Lab 09/01/18 1700  NA 138  K 3.7  CL 103  CO2 21*  GLUCOSE 65*  BUN 8  CREATININE 1.03  CALCIUM 9.5  GFRNONAA >60  GFRAA >60  ANIONGAP 14    Recent Labs  Lab 09/01/18 1700  PROT 7.7  ALBUMIN 4.1  AST 29  ALT 20  ALKPHOS 73  BILITOT 1.1   Hematology Recent Labs  Lab 09/01/18 1700  WBC 7.7  RBC 6.63*  HGB 14.5  HCT 43.6  MCV 65.8*  MCH 21.9*  MCHC 33.3  RDW 20.5*  PLT 391    Radiology/Studies:  Ct Head Wo Contrast  Result Date: 09/01/2018 CLINICAL DATA:  Convulsions EXAM: CT HEAD WITHOUT CONTRAST TECHNIQUE: Contiguous axial images were obtained from the base of the skull through the vertex without intravenous contrast. COMPARISON:  10/22/2011 FINDINGS: Brain: No evidence of acute infarction, hemorrhage, hydrocephalus, extra-axial collection or mass lesion/mass effect. Vascular: No hyperdense vessel or unexpected calcification. Skull: Normal. Negative for  fracture or focal lesion. Sinuses/Orbits: No acute finding. Other: None. IMPRESSION: No acute intracranial pathology. Electronically Signed   By: Lauralyn PrimesAlex  Bibbey M.D.   On: 09/01/2018 17:56   Ct Angio Chest Pe W And/or Wo Contrast  Result Date: 09/01/2018 CLINICAL DATA:  Chest pain for 2 days. EXAM: CT ANGIOGRAPHY CHEST WITH CONTRAST TECHNIQUE: Multidetector CT imaging of the chest was performed using the standard protocol during bolus administration of intravenous contrast. Multiplanar CT image reconstructions and MIPs were obtained to evaluate the vascular anatomy. CONTRAST:  100mL OMNIPAQUE IOHEXOL 350 MG/ML SOLN COMPARISON:  The current chest radiograph. Prior chest CTA, 06/15/2018. FINDINGS: Cardiovascular: Satisfactory opacification of the pulmonary arteries to the segmental level. No evidence of pulmonary embolism. Normal heart size. No pericardial effusion. No coronary artery calcifications. No aortic aneurysm. No aortic atherosclerosis. Mediastinum/Nodes: No enlarged mediastinal, hilar, or axillary lymph nodes. Thyroid gland, trachea, and esophagus demonstrate no significant findings. Lungs/Pleura: Irregular nodular opacities noted both apices, with reticular/linear opacities extending to the pleura, 8 mm on the right, image 27, series 6, and 9 mm on the left, image 25, series 6. These are most likely due to scarring. Discoid type opacity along the inferior right oblique fissure, likely atelectasis, measuring 13 x 6 mm in greatest transverse dimension. Minimal dependent atelectasis in the lower lobes. Lungs otherwise clear with no evidence of pneumonia or pulmonary edema. No pleural effusion or pneumothorax. Upper Abdomen: No acute abnormality. Musculoskeletal: No chest wall abnormality. No acute or significant osseous findings. Review of the MIP images confirms the above findings. IMPRESSION: 1. No evidence of a pulmonary embolism. 2. No acute findings. 3. Subcentimeter irregular nodular opacities in both  upper lobes near the apices, most likely scarring. Non-contrast chest CT at 3-6 months is recommended. If the nodules are stable at time of repeat CT, then future CT at 18-24 months (from today's scan) is considered optional for low-risk patients, but is recommended for high-risk patients. This recommendation  follows the consensus statement: Guidelines for Management of Incidental Pulmonary Nodules Detected on CT Images: From the Fleischner Society 2017; Radiology 2017; 284:228-243. Electronically Signed   By: Lajean Manes M.D.   On: 09/01/2018 19:55   Dg Chest Portable 1 View  Result Date: 09/01/2018 CLINICAL DATA:  Shortness of breath EXAM: PORTABLE CHEST 1 VIEW COMPARISON:  06/15/2018 FINDINGS: Cardiac shadows within normal limits. Lungs are well aerated bilaterally. Previously seen changes in the right lower lobe are not well appreciated on this exam. No bony abnormality is seen. IMPRESSION: No acute abnormality noted. Electronically Signed   By: Inez Catalina M.D.   On: 09/01/2018 16:44    Assessment and Plan:   1. Chest pain -Seems atypical.  He has chronic chest soreness since his PE April 2020.  Pain reproducible with palpation.  He also has intermittent sharp chest pain and palpitation which not correlated. -Troponin negative.  EKG without ischemic changes. - No coronary calcification on CTA of chest. Echo with preserved LVEF without WM abnormality.   2 Syncope - Unclear etiology. Witness seizure like activity and he is at risk given heavy alcohol use. ? Alcohol withdrawal.  -No prodromal symptoms prior to syncope. -Patient had a recurrent sinus tachycardia on telemetry overnight but no syncope episode reported. -Unclear whether patient was on a monitor during syncope episode in ER or not -No arrhythmia noted on telemetry as below  3.  Hypertension -Intermittently elevated.  Treatment per primary team  4.  History of PE -Reports compliance with Eliquis.  CTA of the chest negative for  acute PE.  5.  Alcohol/tobacco abuse -Encourage cessation.  6. Sinus tachycardia/SVT - baseline rhythm of sinus at ventricular rate of 90s, intermittent tachycardia at 130-150s. One brief episode of SVT ( 6 beats) - Normal Electrolytes  - Check TSH   For questions or updates, please contact [facility] Please consult [email or web address] for contact info under   Travis Soho, PA  09/02/2018 3:09 PM   Agree with note by Travis Lis PA-C  We are asked to evaluate Travis Reed for atypical chest pain.  He was admitted through the ER last night.  He apparently had a pulmonary embolism several weeks ago and was placed on Eliquis twice daily.  The etiology of his PE was not clear.  He does drink in excess and smokes daily.  He has no prior cardiac history.  The pain is fairly localized.  He is somewhat tender to palpation.  Chest CT showed no evidence of recurrent pulmonary emboli or coronary calcification.  Enzymes are low and his EKG shows no acute changes.  His exam otherwise is benign.  He did have witnessed syncope in the emergency room for unclear reasons.  On telemetry he has had episodes of SVT but it is unclear if these are related.  2D echo was normal.  It is unclear what the etiology of syncope was on a neurologic versus cardiogenic versus dehydration.  No further cardiac work-up necessary at this time although we will get a 30-day event monitor to further evaluate.  I have instructed the patient that he cannot drive for 6 months.  He does have essential hypertension and we will begin him on Cardizem CD 120 mg a day.  Travis Reed, M.D., [place], [facility], [place] [address] [place], [place]  [address]  [phone] 09/02/2018 3:50 PM

## 2018-09-02 NOTE — Plan of Care (Signed)

## 2018-09-02 NOTE — Progress Notes (Signed)
[facility] PROGRESS NOTE  Travis Reed ZOX:[id number] DOB: 20-Apr-1965 DOA: 09/01/2018 PCP: [name], [name], MD  Assessment/Plan:  #1. Chest pain. Atypical mostly. Heart score score 5. EKG with SR mild ST elevation, chest xray no acute abnormality, echo pending. Troponin (high sensativity) 4>>3>>3>>4. Pain intermittent worse with deep breath. Going on since 05/2018. -follow echo -repeat ekg -supportive care -cards consult. May benefit stress test either inpatient or outpatient  #2. Syncope. ?seizure. Patient no recollection. Was intoxicated at time. No further episodes. Hx stroke 2013. UDS +marijuanna  -follow echo -eeg -check orthostatics -tele  #3. Hypertension. No official dx. No home meds. BP currently high end of normal.  -monitor  -prn hydralazine  #4. ETOH. Drinks 2 quarts of beer daily. No s/sx withdrawal.  -CIWA  #5. Pulmonary nodules. Per CT -OP follow up 3-6 months  #6. Hx PE. Home meds include eliquis -continue home meds  Code Status: full Family Communication: patient  Disposition Plan: home maybe later today   Consultants:  cardiology  Procedures:  Echo  eeg  Antibiotics:    HPI/Subjective: 53 yo hx etoh abuse, PE, presents with CP and syncope with ?seizure activity. Cards consult requested  Objective: Vitals:   09/02/18 0526 09/02/18 0816  BP: (!) 149/88 137/86  Pulse: 72 94  Resp: 16 16  Temp: 98.3 F (36.8 C) 98 F (36.7 C)  SpO2: 96% 99%    Intake/Output Summary (Last 24 hours) at 09/02/2018 0901 Last data filed at 09/02/2018 0300 Gross per 24 hour  Intake 462 ml  Output -  Net 462 ml   Filed Weights   09/01/18 1715 09/02/18 0004  Weight: 83.9 kg 66.1 kg    Exam:   General:  Awake alert no acute distress  Cardiovascular: rrr no mgr no LE edema  Respiratory: normal effort BS distant but clear no wheeze  Abdomen: non-distended non-tender +BS no guarding or rebounding  Musculoskeletal: joints without  swelling/erythema   Data Reviewed: Basic Metabolic Panel: Recent Labs  Lab 09/01/18 1700  NA 138  K 3.7  CL 103  CO2 21*  GLUCOSE 65*  BUN 8  CREATININE 1.03  CALCIUM 9.5   Liver Function Tests: Recent Labs  Lab 09/01/18 1700  AST 29  ALT 20  ALKPHOS 73  BILITOT 1.1  PROT 7.7  ALBUMIN 4.1   No results for input(s): LIPASE, AMYLASE in the last 168 hours. No results for input(s): AMMONIA in the last 168 hours. CBC: Recent Labs  Lab 09/01/18 1700  WBC 7.7  NEUTROABS 4.7  HGB 14.5  HCT 43.6  MCV 65.8*  PLT 391   Cardiac Enzymes: No results for input(s): CKTOTAL, CKMB, CKMBINDEX, TROPONINI in the last 168 hours. BNP (last 3 results) No results for input(s): BNP in the last 8760 hours.  ProBNP (last 3 results) No results for input(s): PROBNP in the last 8760 hours.  CBG: Recent Labs  Lab 09/01/18 1634 09/01/18 1813 09/02/18 0631  GLUCAP 86 71 96    Recent Results (from the past 240 hour(s))  SARS Coronavirus 2 (CEPHEID - Performed in [facility] lab), Hosp Order     Status: None   Collection Time: 09/01/18  8:48 PM   Specimen: Nasopharyngeal Swab  Result Value Ref Range Status   SARS Coronavirus 2 NEGATIVE NEGATIVE Final    Comment: (NOTE) If result is NEGATIVE SARS-CoV-2 target nucleic acids are NOT DETECTED. The SARS-CoV-2 RNA is generally detectable in upper and lower  respiratory specimens during the acute phase of  infection. The lowest  concentration of SARS-CoV-2 viral copies this assay can detect is 250  copies / mL. A negative result does not preclude SARS-CoV-2 infection  and should not be used as the sole basis for treatment or other  patient management decisions.  A negative result may occur with  improper specimen collection / handling, submission of specimen other  than nasopharyngeal swab, presence of viral mutation(s) within the  areas targeted by this assay, and inadequate number of viral copies  (<250 copies / mL). A  negative result must be combined with clinical  observations, patient history, and epidemiological information. If result is POSITIVE SARS-CoV-2 target nucleic acids are DETECTED. The SARS-CoV-2 RNA is generally detectable in upper and lower  respiratory specimens dur ing the acute phase of infection.  Positive  results are indicative of active infection with SARS-CoV-2.  Clinical  correlation with patient history and other diagnostic information is  necessary to determine patient infection status.  Positive results do  not rule out bacterial infection or co-infection with other viruses. If result is PRESUMPTIVE POSTIVE SARS-CoV-2 nucleic acids MAY BE PRESENT.   A presumptive positive result was obtained on the submitted specimen  and confirmed on repeat testing.  While 2019 novel coronavirus  (SARS-CoV-2) nucleic acids may be present in the submitted sample  additional confirmatory testing may be necessary for epidemiological  and / or clinical management purposes  to differentiate between  SARS-CoV-2 and other Sarbecovirus currently known to infect humans.  If clinically indicated additional testing with an alternate test  methodology [phone] is advised. The SARS-CoV-2 RNA is generally  detectable in upper and lower respiratory sp ecimens during the acute  phase of infection. The expected result is Negative. Fact Sheet for Patients:  BoilerBrush.com.cyhttps://www.fda.gov/media/136312/download Fact Sheet for Healthcare Providers: https://pope.com/https://www.fda.gov/media/136313/download This test is not yet approved or cleared by the [place] FDA and has been authorized for detection and/or diagnosis of SARS-CoV-2 by FDA under an Emergency Use Authorization (EUA).  This EUA will remain in effect (meaning this test can be used) for the duration of the COVID-19 declaration under Section 564(b)(1) of the Act, 21 U.S.C. section 360bbb-3(b)(1), unless the authorization is terminated or revoked sooner. Performed  at [facility], [address] [place], [place] [address]      Studies: Ct Head Wo Contrast  Result Date: 09/01/2018 CLINICAL DATA:  Convulsions EXAM: CT HEAD WITHOUT CONTRAST TECHNIQUE: Contiguous axial images were obtained from the base of the skull through the vertex without intravenous contrast. COMPARISON:  10/22/2011 FINDINGS: Brain: No evidence of acute infarction, hemorrhage, hydrocephalus, extra-axial collection or mass lesion/mass effect. Vascular: No hyperdense vessel or unexpected calcification. Skull: Normal. Negative for fracture or focal lesion. Sinuses/Orbits: No acute finding. Other: None. IMPRESSION: No acute intracranial pathology. Electronically Signed   By: Lauralyn PrimesAlex  Bibbey M.D.   On: 09/01/2018 17:56   Ct Angio Chest Pe W And/or Wo Contrast  Result Date: 09/01/2018 CLINICAL DATA:  Chest pain for 2 days. EXAM: CT ANGIOGRAPHY CHEST WITH CONTRAST TECHNIQUE: Multidetector CT imaging of the chest was performed using the standard protocol during bolus administration of intravenous contrast. Multiplanar CT image reconstructions and MIPs were obtained to evaluate the vascular anatomy. CONTRAST:  100mL OMNIPAQUE IOHEXOL 350 MG/ML SOLN COMPARISON:  The current chest radiograph. Prior chest CTA, 06/15/2018. FINDINGS: Cardiovascular: Satisfactory opacification of the pulmonary arteries to the segmental level. No evidence of pulmonary embolism. Normal heart size. No pericardial effusion. No coronary artery calcifications. No aortic aneurysm. No aortic  atherosclerosis. Mediastinum/Nodes: No enlarged mediastinal, hilar, or axillary lymph nodes. Thyroid gland, trachea, and esophagus demonstrate no significant findings. Lungs/Pleura: Irregular nodular opacities noted both apices, with reticular/linear opacities extending to the pleura, 8 mm on the right, image 27, series 6, and 9 mm on the left, image 25, series 6. These are most likely due to scarring. Discoid type opacity along the inferior  right oblique fissure, likely atelectasis, measuring 13 x 6 mm in greatest transverse dimension. Minimal dependent atelectasis in the lower lobes. Lungs otherwise clear with no evidence of pneumonia or pulmonary edema. No pleural effusion or pneumothorax. Upper Abdomen: No acute abnormality. Musculoskeletal: No chest wall abnormality. No acute or significant osseous findings. Review of the MIP images confirms the above findings. IMPRESSION: 1. No evidence of a pulmonary embolism. 2. No acute findings. 3. Subcentimeter irregular nodular opacities in both upper lobes near the apices, most likely scarring. Non-contrast chest CT at 3-6 months is recommended. If the nodules are stable at time of repeat CT, then future CT at 18-24 months (from today's scan) is considered optional for low-risk patients, but is recommended for high-risk patients. This recommendation follows the consensus statement: Guidelines for Management of Incidental Pulmonary Nodules Detected on CT Images: From the Fleischner Society 2017; Radiology 2017; 284:228-243. Electronically Signed   By: Lajean Manes M.D.   On: 09/01/2018 19:55   Dg Chest Portable 1 View  Result Date: 09/01/2018 CLINICAL DATA:  Shortness of breath EXAM: PORTABLE CHEST 1 VIEW COMPARISON:  06/15/2018 FINDINGS: Cardiac shadows within normal limits. Lungs are well aerated bilaterally. Previously seen changes in the right lower lobe are not well appreciated on this exam. No bony abnormality is seen. IMPRESSION: No acute abnormality noted. Electronically Signed   By: Inez Catalina M.D.   On: 09/01/2018 16:44    Scheduled Meds: . apixaban  5 mg Oral BID  . folic acid  1 mg Oral Daily  . multivitamin with minerals  1 tablet Oral Daily  . nicotine  14 mg Transdermal Once  . nicotine  14 mg Transdermal Daily  . sodium chloride flush  3 mL Intravenous Q12H  . thiamine  100 mg Oral Daily   Or  . thiamine  100 mg Intravenous Daily   Continuous Infusions:  Principal  Problem:   Chest pain Active Problems:   Pulmonary embolism (HCC)   Syncope   Smoker   Alcohol intoxication ([place])   Pulmonary nodules   Essential hypertension    Time spent: 34 minutex    [facility] M NP  [facility]  If 7PM-7AM, please contact night-coverage at [email or web address], password [facility] 09/02/2018, 9:01 AM  LOS: 0 days

## 2018-09-02 NOTE — Progress Notes (Signed)
  Echocardiogram 2D Echocardiogram has been performed.  Brigitta Pricer G Saben Donigan 09/02/2018, 11:09 AM

## 2018-09-02 NOTE — Progress Notes (Signed)
EEG complete - results pending 

## 2018-09-02 NOTE — Procedures (Signed)
History: 53 year old male being evaluated for syncope.  Sedation: None  Technique: This is a 21 channel routine scalp EEG performed at the bedside with bipolar and monopolar montages arranged in accordance to the international 10/20 system of electrode placement. One channel was dedicated to EKG recording.    Background: The background consists of intermixed alpha and beta activities. There is a well defined posterior dominant rhythm of 9 hz that attenuates with eye opening.  With drowsiness there is anterior shifting of the posterior dominant rhythm, but sleep is not recorded. Photic stimulation: Physiologic driving is present  EEG Abnormalities: None  Clinical Interpretation: This normal EEG is recorded in the waking and drowsy state. There was no seizure or seizure predisposition recorded on this study. Please note that lack of epileptiform activity on EEG does not preclude the possibility of epilepsy.   Roland Rack, MD [facility] [phone]  If 7pm- 7am, please page neurology on call as listed in [place].

## 2018-09-03 ENCOUNTER — Other Ambulatory Visit: Payer: Self-pay | Admitting: *Deleted

## 2018-09-03 DIAGNOSIS — R55 Syncope and collapse: Secondary | ICD-10-CM

## 2018-09-03 LAB — GLUCOSE, CAPILLARY: Glucose-Capillary: 111 mg/dL — ABNORMAL HIGH (ref 70–99)

## 2018-09-03 MED ORDER — TRAMADOL HCL 50 MG PO TABS: 50.0000 mg | ORAL_TABLET | Freq: Four times a day (QID) | ORAL | Status: DC | PRN

## 2018-09-03 MED ORDER — TRAMADOL HCL 50 MG PO TABS: 50.0000 mg | ORAL_TABLET | Freq: Four times a day (QID) | ORAL | 0 refills | Status: DC | PRN

## 2018-09-03 MED ORDER — DILTIAZEM HCL ER COATED BEADS 120 MG PO CP24: 120.0000 mg | ORAL_CAPSULE | Freq: Every day | ORAL | 1 refills | Status: DC

## 2018-09-03 MED FILL — DILTIAZEM 24HR ER 120 MG CA: 120 | 30 days supply | Qty: 30 | Fill #0

## 2018-09-03 MED FILL — traMADol HCL 50 MG TABS: 50 | 7 days supply | Qty: 30 | Fill #0

## 2018-09-03 NOTE — Discharge Instructions (Signed)

## 2018-09-03 NOTE — Progress Notes (Addendum)
Progress Note  Patient Name: Travis ForemanJohn L Reed Date of Encounter: 09/03/2018  Primary Cardiologist: New to Dr. Allyson SabalBerry   Subjective   No recurrent tachycardia or syncope episode.  Feeling better today.  He had heavy lifting about a week ago and since then had chest pain.  Inpatient Medications    Scheduled Meds: . apixaban  5 mg Oral BID  . diltiazem  120 mg Oral Daily  . folic acid  1 mg Oral Daily  . multivitamin with minerals  1 tablet Oral Daily  . nicotine  14 mg Transdermal Daily  . sodium chloride flush  3 mL Intravenous Q12H  . thiamine  100 mg Oral Daily   Or  . thiamine  100 mg Intravenous Daily   Continuous Infusions:  PRN Meds: acetaminophen **OR** acetaminophen, LORazepam **OR** LORazepam, morphine injection, ondansetron **OR** ondansetron (ZOFRAN) IV   Vital Signs    Vitals:   09/03/18 0057 09/03/18 0523 09/03/18 0524 09/03/18 0804  BP: (!) 128/99 (!) 129/107  (!) 139/95  Pulse: 78 76  75  Resp: 14 16  20   Temp: 97.8 F (36.6 C) 97.9 F (36.6 C)  98 F (36.7 C)  TempSrc: Oral Oral  Oral  SpO2: 100% 100%  100%  Weight:   68.4 kg   Height:        Intake/Output Summary (Last 24 hours) at 09/03/2018 0857 Last data filed at 09/03/2018 0805 Gross per 24 hour  Intake 1197 ml  Output 900 ml  Net 297 ml   Last 3 Weights 09/03/2018 09/02/2018 09/01/2018  Weight (lbs) 150 lb 12.8 oz 145 lb 11.6 oz 185 lb  Weight (kg) 68.402 kg 66.1 kg 83.915 kg      Telemetry    Sinus rhythm at rate of 80s- Personally Reviewed  ECG    No new tracing  Physical Exam  GEN: No acute distress.   Neck: No JVD Cardiac: RRR, no murmurs, rubs, or gallops.  Respiratory: Clear to auscultation bilaterally. GI: Soft, nontender, non-distended  MS: No edema; No deformity. Neuro:  Nonfocal  Psych: Normal affect   Labs    High Sensitivity Troponin:   Recent Labs  Lab 09/01/18 1700 09/02/18 0019 09/02/18 0247 09/02/18 0419  TROPONINIHS 4 3 3 4       Cardiac EnzymesNo  results for input(s): TROPONINI in the last 168 hours. No results for input(s): TROPIPOC in the last 168 hours.   Chemistry Recent Labs  Lab 09/01/18 1700  NA 138  K 3.7  CL 103  CO2 21*  GLUCOSE 65*  BUN 8  CREATININE 1.03  CALCIUM 9.5  PROT 7.7  ALBUMIN 4.1  AST 29  ALT 20  ALKPHOS 73  BILITOT 1.1  GFRNONAA >60  GFRAA >60  ANIONGAP 14     Hematology Recent Labs  Lab 09/01/18 1700  WBC 7.7  RBC 6.63*  HGB 14.5  HCT 43.6  MCV 65.8*  MCH 21.9*  MCHC 33.3  RDW 20.5*  PLT 391    BNPNo results for input(s): BNP, PROBNP in the last 168 hours.   DDimer No results for input(s): DDIMER in the last 168 hours.   Radiology    Ct Head Wo Contrast  Result Date: 09/01/2018 CLINICAL DATA:  Convulsions EXAM: CT HEAD WITHOUT CONTRAST TECHNIQUE: Contiguous axial images were obtained from the base of the skull through the vertex without intravenous contrast. COMPARISON:  10/22/2011 FINDINGS: Brain: No evidence of acute infarction, hemorrhage, hydrocephalus, extra-axial collection or mass lesion/mass effect. Vascular:  No hyperdense vessel or unexpected calcification. Skull: Normal. Negative for fracture or focal lesion. Sinuses/Orbits: No acute finding. Other: None. IMPRESSION: No acute intracranial pathology. Electronically Signed   By: Eddie Candle M.D.   On: 09/01/2018 17:56   Ct Angio Chest Pe W And/or Wo Contrast  Result Date: 09/01/2018 CLINICAL DATA:  Chest pain for 2 days. EXAM: CT ANGIOGRAPHY CHEST WITH CONTRAST TECHNIQUE: Multidetector CT imaging of the chest was performed using the standard protocol during bolus administration of intravenous contrast. Multiplanar CT image reconstructions and MIPs were obtained to evaluate the vascular anatomy. CONTRAST:  157mL OMNIPAQUE IOHEXOL 350 MG/ML SOLN COMPARISON:  The current chest radiograph. Prior chest CTA, 06/15/2018. FINDINGS: Cardiovascular: Satisfactory opacification of the pulmonary arteries to the segmental level. No evidence  of pulmonary embolism. Normal heart size. No pericardial effusion. No coronary artery calcifications. No aortic aneurysm. No aortic atherosclerosis. Mediastinum/Nodes: No enlarged mediastinal, hilar, or axillary lymph nodes. Thyroid gland, trachea, and esophagus demonstrate no significant findings. Lungs/Pleura: Irregular nodular opacities noted both apices, with reticular/linear opacities extending to the pleura, 8 mm on the right, image 27, series 6, and 9 mm on the left, image 25, series 6. These are most likely due to scarring. Discoid type opacity along the inferior right oblique fissure, likely atelectasis, measuring 13 x 6 mm in greatest transverse dimension. Minimal dependent atelectasis in the lower lobes. Lungs otherwise clear with no evidence of pneumonia or pulmonary edema. No pleural effusion or pneumothorax. Upper Abdomen: No acute abnormality. Musculoskeletal: No chest wall abnormality. No acute or significant osseous findings. Review of the MIP images confirms the above findings. IMPRESSION: 1. No evidence of a pulmonary embolism. 2. No acute findings. 3. Subcentimeter irregular nodular opacities in both upper lobes near the apices, most likely scarring. Non-contrast chest CT at 3-6 months is recommended. If the nodules are stable at time of repeat CT, then future CT at 18-24 months (from today's scan) is considered optional for low-risk patients, but is recommended for high-risk patients. This recommendation follows the consensus statement: Guidelines for Management of Incidental Pulmonary Nodules Detected on CT Images: From the Fleischner Society 2017; Radiology 2017; 284:228-243. Electronically Signed   By: Lajean Manes M.D.   On: 09/01/2018 19:55   Dg Chest Portable 1 View  Result Date: 09/01/2018 CLINICAL DATA:  Shortness of breath EXAM: PORTABLE CHEST 1 VIEW COMPARISON:  06/15/2018 FINDINGS: Cardiac shadows within normal limits. Lungs are well aerated bilaterally. Previously seen changes in  the right lower lobe are not well appreciated on this exam. No bony abnormality is seen. IMPRESSION: No acute abnormality noted. Electronically Signed   By: Inez Catalina M.D.   On: 09/01/2018 16:44    Cardiac Studies   Echo 09/02/18 IMPRESSIONS   1. The left ventricle has hyperdynamic systolic function, with an ejection fraction of >65%. The cavity size was normal. Left ventricular diastolic parameters were normal. No evidence of left ventricular regional wall motion abnormalities. 2. The right ventricle has normal systolic function. The cavity was normal. There is no increase in right ventricular wall thickness.   Patient Profile     Travis Reed is a 53 y.o. male with a hx of pulmonary embolism 05/2018, on Eliquis for anticoagulation, hypertension (not on any regimen), tobacco abuse and alcohol abuse who is seen for the evaluation of chest pain.   Assessment & Plan    1.  Chest pain -Atypical.  Reproducible with palpation.  Symptoms started after heavy exertional activity about a week ago.  Questionable costochondritis.  Consistent with musculoskeletal in etiology. No coronary calcification on CTA of chest. Echo with preserved LVEF without WM abnormality.  No further cardiac work-up.  2.  Syncope -Unclear etiology.  Will arrange outpatient event monitor.  No driving for 6 months.  3.  Sinus tachycardia -Heart rate now improved to 80s on diltiazem long-acting 120 mg.  4.  Hypertension -Improved with addition of Cardizem  5.  Alcohol/tobacco abuse -By cessation.  Education given.  6.  History of PE -On Eliquis [facility] will sign off.   Medication Recommendations: As summarized above Other recommendations (labs, testing, etc): Event monitor Follow up as an outpatient: Will arrange follow-up after monitor  For questions or updates, please contact [facility] Please consult [email or web address] for contact info under        Signed, Manson PasseyBhavinkumar Bhagat, PA  09/03/2018,  8:57 AM    Agree with note by Chelsea AusVin Bhagat PA-C  Symptoms most compatible with costochondritis.  EF normal without segmental wall motion abnormalities.  Enzymes negative.  CT of chest negative for recurrent PE.  On Eliquis oral anticoagulation.  Etiology of syncope in ER still unclear.  He did have some runs of SVT.  We will obtain a 30-day event monitor upon discharge.  I discussed the importance of not driving for 6 months as well as lifestyle modification.  If his event monitor is unremarkable he will not need to be seen back in follow-up.  We will sign off but be available if further questions arise.  Runell GessJonathan J. Surafel Hilleary, M.D., FACP, [facility] HospitalFACC, Earl LagosFAHA, [facility] InstituteFSCAI [facility] [address] [place], [place]  [id number]  [phone] 09/03/2018 10:36 AM

## 2018-09-03 NOTE — Clinical Social Work Note (Signed)
Travis Reed cash used to pay $18 for medications. TOC pharmacists will bring medications to room. RN and patient aware. Patient will discharge to his mother's house today.  CSW signing off.  Travis Reed, [place]

## 2018-09-03 NOTE — Discharge Summary (Addendum)
Physician Discharge Summary  Travis BeersJohn L Reed ONG:[id number]:[id number] DOB: 1965-12-19 DOA: 09/01/2018  PCP: [name], [name], MD  Admit date: 09/01/2018 Discharge date: 09/03/2018  Time spent: 45 minutes  Recommendations for Outpatient Follow-up:  1. Follow up with PCP 2-3 weeks for evaluation of BP control and HR control 2. holter monitor for 30 days 3. No driving 6 months   Discharge Diagnoses:  Principal Problem:   Chest pain Active Problems:   Pulmonary embolism (HCC)   Syncope   Smoker   Alcohol intoxication (HCC)   Pulmonary nodules   Essential hypertension   Discharge Condition: stable   Diet recommendation: heart healthy  Filed Weights   09/01/18 1715 09/02/18 0004 09/03/18 0524  Weight: 83.9 kg 66.1 kg 68.4 kg    History of present illness:  Travis ForemanJohn L Reed is a 53 y.o. male with medical history significant of PE back in April, ongoing heavy EtOH use / abuse presented 7/7 with cc chest pain and syncope in triage area.   Hospital Course:  #1. Chest pain. Atypical mostly. Heart score score 5. EKG with SR mild ST elevation, chest xray no acute abnormality, echo with preserved LVEF without wall motion abnormality. Troponin (high sensativity) 4>>3>>3>>4. Evaluated by cardiology who opine likely costochondritis and recommend 30 day holter monitor. Of note, day of discharge patient admitted to heavy lifting days before pain started.  Tramadol at discharge.  #2. Syncope. ?seizure. EEG negative. At risk for seizure due to ETOH. No further episodes.  UDS +marijuanna. Has run of SVT while in hospital. Diltiazem started.  30 day holter monitor as noted above.   #3. Hypertension. No official dx. No home meds. BP high end of normal. dilt started. Improved control at discharge.  #4. ETOH. Drinks 2 quarts of beer daily. No s/sx withdrawal.    #5. Pulmonary nodules. Per CT -OP follow up 3-6 months  #6. Hx PE. Home meds include eliquis Procedures:    Consultations:  Dr Allyson SabalBerry  cardioloyg  Discharge Exam: Vitals:   09/03/18 0523 09/03/18 0804  BP: (!) 129/107 (!) 139/95  Pulse: 76 75  Resp: 16 20  Temp: 97.9 F (36.6 C) 98 F (36.7 C)  SpO2: 100% 100%    General: awake alert eating breakfast no acute distress Cardiovascular: RRR no mgr no LE edema Respiratory: normal effort BS clear bilaterally  Discharge Instructions   Discharge Instructions    Call MD for:  persistant dizziness or light-headedness   Complete by: As directed    Call MD for:  severe uncontrolled pain   Complete by: As directed    Diet - low sodium heart healthy   Complete by: As directed    Discharge instructions   Complete by: As directed    Take medication as prescribed No driving 6 months Cardiology will contact you to set up 30 day holter monitor   Increase activity slowly   Complete by: As directed      Allergies as of 09/03/2018      Reactions   Penicillins Other (See Comments)   Unknown childhood reaction: Did it involve swelling of the face/tongue/throat, SOB, or low BP? Unk Did it involve sudden or severe rash/hives, skin peeling, or any reaction on the inside of your mouth or nose? Unk Did you need to seek medical attention at a hospital or doctor's office? Unk When did it last happen? "I was a child" If all above answers are "NO", may proceed with cephalosporin use.      Medication List  TAKE these medications   apixaban 5 MG Tabs tablet Commonly known as: ELIQUIS Take 1 tablet (5 mg total) by mouth 2 (two) times daily.   diltiazem 120 MG 24 hr capsule Commonly known as: CARDIZEM CD Take 1 capsule (120 mg total) by mouth daily. Start taking on: September 04, 2018   naproxen sodium 220 MG tablet Commonly known as: ALEVE Take 220-440 mg by mouth 2 (two) times daily as needed (for pain).   nicotine 7 mg/24hr patch Commonly known as: Nicoderm CQ Place 1 patch (7 mg total) onto the skin daily.   ONE-A-DAY MENS VITACRAVES PO Take 1 tablet by mouth  daily.   traMADol 50 MG tablet Commonly known as: ULTRAM Take 1 tablet (50 mg total) by mouth every 6 (six) hours as needed for moderate pain.      Allergies  Allergen Reactions  . Penicillins Other (See Comments)    Unknown childhood reaction: Did it involve swelling of the face/tongue/throat, SOB, or low BP? Unk Did it involve sudden or severe rash/hives, skin peeling, or any reaction on the inside of your mouth or nose? Unk Did you need to seek medical attention at a hospital or doctor's office? Unk When did it last happen? "I was a child" If all above answers are "NO", may proceed with cephalosporin use.    Follow-up Information    Lorretta Harp, MD Follow up.   Specialties: Cardiology, Radiology Why: Office will call with event monitor set up and follow up afterwards with Dr. Pearla Dubonnet information: [address] [place] [address] [phone]            The results of significant diagnostics from this hospitalization (including imaging, microbiology, ancillary and laboratory) are listed below for reference.    Significant Diagnostic Studies: Ct Head Wo Contrast  Result Date: 09/01/2018 CLINICAL DATA:  Convulsions EXAM: CT HEAD WITHOUT CONTRAST TECHNIQUE: Contiguous axial images were obtained from the base of the skull through the vertex without intravenous contrast. COMPARISON:  10/22/2011 FINDINGS: Brain: No evidence of acute infarction, hemorrhage, hydrocephalus, extra-axial collection or mass lesion/mass effect. Vascular: No hyperdense vessel or unexpected calcification. Skull: Normal. Negative for fracture or focal lesion. Sinuses/Orbits: No acute finding. Other: None. IMPRESSION: No acute intracranial pathology. Electronically Signed   By: Eddie Candle M.D.   On: 09/01/2018 17:56   Ct Angio Chest Pe W And/or Wo Contrast  Result Date: 09/01/2018 CLINICAL DATA:  Chest pain for 2 days. EXAM: CT ANGIOGRAPHY CHEST WITH CONTRAST TECHNIQUE:  Multidetector CT imaging of the chest was performed using the standard protocol during bolus administration of intravenous contrast. Multiplanar CT image reconstructions and MIPs were obtained to evaluate the vascular anatomy. CONTRAST:  140mL OMNIPAQUE IOHEXOL 350 MG/ML SOLN COMPARISON:  The current chest radiograph. Prior chest CTA, 06/15/2018. FINDINGS: Cardiovascular: Satisfactory opacification of the pulmonary arteries to the segmental level. No evidence of pulmonary embolism. Normal heart size. No pericardial effusion. No coronary artery calcifications. No aortic aneurysm. No aortic atherosclerosis. Mediastinum/Nodes: No enlarged mediastinal, hilar, or axillary lymph nodes. Thyroid gland, trachea, and esophagus demonstrate no significant findings. Lungs/Pleura: Irregular nodular opacities noted both apices, with reticular/linear opacities extending to the pleura, 8 mm on the right, image 27, series 6, and 9 mm on the left, image 25, series 6. These are most likely due to scarring. Discoid type opacity along the inferior right oblique fissure, likely atelectasis, measuring 13 x 6 mm in greatest transverse dimension. Minimal dependent atelectasis in the lower lobes.  Lungs otherwise clear with no evidence of pneumonia or pulmonary edema. No pleural effusion or pneumothorax. Upper Abdomen: No acute abnormality. Musculoskeletal: No chest wall abnormality. No acute or significant osseous findings. Review of the MIP images confirms the above findings. IMPRESSION: 1. No evidence of a pulmonary embolism. 2. No acute findings. 3. Subcentimeter irregular nodular opacities in both upper lobes near the apices, most likely scarring. Non-contrast chest CT at 3-6 months is recommended. If the nodules are stable at time of repeat CT, then future CT at 18-24 months (from today's scan) is considered optional for low-risk patients, but is recommended for high-risk patients. This recommendation follows the consensus statement:  Guidelines for Management of Incidental Pulmonary Nodules Detected on CT Images: From the Fleischner Society 2017; Radiology 2017; 284:228-243. Electronically Signed   By: Amie Portlandavid  Ormond M.D.   On: 09/01/2018 19:55   Dg Chest Portable 1 View  Result Date: 09/01/2018 CLINICAL DATA:  Shortness of breath EXAM: PORTABLE CHEST 1 VIEW COMPARISON:  06/15/2018 FINDINGS: Cardiac shadows within normal limits. Lungs are well aerated bilaterally. Previously seen changes in the right lower lobe are not well appreciated on this exam. No bony abnormality is seen. IMPRESSION: No acute abnormality noted. Electronically Signed   By: Alcide CleverMark  Lukens M.D.   On: 09/01/2018 16:44    Microbiology: Recent Results (from the past 240 hour(s))  SARS Coronavirus 2 (CEPHEID - [facility]formed [facility]), Hosp Order     Status: None   Collection Time: 09/01/18  8:48 PM   Specimen: Nasopharyngeal Swab  Result Value Ref Range Status   SARS Coronavirus 2 NEGATIVE NEGATIVE Final    Comment: (NOTE) If result is NEGATIVE SARS-CoV-2 target nucleic acids are NOT DETECTED. The SARS-CoV-2 RNA is generally detectable in upper and lower  respiratory specimens during the acute phase of infection. The lowest  concentration of SARS-CoV-2 viral copies this assay can detect is 250  copies / mL. A negative result does not preclude SARS-CoV-2 infection  and should not be used as the sole basis for treatment or other  patient management decisions.  A negative result may occur with  improper specimen collection / handling, submission of specimen other  than nasopharyngeal swab, presence of viral mutation(s) within the  areas targeted by this assay, and inadequate number of viral copies  (<250 copies / mL). A negative result must be combined with clinical  observations, patient history, and epidemiological information. If result is POSITIVE SARS-CoV-2 target nucleic acids are DETECTED. The SARS-CoV-2 RNA is generally detectable in  upper and lower  respiratory specimens dur ing the acute phase of infection.  Positive  results are indicative of active infection with SARS-CoV-2.  Clinical  correlation with patient history and other diagnostic information is  necessary to determine patient infection status.  Positive results do  not rule out bacterial infection or co-infection with other viruses. If result is PRESUMPTIVE POSTIVE SARS-CoV-2 nucleic acids MAY BE PRESENT.   A presumptive positive result was obtained on the submitted specimen  and confirmed on repeat testing.  While 2019 novel coronavirus  (SARS-CoV-2) nucleic acids may be present in the submitted sample  additional confirmatory testing may be necessary for epidemiological  and / or clinical management purposes  to differentiate between  SARS-CoV-2 and other Sarbecovirus currently known to infect humans.  If clinically indicated additional testing with an alternate test  methodology [phone]([id number]) is advised. The SARS-CoV-2 RNA is generally  detectable in upper and lower respiratory sp ecimens during the  acute  phase of infection. The expected result is Negative. Fact Sheet for Patients:  BoilerBrush.com.cyhttps://www.fda.gov/media/136312/download Fact Sheet for Healthcare Providers: https://pope.com/https://www.fda.gov/media/136313/download This test is not yet approved or cleared by the [place] FDA and has been authorized for detection and/or diagnosis of SARS-CoV-2 by FDA under an Emergency Use Authorization (EUA).  This EUA will remain in effect (meaning this test can be used) for the duration of the COVID-19 declaration under Section 564(b)(1) of the Act, 21 U.S.C. section 360bbb-3(b)(1), unless the authorization is terminated or revoked sooner. Performed at [facility], [address] [place], [place] [address]      Labs: Basic Metabolic Panel: Recent Labs  Lab 09/01/18 1700  NA 138  K 3.7  CL 103  CO2 21*  GLUCOSE 65*  BUN 8  CREATININE 1.03  CALCIUM 9.5    Liver Function Tests: Recent Labs  Lab 09/01/18 1700  AST 29  ALT 20  ALKPHOS 73  BILITOT 1.1  PROT 7.7  ALBUMIN 4.1   No results for input(s): LIPASE, AMYLASE in the last 168 hours. No results for input(s): AMMONIA in the last 168 hours. CBC: Recent Labs  Lab 09/01/18 1700  WBC 7.7  NEUTROABS 4.7  HGB 14.5  HCT 43.6  MCV 65.8*  PLT 391   Cardiac Enzymes: No results for input(s): CKTOTAL, CKMB, CKMBINDEX, TROPONINI in the last 168 hours. BNP: BNP (last 3 results) No results for input(s): BNP in the last 8760 hours.  ProBNP (last 3 results) No results for input(s): PROBNP in the last 8760 hours.  CBG: Recent Labs  Lab 09/01/18 1634 09/01/18 1813 09/02/18 0631 09/03/18 0521  GLUCAP 86 71 96 111*       Signed:  Toya SmothersBLACK, M NP.  [facility] 09/03/2018, 10:43 AM

## 2018-09-04 ENCOUNTER — Telehealth: Payer: Self-pay | Admitting: Radiology

## 2018-09-04 NOTE — Telephone Encounter (Signed)
Enrolled patient for a 30 day [employer] Event monitor to be mailed. Brief instructions were gone over with the patient and he knows to expect the monitor to arrive in 3-4 days.  *patient is currently selfpay he was instructed to talk to [employer] about there selfpay options.

## 2018-09-10 ENCOUNTER — Encounter (INDEPENDENT_AMBULATORY_CARE_PROVIDER_SITE_OTHER): Payer: Self-pay

## 2018-09-10 DIAGNOSIS — R55 Syncope and collapse: Secondary | ICD-10-CM

## 2018-09-18 ENCOUNTER — Encounter: Payer: Self-pay | Admitting: Family Medicine

## 2018-09-18 ENCOUNTER — Ambulatory Visit: Payer: Self-pay | Attending: Family Medicine | Admitting: Family Medicine

## 2018-09-18 ENCOUNTER — Other Ambulatory Visit: Payer: Self-pay

## 2018-09-18 VITALS — BP 139/88 | HR 68 | Temp 98.3°F | Wt 158.2 lb

## 2018-09-18 DIAGNOSIS — Z09 Encounter for follow-up examination after completed treatment for conditions other than malignant neoplasm: Secondary | ICD-10-CM

## 2018-09-18 DIAGNOSIS — Z86711 Personal history of pulmonary embolism: Secondary | ICD-10-CM

## 2018-09-18 DIAGNOSIS — Z720 Tobacco use: Secondary | ICD-10-CM

## 2018-09-18 DIAGNOSIS — F1090 Alcohol use, unspecified, uncomplicated: Secondary | ICD-10-CM

## 2018-09-18 DIAGNOSIS — R0789 Other chest pain: Secondary | ICD-10-CM

## 2018-09-18 DIAGNOSIS — Z7901 Long term (current) use of anticoagulants: Secondary | ICD-10-CM

## 2018-09-18 DIAGNOSIS — M940 Chondrocostal junction syndrome [Tietze]: Secondary | ICD-10-CM

## 2018-09-18 DIAGNOSIS — R9389 Abnormal findings on diagnostic imaging of other specified body structures: Secondary | ICD-10-CM

## 2018-09-18 DIAGNOSIS — Z7289 Other problems related to lifestyle: Secondary | ICD-10-CM

## 2018-09-18 DIAGNOSIS — I10 Essential (primary) hypertension: Secondary | ICD-10-CM

## 2018-09-18 DIAGNOSIS — Z789 Other specified health status: Secondary | ICD-10-CM

## 2018-09-18 DIAGNOSIS — R55 Syncope and collapse: Secondary | ICD-10-CM

## 2018-09-18 MED ORDER — DILTIAZEM HCL ER COATED BEADS 120 MG PO CP24: 120.0000 mg | ORAL_CAPSULE | Freq: Every day | ORAL | 5 refills | Status: DC

## 2018-09-18 MED ORDER — FAMOTIDINE 20 MG PO TABS: 20.0000 mg | ORAL_TABLET | Freq: Two times a day (BID) | ORAL | 6 refills | Status: DC

## 2018-09-18 MED ORDER — TRAMADOL HCL 50 MG PO TABS: 50.0000 mg | ORAL_TABLET | Freq: Two times a day (BID) | ORAL | 0 refills | Status: DC | PRN

## 2018-09-18 MED FILL — traMADol HCL 50 MG TABS: 50 | 15 days supply | Qty: 30 | Fill #0

## 2018-09-18 NOTE — Progress Notes (Signed)
Patient ID: Travis ForemanJohn L Reed, male    DOB: 10/13/1965  MRN: [id number]   SUBJECTIVE:  Travis Reed is a 53 y.o. male who presents for hospital f/u. [facility]  When:09/01/2018-09/03/2018 Primary Dx: Chest pain-atypical; Syncope; pulmonary embolism, smoker, alcohol intoxication, pulmonary nodules, essential hypertension  HPI: 53 year old male status post hospitalization from 09/01/2018 through 09/03/2018 due to onset of chest pain.  Patient states that he still has some chest pain which is on either side of the center of the chest.  Patient was provided with a prescription for tramadol at hospital discharge and patient would like to have another prescription for this medication as he continues to have occasional pain that is more severe and about a 6 or greater on a 0-to-10 scale especially with certain movements.  He reports that he was told that his chest pain was due to costochondritis but he does not really know what this means.  Patient states that he has had a prior pulmonary embolism but his current pain does not feel the same as when he had the blood clot in his lungs.  He reports that he continues to take his blood thinning medication and has had no unusual bruising or bleeding- no nosebleeds, no blood in the stool, no blood in the urine and no bleeding from his gums when brushing his teeth.  He also denies any dark or black stools.  He denies any abdominal pain.       He reports that he is wearing a heart monitor at today's visit.  He reports that he was told that he passed out twice while in the emergency department but he does not really recall what happened.  He does have an upcoming appointment with cardiology.  Patient also states that he was told that his chest CT was abnormal.  Patient reports that he does continue to smoke.  He denies any current issues with cough or shortness of breath.  Patient also continues to drink alcohol but he states that he is trying to decrease his  alcohol consumption.  He now reports that he only has a few beers each night (per hospital notes, patient reported drinking 2 quarts of beer per night).  He denies any issues with urinary frequency, urgency or dysuria.  No abdominal pain no nausea.  He was also prescribed nicotine patch and he reports that he is trying to decrease and eventually quit smoking.       He reports any additional syncopal episodes or sensation of feeling as if he will faint or fall since his hospitalization.  He is taking the new blood pressure medicine that was prescribed and he denies any headaches or dizziness related to his blood pressure.  He has had no issues with swelling in his legs.  He has had no chest pain that is solely located on the left side of his chest and no radiation of chest pain to the neck, throat or jaw.  Patient does have some occasional bilateral upper shoulder pain.   Patient Active Problem List   Diagnosis Date Noted  . Essential hypertension 09/02/2018  . Syncope 09/01/2018  . Alcohol intoxication (HCC) 09/01/2018  . Pulmonary nodules 09/01/2018  . Chest pain 09/01/2018  . Smoker 06/25/2018  . Pulmonary embolism (HCC) 06/15/2018     Current Outpatient Medications on File Prior to Visit  Medication Sig Dispense Refill  . apixaban (ELIQUIS) 5 MG TABS tablet Take 1 tablet (5 mg total) by mouth 2 (two) times daily.  60 tablet 5  . Multiple Vitamins-Minerals (ONE-A-DAY MENS VITACRAVES PO) Take 1 tablet by mouth daily.    . naproxen sodium (ALEVE) 220 MG tablet Take 220-440 mg by mouth 2 (two) times daily as needed (for pain).    . nicotine (NICODERM CQ - DOSED IN MG/24 HR) 7 mg/24hr patch Place 1 patch (7 mg total) onto the skin daily. 28 patch 1   No current facility-administered medications on file prior to visit.     Allergies  Allergen Reactions  . Penicillins Other (See Comments)    Unknown childhood reaction: Did it involve swelling of the face/tongue/throat, SOB, or low BP? Unk  Did it involve sudden or severe rash/hives, skin peeling, or any reaction on the inside of your mouth or nose? Unk Did you need to seek medical attention at a hospital or doctor's office? Unk When did it last happen? "I was a child" If all above answers are "NO", may proceed with cephalosporin use.     Social History   Tobacco Use  . Smoking status: Current Every Day Smoker    Packs/day: 0.50    Types: Cigarettes  . Smokeless tobacco: Never Used  Substance Use Topics  . Alcohol use: Yes    Comment: occasinal  . Drug use: Yes    Types: Marijuana     Family History  Problem Relation Age of Onset  . Diabetes Mother   . Hyperlipidemia Mother   . CAD Father     Past Surgical History:  Procedure Laterality Date  . WRIST SURGERY Left     ROS: Review of Systems  Constitutional: Positive for fatigue (mild). Negative for chills and fever.  HENT: Negative for nosebleeds, sore throat and trouble swallowing.   Eyes: Negative for photophobia and visual disturbance.  Respiratory: Negative for cough, chest tightness and shortness of breath.   Cardiovascular: Positive for chest pain. Negative for palpitations and leg swelling.  Gastrointestinal: Negative for abdominal pain, anal bleeding, blood in stool, constipation, diarrhea and nausea.  Endocrine: Negative for cold intolerance, heat intolerance, polydipsia, polyphagia and polyuria.  Genitourinary: Negative for dysuria and frequency.  Musculoskeletal: Positive for arthralgias. Negative for gait problem.  Skin: Negative for rash and wound.  Neurological: Negative for dizziness, weakness, light-headedness and headaches.  Hematological: Negative for adenopathy. Does not bruise/bleed easily.     PHYSICAL EXAM: BP 139/88   Pulse 68   Temp 98.3 F (36.8 C) (Oral)   Wt 158 lb 3.2 oz (71.8 kg)   SpO2 99%   BMI 20.31 kg/m    Physical Exam Vitals signs and nursing note reviewed.  Constitutional:      General: He is not in acute  distress.    Appearance: Normal appearance. He is not ill-appearing.  Neck:     Musculoskeletal: Normal range of motion and neck supple.     Vascular: No carotid bruit.  Cardiovascular:     Rate and Rhythm: Normal rate and regular rhythm.  Pulmonary:     Effort: Pulmonary effort is normal. No respiratory distress.     Breath sounds: Normal breath sounds. No wheezing or rhonchi.     Comments: Reproducible chest wall pain along the frontal junction of the ribs and sternum in the upper and mid chest.  Patient also has reproducible chest pain with range of motion of the arms which causes stretching of the chest wall muscles/pectoral muscles Chest:     Chest wall: Tenderness present.  Abdominal:     Palpations: Abdomen is soft.  Tenderness: There is no abdominal tenderness. There is no right CVA tenderness, left CVA tenderness, guarding or rebound.  Musculoskeletal:        General: Tenderness present. No deformity.     Right lower leg: No edema.     Left lower leg: No edema.     Comments: Reproducible anterior chest wall tenderness  Lymphadenopathy:     Cervical: No cervical adenopathy.  Skin:    General: Skin is warm and dry.  Neurological:     General: No focal deficit present.     Mental Status: He is alert and oriented to person, place, and time.  Psychiatric:        Mood and Affect: Mood normal.        Behavior: Behavior normal.        Thought Content: Thought content normal.         ASSESSMENT AND PLAN: 1. Hospital discharge follow-up; 2. Chest pain, atypical; 3. Acute costochondritis He is status post hospital visit on 09/01/2018 secondary to onset of intermittent chest pain which was reproducible and thought to be secondary to costochondritis.  At today's visit, patient still with some reproducibility of chest pain.  Patient request refill of tramadol as per hospital discharge to help with chest pain.  New prescription provided.  He however also had witnessed syncopal  episodes x2 while in triage/ED as well as a run of SVT.  Patient currently has a cardiac monitor which she will wear for 30 days and has follow-up with cardiology scheduled.  Discussed costochondritis with the patient.  He is also currently on Eliquis after having a PE in April.  No evidence of PE on CTA done during recent hospitalization however there was no evidence of PE however patient did have abnormal CT scan showing pulmonary nodules and will need repeat CT scan in 4 to 6 months.  Hospital notes, labs and imaging were reviewed and discussed with the patient at today's visit. - Basic Metabolic Panel - CBC with Differential - traMADol (ULTRAM) 50 MG tablet; Take 1 tablet (50 mg total) by mouth every 12 (twelve) hours as needed for moderate pain.  Dispense: 30 tablet; Refill: 0  3. Syncope, unspecified syncope type Patient had witnessed syncopal episodes x2 during his hospitalization while in the emergency department/triage.  Patient had EEG to check for possible seizure due to some jerking during the second syncopal episode.  Patient also had a run of SVT during his hospitalization and is currently wearing a cardiac monitor.  Patient has follow-up scheduled with cardiology and referral will be made to neurology for further evaluation.  Patient will have recheck of BMP and CBC at today's visit in follow-up of his syncopal episodes.  He is aware that he was advised at hospital discharge that he cannot drive until after cleared by cardiology. - Basic Metabolic Panel - CBC with Differential - Ambulatory referral to Neurology   5. History of pulmonary embolism; 6. Long term use of anticoagulant Patient is status post hospitalization on 06/15/2018 due to onset of chest pain and patient was found to have pulmonary embolism.  Patient was discharged on Eliquis.  CTA done during recent admission did not show evidence of a pulmonary embolism.  Patient had CBC during hospitalization which showed hemoglobin of  14.5 but patient with decreased MCV of 65.8.  Patient's hemoglobin may have been elevated secondary to his tobacco use or possibly due to dehydration.  Will check iron panel due to low MCV.  Patient will also  be placed on Pepcid 20 mg twice daily for stomach protection to help prevent gastritis/ulcer or GI bleed related to his use of anticoagulant medication.  Will discuss timeframe in which to stop use of Eliquis at patient's follow-up visit after he has been evaluated by cardiology. - Iron, TIBC and Ferritin Panel - famotidine (PEPCID) 20 MG tablet; Take 1 tablet (20 mg total) by mouth 2 (two) times daily. For stomach protection  Dispense: 60 tablet; Refill: 6  7. Alcohol use Discussed the need for patient to decrease his alcohol consumption along with goal of alcohol cessation.  Patient was offered social work consult but he reports that he talked with someone during his hospitalization.    8. Essential hypertension Patient with hypertension and during his recent hospitalization he also had a short run of SVT.  Patient is currently wearing a cardiac monitor and has follow-up with cardiology. Blood pressure is reasonably controlled at today's visit.  Continue diltiazem and follow-up with cardiology.  Low-sodium/DASH diet encouraged. - diltiazem (CARDIZEM CD) 120 MG 24 hr capsule; Take 1 capsule (120 mg total) by mouth daily.  Dispense: 30 capsule; Refill: 5  9. Abnormal CT scan, chest; 10. Tobacco use Patient with abnormal chest CT on 09/01/2018 during his recent hospitalization.  Patient with subcentimeter irregular nodular opacities in both upper lobes near the apices most likely scarring.  Noncontrast chest CT at 3 to 6 months is recommended.  Patient was prescribed nicotine patches at time of hospital discharge and the need for complete smoking cessation was discussed at today's visit.  Patient will make follow-up appointment in approximately 4 to 6 weeks and at that visit, will also schedule patient  for future CT scan in follow-up of pulmonary nodules.  Future Appointments  Date Time Provider Department Center  10/28/2018  9:00 AM Van ClinesAquino, Karen M, MD [facility] None  10/30/2018  1:30 PM Cain SaupeFulp, Frona Yost, MD [facility] None  12/21/2018  1:30 PM [name], [name], MD [facility] None   An After Visit Summary was printed and given to the patient.  Return in about 6 weeks (around 10/30/2018) for chest pain/syncope-sooner if needed.   Cain Saupeammie Dejion Grillo, MD, Jerrel IvoryFACP

## 2018-09-18 NOTE — Patient Instructions (Signed)
Costochondritis Costochondritis is swelling and irritation (inflammation) of the tissue (cartilage) that connects your ribs to your breastbone (sternum). This causes pain in the front of your chest. Usually, the pain:  Starts gradually.  Is in more than one rib. This condition usually goes away on its own over time. Follow these instructions at home:  Do not do anything that makes your pain worse.  If directed, put ice on the painful area: ? Put ice in a plastic bag. ? Place a towel between your skin and the bag. ? Leave the ice on for 20 minutes, 2-3 times a day.  If directed, put heat on the affected area as often as told by your doctor. Use the heat source that your doctor tells you to use, such as a moist heat pack or a heating pad. ? Place a towel between your skin and the heat source. ? Leave the heat on for 20-30 minutes. ? Take off the heat if your skin turns bright red. This is very important if you cannot feel pain, heat, or cold. You may have a greater risk of getting burned.  Take over-the-counter and prescription medicines only as told by your doctor.  Return to your normal activities as told by your doctor. Ask your doctor what activities are safe for you.  Keep all follow-up visits as told by your doctor. This is important. Contact a doctor if:  You have chills or a fever.  Your pain does not go away or it gets worse.  You have a cough that does not go away. Get help right away if:  You are short of breath. This information is not intended to replace advice given to you by your health care provider. Make sure you discuss any questions you have with your health care provider. Document Released: 07/31/2007 Document Revised: 02/26/2017 Document Reviewed: 06/07/2015 Elsevier Patient Education  2020 [employer]  Syncope Syncope is when you pass out (faint) for a short time. It is caused by a sudden decrease in blood flow to the brain. Signs that you may be about  to pass out include:  Feeling dizzy or light-headed.  Feeling sick to your stomach (nauseous).  Seeing all white or all black.  Having cold, clammy skin. If you pass out, get help right away. Call your local emergency services (911 in the [place] Do not drive yourself to the hospital. Follow these instructions at home: Watch for any changes in your symptoms. Take these actions to stay safe and help with your symptoms: Lifestyle  Do not drive, use machinery, or play sports until your doctor says it is okay.  Do not drink alcohol.  Do not use any products that contain nicotine or tobacco, such as cigarettes and e-cigarettes. If you need help quitting, ask your doctor.  Drink enough fluid to keep your pee (urine) pale yellow. General instructions  Take over-the-counter and prescription medicines only as told by your doctor.  If you are taking blood pressure or heart medicine, sit up and stand up slowly. Spend a few minutes getting ready to sit and then stand. This can help you feel less dizzy.  Have someone stay with you until you feel stable.  If you start to feel like you might pass out, lie down right away and raise (elevate) your feet above the level of your heart. Breathe deeply and steadily. Wait until all of the symptoms are gone.  Keep all follow-up visits as told by your doctor. This is important. Get  help right away if:  You have a very bad headache.  You pass out once or more than once.  You have pain in your chest, belly, or back.  You have a very fast or uneven heartbeat (palpitations).  It hurts to breathe.  You are bleeding from your mouth or your bottom (rectum).  You have black or tarry poop (stool).  You have jerky movements that you cannot control (seizure).  You are confused.  You have trouble walking.  You are very weak.  You have vision problems. These symptoms may be an emergency. Do not wait to see if the symptoms will go away. Get medical  help right away. Call your local emergency services (911 in the [place] Do not drive yourself to the hospital. Summary  Syncope is when you pass out (faint) for a short time. It is caused by a sudden decrease in blood flow to the brain.  Signs that you may be about to faint include feeling dizzy, light-headed, or sick to your stomach, seeing all white or all black, or having cold, clammy skin.  If you start to feel like you might pass out, lie down right away and raise (elevate) your feet above the level of your heart. Breathe deeply and steadily. Wait until all of the symptoms are gone. This information is not intended to replace advice given to you by your health care provider. Make sure you discuss any questions you have with your health care provider. Document Released: 07/31/2007 Document Revised: 03/26/2017 Document Reviewed: 03/26/2017 Elsevier Patient Education  2020 [employer].

## 2018-09-19 LAB — CBC WITH DIFFERENTIAL/PLATELET
Basophils Absolute: 0.1 x10E3/uL (ref 0.0–0.2)
Basos: 1 %
EOS (ABSOLUTE): 0.2 x10E3/uL (ref 0.0–0.4)
Eos: 3 %
Hematocrit: 42.6 % (ref 37.5–51.0)
Hemoglobin: 13 g/dL (ref 13.0–17.7)
Immature Grans (Abs): 0 x10E3/uL (ref 0.0–0.1)
Immature Granulocytes: 0 %
Lymphocytes Absolute: 1.7 x10E3/uL (ref 0.7–3.1)
Lymphs: 25 %
MCH: 21.3 pg — ABNORMAL LOW (ref 26.6–33.0)
MCHC: 30.5 g/dL — ABNORMAL LOW (ref 31.5–35.7)
MCV: 70 fL — ABNORMAL LOW (ref 79–97)
Monocytes Absolute: 0.6 x10E3/uL (ref 0.1–0.9)
Monocytes: 9 %
Neutrophils Absolute: 4.1 x10E3/uL (ref 1.4–7.0)
Neutrophils: 62 %
Platelets: 440 x10E3/uL (ref 150–450)
RBC: 6.11 x10E6/uL — ABNORMAL HIGH (ref 4.14–5.80)
RDW: 21.2 % — ABNORMAL HIGH (ref 11.6–15.4)
WBC: 6.7 x10E3/uL (ref 3.4–10.8)

## 2018-09-19 LAB — BASIC METABOLIC PANEL WITH GFR
BUN/Creatinine Ratio: 15 (ref 9–20)
BUN: 14 mg/dL (ref 6–24)
CO2: 22 mmol/L (ref 20–29)
Calcium: 9.9 mg/dL (ref 8.7–10.2)
Chloride: 103 mmol/L (ref 96–106)
Creatinine, Ser: 0.95 mg/dL (ref 0.76–1.27)
GFR calc Af Amer: 104 mL/min/1.73
GFR calc non Af Amer: 90 mL/min/1.73
Glucose: 84 mg/dL (ref 65–99)
Potassium: 4.1 mmol/L (ref 3.5–5.2)
Sodium: 140 mmol/L (ref 134–144)

## 2018-09-19 LAB — IRON,TIBC AND FERRITIN PANEL
Ferritin: 77 ng/mL (ref 30–400)
Iron Saturation: 21 % (ref 15–55)
Iron: 66 ug/dL (ref 38–169)
Total Iron Binding Capacity: 313 ug/dL (ref 250–450)
UIBC: 247 ug/dL (ref 111–343)

## 2018-09-22 ENCOUNTER — Encounter: Payer: Self-pay | Admitting: Neurology

## 2018-09-28 ENCOUNTER — Encounter: Payer: Self-pay | Admitting: Family Medicine

## 2018-09-29 ENCOUNTER — Telehealth: Payer: Self-pay

## 2018-09-29 ENCOUNTER — Telehealth: Payer: Self-pay | Admitting: Cardiovascular Disease

## 2018-09-29 NOTE — Telephone Encounter (Signed)
Spoke with patient regarding monitor (received phone # [phone] from a lady when I called [phone]). He reports he DID NOT pass out this AM and has not passed out since before he received the monitor. His chest did hurt him this AM, which he reports it does often and he does get lightheaded/dizzy, thus prompting the symptom triggers on the monitor. No further assistance needed at this time.

## 2018-09-29 NOTE — Telephone Encounter (Signed)
See previous 09/29/18 telephone note.

## 2018-09-29 NOTE — Telephone Encounter (Signed)
[employer] called with critical EKG reading.

## 2018-09-29 NOTE — Telephone Encounter (Signed)
Police called back- stated when they arrived at the house, there was no vehicle in the driveway and nobody answered the door- he walked around the house but did not see anyone in doors. They may not be at home at this time.

## 2018-09-29 NOTE — Telephone Encounter (Signed)
[employer] called and patient chose symptoms on the device that was for fatigued, and passing out episode. When they attempted to contact patient they were unable to do so- the result was unverified syncope with sinus with PAC's. I attempted to contact the patient as well on both home and mobile number- unable to reach patient, LVM on home number to please contact us back to verify if he is okay. Unable to leave message on mobile number as voicemail box was full. Will route to PA who ordered test, MD and nurse.   Called Police Department to go by to a welfare check on patient to make sure patient is doing okay, they will call me back once they have checked on patient at this time.

## 2018-10-05 MED FILL — traMADol HCL 50 MG TABS: 50 | 15 days supply | Qty: 30 | Fill #0

## 2018-10-05 MED FILL — DILTIAZEM 24HR ER 120 MG CA: 120 | 30 days supply | Qty: 30 | Fill #0

## 2018-10-05 MED FILL — NICOTINE 7 MG/24HR PATCH: 7 | 28 days supply | Qty: 28 | Fill #1

## 2018-10-15 ENCOUNTER — Other Ambulatory Visit: Payer: Self-pay

## 2018-10-28 ENCOUNTER — Ambulatory Visit: Payer: Self-pay | Admitting: Neurology

## 2018-10-30 ENCOUNTER — Ambulatory Visit: Payer: Self-pay | Admitting: Family Medicine

## 2018-11-03 ENCOUNTER — Telehealth: Payer: Self-pay | Admitting: *Deleted

## 2018-11-03 NOTE — Telephone Encounter (Signed)
-----   Message from [place], [place] sent at 11/03/2018  8:11 AM EDT ----- Monitor showed episodes of PSVT/atrial tach versus atrial  flutter. Needs office visit with Dr. Gwenlyn Found or APP while Dr. Gwenlyn Found in clinic for further discussion.

## 2018-11-03 NOTE — Telephone Encounter (Signed)
Call placed to pt re: monitor results, left a message for pt to call back.  

## 2018-11-05 NOTE — Telephone Encounter (Signed)
Pt has been made aware of his heart monitor results and scheduled to see Jory Sims, NP, 11/09/2018 and has been made aware to arrive at 2:00 for registration.

## 2018-11-08 NOTE — Progress Notes (Signed)
Cardiology Office Note   Date:  11/09/2018   ID:  Travis ForemanJohn L Reed, DOB Sep 06, 1965, MRN [id number]  PCP:  Travis Reed, Travis Reed  Cardiologist:  Travis Reed  No chief complaint on file.    History of Present Illness: Travis Reed is a 53 y.o. male who presents for posthospitalization follow-up after being seen on consultation by Dr. Nanetta BattyJonathan Reed, on 09/02/2018 in the setting of chest pain with known history of PE in April 2020 on Eliquis, hypertension, ongoing tobacco and alcohol abuse.  Echocardiogram was completed during hospitalization revealing EF of greater than 65%.  Cavity size was normal, LV diastolic pressures were normal.  Chest pain was found to be atypical, he has chronic soreness in his chest since his PE in April 2020.  The pain was reproducible with palpation.  He was ruled out for ACS.  There were no coronary calcifications noted on CTA of the chest during hospitalization. The patient did have a syncopal episode prior to coming to the hospital.  It was felt was related to EtOH.  The patient was planned for an outpatient cardiac event monitor and no driving for 6 months.  He was placed on long-acting diltiazem 120 mg daily as he was having episodes of tachycardia and hypertension.  Both of which improved prior to discharge.  Cardiac monitor on 10/28/2018 revealed episodes of PSVT/atrial tachycardia versus atrial flutter.  Maximum heart rate was 190 bpm, atrial flutter with 2-1 conduction was noted.  He is here for follow-up to discuss his results and adjust medications.  He states that he continues to have chest soreness and shoulder pain. 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 He went to [employer]. He can do this without over exerting or causing upper body pain.    Past Medical History:  Diagnosis Date  . Chest pain   . ETOH abuse   .  Hypertension   . Pulmonary embolism (HCC)   . Pulmonary nodules     Past Surgical History:  Procedure Laterality Date  . WRIST SURGERY Left      Current Outpatient Medications  Medication Sig Dispense Refill  . apixaban (ELIQUIS) 5 MG TABS tablet Take 1 tablet (5 mg total) by mouth 2 (two) times daily. 60 tablet 5  . diltiazem (CARDIZEM CD) 120 MG 24 hr capsule Take 1 capsule (120 mg total) by mouth daily. 30 capsule 5  . famotidine (PEPCID) 20 MG tablet Take 1 tablet (20 mg total) by mouth 2 (two) times daily. For stomach protection 60 tablet 6  . Multiple Vitamins-Minerals (ONE-A-DAY MENS VITACRAVES PO) Take 1 tablet by mouth daily.    . naproxen sodium (ALEVE) 220 MG tablet Take 220-440 mg by mouth 2 (two) times daily as needed (for pain).    . nicotine (NICODERM CQ - DOSED IN MG/24 HR) 7 mg/24hr patch Place 1 patch (7 mg total) onto the skin daily. 28 patch 1  . traMADol (ULTRAM) 50 MG tablet Take 1 tablet (50 mg total) by mouth every 12 (twelve) hours as needed for moderate pain. 30 tablet 0   No current facility-administered medications for this visit.     Allergies:   Penicillins    Social History:  The patient  reports that he has been smoking cigarettes. He has been smoking about 0.50 packs per day. He has never used smokeless tobacco. He  reports current alcohol use. He reports current drug use. Drug: Marijuana.   Family History:  The patient's family history includes CAD in his father; Diabetes in his mother; Hyperlipidemia in his mother.    ROS: All other systems are reviewed and negative. Unless otherwise mentioned in H&P    PHYSICAL EXAM: VS:  BP (!) 143/85   Pulse 79   Ht 6\' 2"  (1.88 m)   Wt 154 lb (69.9 kg)   SpO2 99%   BMI 19.77 kg/m  , BMI Body mass index is 19.77 kg/m. GEN: Well nourished, well developed, in no acute distress HEENT: normal Neck: no JVD, carotid bruits, or masses Cardiac: RRR; no murmurs, rubs, or gallops,no edema  Respiratory:  Clear  to auscultation bilaterally, normal work of breathing GI: soft, nontender, nondistended, + BS MS: no deformity or atrophy Skin: warm and dry, no rash Neuro:  Strength and sensation are intact Psych: euthymic mood, full affect   EKG:  Not completed this office visit.   Recent Labs: 09/01/2018: ALT 20 09/02/2018: TSH 1.791 09/18/2018: BUN 14; Creatinine, Ser 0.95; Hemoglobin 13.0; Platelets 440; Potassium 4.1; Sodium 140    Lipid Panel No results found for: CHOL, TRIG, HDL, CHOLHDL, VLDL, LDLCALC, LDLDIRECT    Wt Readings from Last 3 Encounters:  11/09/18 154 lb (69.9 kg)  09/18/18 158 lb 3.2 oz (71.8 kg)  09/03/18 150 lb 12.8 oz (68.4 kg)      Other studies Reviewed: Cardiac Monitor 10/28/2018 Study Highlights  1: Sinus rhythm/sinus tachycardia/sinus bradycardia 2: Occasional PACs 3: Episodes of PSVT/atrial tach versus a flutter with 2-1 conduction 4: Needs return office visit to discuss    Echocardiogram 09/02/2018 1. The left ventricle has hyperdynamic systolic function, with an ejection fraction of >65%. The cavity size was normal. Left ventricular diastolic parameters were normal. No evidence of left ventricular regional wall motion abnormalities.  2. The right ventricle has normal systolic function. The cavity was normal. There is no increase in right ventricular wall thickness.   ASSESSMENT AND PLAN:  1. Hx of PE: No further episodes of severe chest pain or dyspnea. He continues on Eliquis. No bleeding. He receives his medication from community [facility].    2. Chronic Chest Pain: Appears more musculoskeletal pain.  He is advised not to lift heavy objects causing strain.   3.Hypertension:  Continue on diltiazem for ongoing management. BP slightly elevated today. Will follow.    4.Tobacco abuse: Cutting back to less than a pack a week. Continues to use nicotine patch.    Current medicines are reviewed at length with the patient today.    Labs/ tests ordered today  include: None  Travis Reed. Travis Reed, ANP, AACC   11/09/2018 3:06 PM    [facility] [address] Office [phone] Fax [phone]

## 2018-11-09 ENCOUNTER — Other Ambulatory Visit: Payer: Self-pay

## 2018-11-09 ENCOUNTER — Ambulatory Visit (INDEPENDENT_AMBULATORY_CARE_PROVIDER_SITE_OTHER): Payer: Self-pay | Admitting: Adult Health

## 2018-11-09 ENCOUNTER — Other Ambulatory Visit: Payer: Self-pay | Admitting: *Deleted

## 2018-11-09 ENCOUNTER — Encounter: Payer: Self-pay | Admitting: Adult Health

## 2018-11-09 VITALS — BP 143/85 | HR 79 | Ht 74.0 in | Wt 154.0 lb

## 2018-11-09 DIAGNOSIS — R0789 Other chest pain: Secondary | ICD-10-CM

## 2018-11-09 DIAGNOSIS — F17200 Nicotine dependence, unspecified, uncomplicated: Secondary | ICD-10-CM

## 2018-11-09 DIAGNOSIS — I10 Essential (primary) hypertension: Secondary | ICD-10-CM

## 2018-11-09 DIAGNOSIS — I2699 Other pulmonary embolism without acute cor pulmonale: Secondary | ICD-10-CM

## 2018-11-09 MED FILL — DILTIAZEM 24HR ER 120 MG CA: 120 | 30 days supply | Qty: 30 | Fill #0

## 2018-11-09 MED FILL — traMADol HCL 50 MG TABS: 50 | 15 days supply | Qty: 30 | Fill #0

## 2018-11-09 MED FILL — NICOTINE 7 MG/24HR PATCH: 7 | 28 days supply | Qty: 28 | Fill #1

## 2018-11-09 MED FILL — !ELIQUIS 5MG TABLET: 5 | 30 days supply | Qty: 60 | Fill #0

## 2018-11-09 NOTE — Patient Instructions (Signed)
Medication Instructions:  Continue current medications  If you need a refill on your cardiac medications before your next appointment, please call your pharmacy.  Labwork: None Ordered   Testing/Procedures: None Ordered  Follow-Up: You will need a follow up appointment in 6 months.  Please call our office 2 months in advance to schedule this appointment.  You may see Dr Berry or one of the following Advanced Practice Providers on your designated Care Team:   Luke Kilroy, PA-C Krista Kroeger, PA-C . Callie Goodrich, PA-C     At [facility], you and your health needs are our priority.  As part of our continuing mission to provide you with exceptional heart care, we have created designated Provider Care Teams.  These Care Teams include your primary Cardiologist (physician) and Advanced Practice Providers (APPs -  Physician Assistants and Nurse Practitioners) who all work together to provide you with the care you need, when you need it.  Thank you for choosing [facility]!!     

## 2018-11-10 ENCOUNTER — Encounter: Payer: Self-pay | Admitting: Neurology

## 2018-11-12 ENCOUNTER — Telehealth: Payer: Self-pay | Admitting: Family Medicine

## 2018-11-12 NOTE — Telephone Encounter (Signed)
Medical records mailed to patients address-11/12/2018

## 2018-11-18 ENCOUNTER — Ambulatory Visit: Payer: Self-pay

## 2018-12-21 ENCOUNTER — Other Ambulatory Visit: Payer: Self-pay

## 2018-12-21 ENCOUNTER — Encounter: Payer: Self-pay | Admitting: Family Medicine

## 2018-12-21 ENCOUNTER — Ambulatory Visit: Payer: Self-pay | Attending: Family Medicine | Admitting: Family Medicine

## 2018-12-21 VITALS — BP 135/84 | HR 84 | Temp 98.4°F | Resp 18 | Ht 74.0 in | Wt 159.0 lb

## 2018-12-21 DIAGNOSIS — R0789 Other chest pain: Secondary | ICD-10-CM

## 2018-12-21 DIAGNOSIS — I10 Essential (primary) hypertension: Secondary | ICD-10-CM

## 2018-12-21 DIAGNOSIS — I2699 Other pulmonary embolism without acute cor pulmonale: Secondary | ICD-10-CM

## 2018-12-21 DIAGNOSIS — Z720 Tobacco use: Secondary | ICD-10-CM

## 2018-12-21 DIAGNOSIS — G8929 Other chronic pain: Secondary | ICD-10-CM

## 2018-12-21 DIAGNOSIS — M25512 Pain in left shoulder: Secondary | ICD-10-CM

## 2018-12-21 DIAGNOSIS — R918 Other nonspecific abnormal finding of lung field: Secondary | ICD-10-CM

## 2018-12-21 MED ORDER — CYCLOBENZAPRINE HCL 10 MG PO TABS: 10.0000 mg | ORAL_TABLET | Freq: Every day | ORAL | 2 refills | Status: DC

## 2018-12-21 MED ORDER — APIXABAN 5 MG PO TABS: 5.0000 mg | ORAL_TABLET | Freq: Two times a day (BID) | ORAL | 2 refills | Status: DC

## 2018-12-21 NOTE — Patient Instructions (Signed)
Shoulder Pain Many things can cause shoulder pain, including:  An injury.  Moving the shoulder in the same way again and again (overuse).  Joint pain (arthritis). Pain can come from:  Swelling and irritation (inflammation) of any part of the shoulder.  An injury to the shoulder joint.  An injury to: ? Tissues that connect muscle to bone (tendons). ? Tissues that connect bones to each other (ligaments). ? Bones. Follow these instructions at home: Watch for changes in your symptoms. Let your doctor know about them. Follow these instructions to help with your pain. If you have a sling:  Wear the sling as told by your doctor. Remove it only as told by your doctor.  Loosen the sling if your fingers: ? Tingle. ? Become numb. ? Turn cold and blue.  Keep the sling clean.  If the sling is not waterproof: ? Do not let it get wet. ? Take the sling off when you shower or bathe. Managing pain, stiffness, and swelling   If told, put ice on the painful area: ? Put ice in a plastic bag. ? Place a towel between your skin and the bag. ? Leave the ice on for 20 minutes, 2-3 times a day. Stop putting ice on if it does not help with the pain.  Squeeze a soft ball or a foam pad as much as possible. This prevents swelling in the shoulder. It also helps to strengthen the arm. General instructions  Take over-the-counter and prescription medicines only as told by your doctor.  Keep all follow-up visits as told by your doctor. This is important. Contact a doctor if:  Your pain gets worse.  Medicine does not help your pain.  You have new pain in your arm, hand, or fingers. Get help right away if:  Your arm, hand, or fingers: ? Tingle. ? Are numb. ? Are swollen. ? Are painful. ? Turn white or blue. Summary  Shoulder pain can be caused by many things. These include injury, moving the shoulder in the same away again and again, and joint pain.  Watch for changes in your symptoms.  Let your doctor know about them.  This condition may be treated with a sling, ice, and pain medicine.  Contact your doctor if the pain gets worse or you have new pain. Get help right away if your arm, hand, or fingers tingle or get numb, swollen, or painful.  Keep all follow-up visits as told by your doctor. This is important. This information is not intended to replace advice given to you by your health care provider. Make sure you discuss any questions you have with your health care provider. Document Released: 07/31/2007 Document Revised: 08/26/2017 Document Reviewed: 08/26/2017 Elsevier Patient Education  2020 Elsevier Inc.  

## 2018-12-21 NOTE — Progress Notes (Signed)
Established Patient Office Visit  Subjective:  Patient ID: Travis Reed, male    DOB: 29-Sep-1965  Age: 53 y.o. MRN: [id number]  CC:  Chief Complaint  Patient presents with  . Follow-up    HPI Travis Reed is a 53 year old male with a history of pulmonary embolism (06/23/2018), hypertension, chest pain, syncope and pulmonary nodule that presents today for a chronic follow up.  The patient is being anticoagulated with Eliquis and he denies any evidence of bleeding currently but has experienced bleeding gums in the past.  The plan was to stop the Eliquis but the patient endorses that both his parents are on "blood thinners" but he is unsure of the details.  He is going to call with that information and a decision will be made about continuation of Eliquis.  His blood pressure is controlled on Cardizem and he takes it as prescribed.  No syncope since his ER visit on 09/01/2018.  He is still having intermittent chest pain that he describes as a muscle tightness.  Pain is rated at a 8/10 but improves with stretching.  Does not radiate.  He has taken both Tramadol and Aleve which help the pain.  His last cardiology appointment was 11/09/2018 at [facility] and he will see them again in six months.  Today he is complaining of left shoulder pain that has been present for several months.  The pain is an intermittent ache, rated 3/10.  The pain is worse in the morning and improves with movement.  The Aleve and Tramadol that he takes for the chest pain help his shoulder pain as well.    Past Medical History:  Diagnosis Date  . Chest pain   . ETOH abuse   . Hypertension   . Pulmonary embolism (HCC)   . Pulmonary nodules     Past Surgical History:  Procedure Laterality Date  . WRIST SURGERY Left     Family History  Problem Relation Age of Onset  . Diabetes Mother   . Hyperlipidemia Mother   . CAD Father     Social History   Socioeconomic History  . Marital status: Married   Spouse name: Not on file  . Number of children: Not on file  . Years of education: Not on file  . Highest education level: Not on file  Occupational History  . Not on file  Social Needs  . Financial resource strain: Not on file  . Food insecurity    Worry: Not on file    Inability: Not on file  . Transportation needs    Medical: Not on file    Non-medical: Not on file  Tobacco Use  . Smoking status: Current Every Day Smoker    Packs/day: 0.50    Types: Cigarettes  . Smokeless tobacco: Never Used  Substance and Sexual Activity  . Alcohol use: Yes    Comment: occasinal  . Drug use: Yes    Types: Marijuana  . Sexual activity: Not Currently  Lifestyle  . Physical activity    Days per week: Not on file    Minutes per session: Not on file  . Stress: Not on file  Relationships  . Social Musician on phone: Not on file    Gets together: Not on file    Attends religious service: Not on file    Active member of club or organization: Not on file    Attends meetings of clubs or organizations: Not on file  Relationship status: Not on file  . Intimate partner violence    Fear of current or ex partner: Not on file    Emotionally abused: Not on file    Physically abused: Not on file    Forced sexual activity: Not on file  Other Topics Concern  . Not on file  Social History Narrative  . Not on file    Outpatient Medications Prior to Visit  Medication Sig Dispense Refill  . diltiazem (CARDIZEM CD) 120 MG 24 hr capsule Take 1 capsule (120 mg total) by mouth daily. 30 capsule 5  . famotidine (PEPCID) 20 MG tablet Take 1 tablet (20 mg total) by mouth 2 (two) times daily. For stomach protection 60 tablet 6  . Multiple Vitamins-Minerals (ONE-A-DAY MENS VITACRAVES PO) Take 1 tablet by mouth daily.    . nicotine (NICODERM CQ - DOSED IN MG/24 HR) 7 mg/24hr patch Place 1 patch (7 mg total) onto the skin daily. 28 patch 1  . traMADol (ULTRAM) 50 MG tablet Take 1 tablet (50 mg  total) by mouth every 12 (twelve) hours as needed for moderate pain. 30 tablet 0  . naproxen sodium (ALEVE) 220 MG tablet Take 220-440 mg by mouth 2 (two) times daily as needed (for pain).    Marland Kitchen apixaban (ELIQUIS) 5 MG TABS tablet Take 1 tablet (5 mg total) by mouth 2 (two) times daily. 60 tablet 5   No facility-administered medications prior to visit.     Allergies  Allergen Reactions  . Penicillins Other (See Comments)    Unknown childhood reaction: Did it involve swelling of the face/tongue/throat, SOB, or low BP? Unk Did it involve sudden or severe rash/hives, skin peeling, or any reaction on the inside of your mouth or nose? Unk Did you need to seek medical attention at a hospital or doctor's office? Unk When did it last happen? "I was a child" If all above answers are "NO", may proceed with cephalosporin use.     ROS Review of Systems  Constitutional: Negative for fatigue, fever and unexpected weight change.  HENT: Negative for congestion, rhinorrhea, sinus pressure and sinus pain.   Eyes: Negative for visual disturbance.  Respiratory: Negative for cough, chest tightness and shortness of breath.   Cardiovascular: Negative for chest pain, palpitations and leg swelling.  Gastrointestinal: Negative for abdominal distention, abdominal pain, constipation, diarrhea, nausea and vomiting.  Endocrine: Negative for polydipsia and polyuria.  Genitourinary: Negative for decreased urine volume, difficulty urinating and dysuria.  Musculoskeletal: Positive for arthralgias and myalgias.       Left shoulder pain Chest wall muscle pain  Skin: Negative for color change and rash.  Neurological: Negative for tremors, weakness and numbness.  Hematological: Does not bruise/bleed easily.  Psychiatric/Behavioral: Negative for agitation and behavioral problems.      Objective:    BP 135/84 (BP Location: Left Arm, Patient Position: Sitting, Cuff Size: Normal)   Pulse 84   Temp 98.4 F (36.9 C)  (Oral)   Resp 18   Ht  (1.88 m)   Wt 159 lb (72.1 kg)   SpO2 98%   BMI 20.41 kg/m   Physical Exam  Constitutional: He is oriented to person, place, and time. He appears well-developed and well-nourished. No distress.  HENT:  Head: Normocephalic and atraumatic.  Eyes: Pupils are equal, round, and reactive to light. Conjunctivae and EOM are normal.  Neck: Normal range of motion. Neck supple.  Cardiovascular: Normal rate, regular rhythm, normal heart sounds and intact distal pulses.  No murmur heard. Pulmonary/Chest: Effort normal and breath sounds normal. No respiratory distress. He exhibits tenderness.  Reproducible anterior chest wall tenderness   Abdominal: Soft. Bowel sounds are normal. He exhibits no distension. There is no abdominal tenderness.  Musculoskeletal: Normal range of motion.        General: No edema.     Left shoulder: He exhibits tenderness, crepitus and pain. He exhibits no swelling and normal strength.  Neurological: He is alert and oriented to person, place, and time.  Skin: Skin is warm and dry. No rash noted.  Psychiatric: He has a normal mood and affect. His behavior is normal.     Health Maintenance Due  Topic Date Due  . COLONOSCOPY  06/12/2013    There are no preventive care reminders to display for this patient.  Lab Results  Component Value Date   TSH 1.791 09/02/2018   Lab Results  Component Value Date   WBC 6.7 09/18/2018   HGB 13.0 09/18/2018   HCT 42.6 09/18/2018   MCV 70 (L) 09/18/2018   PLT 440 09/18/2018   Lab Results  Component Value Date   NA 140 09/18/2018   K 4.1 09/18/2018   CO2 22 09/18/2018   GLUCOSE 84 09/18/2018   BUN 14 09/18/2018   CREATININE 0.95 09/18/2018   BILITOT 1.1 09/01/2018   ALKPHOS 73 09/01/2018   AST 29 09/01/2018   ALT 20 09/01/2018   PROT 7.7 09/01/2018   ALBUMIN 4.1 09/01/2018   CALCIUM 9.9 09/18/2018   ANIONGAP 14 09/01/2018   No results found for: CHOL No results found for: HDL No  results found for: LDLCALC No results found for: TRIG No results found for: CHOLHDL No results found for: [id number]    Assessment & Plan:   1. Pulmonary embolism, other, unspecified chronicity, unspecified whether acute cor pulmonale present (HCC) Unprovoked PE Continue Eliquis Plan at discharge was for anticoagulation with 6 months which is 11/2018 but it is unclear about his family history of thromboembolic disease. Advised to inquire with his family about a family history of thromboembolic disease which will necessitate lifelong anticoagulation - apixaban (ELIQUIS) 5 MG TABS tablet; Take 1 tablet (5 mg total) by mouth 2 (two) times daily.  Dispense: 60 tablet; Refill: 2  2. Abnormal findings on diagnostic imaging of lung Follow up on lung nodules found on CT completed 09/01/2018 - CT CHEST NODULE FOLLOW UP LOW DOSE W/O; Future  3. Essential hypertension Controlled Continue current medication regimen Counseled on blood pressure goal of less than 130/80, low-sodium, DASH diet, medication compliance, 150 minutes of moderate intensity exercise per week. Discussed medication compliance, adverse effects.  4. Tobacco use Counseled on tobacco cessation for approximately 3 minutes He has reduced the number of cigarettes that he smokes to 3 per day Continue nicotine patches  5. Musculoskeletal chest pain Uncontrolled Begin cyclobenzaprine Stop Aleve due to bleeding risk with Eliquis Stop Tramadol - cyclobenzaprine (FLEXERIL) 10 MG tablet; Take 1 tablet (10 mg total) by mouth at bedtime.  Dispense: 30 tablet; Refill: 2  6. Chronic left shoulder pain Suspect osteoarthritis Follow up if no improvement Avoid NSAIDs due to increased bleeding risk with Eliquis. - cyclobenzaprine (FLEXERIL) 10 MG tablet; Take 1 tablet (10 mg total) by mouth at bedtime.  Dispense: 30 tablet; Refill: 2   Meds ordered this encounter  Medications  . apixaban (ELIQUIS) 5 MG TABS tablet    Sig: Take 1 tablet (5  mg total) by mouth 2 (two) times daily.  Dispense:  60 tablet    Refill:  2  . cyclobenzaprine (FLEXERIL) 10 MG tablet    Sig: Take 1 tablet (10 mg total) by mouth at bedtime.    Dispense:  30 tablet    Refill:  2    Follow-up: Return in about 3 months (around 03/23/2019) for Chronic medical conditions.    Tomasita Morrow, RN

## 2018-12-24 ENCOUNTER — Ambulatory Visit (HOSPITAL_COMMUNITY): Admission: RE | Admit: 2018-12-24 | Payer: Self-pay | Source: Ambulatory Visit

## 2019-01-27 ENCOUNTER — Ambulatory Visit: Payer: Self-pay | Admitting: Neurology

## 2019-02-15 MED FILL — $ELIQUIS 5 MG TABLET: 5 | 90 days supply | Qty: 180 | Fill #3

## 2019-02-22 ENCOUNTER — Emergency Department (HOSPITAL_COMMUNITY): Payer: Self-pay

## 2019-02-22 ENCOUNTER — Emergency Department (HOSPITAL_COMMUNITY)
Admission: EM | Admit: 2019-02-22 | Discharge: 2019-02-22 | Disposition: A | Discharge status: Home / Self Care | Payer: Self-pay | Attending: Emergency Medicine | Admitting: Emergency Medicine

## 2019-02-22 ENCOUNTER — Encounter (HOSPITAL_COMMUNITY): Payer: Self-pay | Admitting: Emergency Medicine

## 2019-02-22 ENCOUNTER — Other Ambulatory Visit: Payer: Self-pay

## 2019-02-22 DIAGNOSIS — I10 Essential (primary) hypertension: Secondary | ICD-10-CM | POA: Insufficient documentation

## 2019-02-22 DIAGNOSIS — Z20828 Contact with and (suspected) exposure to other viral communicable diseases: Secondary | ICD-10-CM | POA: Insufficient documentation

## 2019-02-22 DIAGNOSIS — R0600 Dyspnea, unspecified: Secondary | ICD-10-CM | POA: Insufficient documentation

## 2019-02-22 DIAGNOSIS — Z7901 Long term (current) use of anticoagulants: Secondary | ICD-10-CM | POA: Insufficient documentation

## 2019-02-22 DIAGNOSIS — F17210 Nicotine dependence, cigarettes, uncomplicated: Secondary | ICD-10-CM | POA: Insufficient documentation

## 2019-02-22 DIAGNOSIS — R0789 Other chest pain: Secondary | ICD-10-CM | POA: Insufficient documentation

## 2019-02-22 DIAGNOSIS — Z79899 Other long term (current) drug therapy: Secondary | ICD-10-CM | POA: Insufficient documentation

## 2019-02-22 LAB — URINALYSIS, ROUTINE W REFLEX MICROSCOPIC
Bacteria, UA: NONE SEEN
Bilirubin Urine: NEGATIVE
Glucose, UA: NEGATIVE mg/dL
Hgb urine dipstick: NEGATIVE
Ketones, ur: NEGATIVE mg/dL
Leukocytes,Ua: NEGATIVE
Nitrite: NEGATIVE
Protein, ur: NEGATIVE mg/dL
Specific Gravity, Urine: 1.008 (ref 1.005–1.030)
pH: 6 (ref 5.0–8.0)

## 2019-02-22 LAB — CBC
HCT: 42.5 % (ref 39.0–52.0)
Hemoglobin: 14.4 g/dL (ref 13.0–17.0)
MCH: 22.3 pg — ABNORMAL LOW (ref 26.0–34.0)
MCHC: 33.9 g/dL (ref 30.0–36.0)
MCV: 65.7 fL — ABNORMAL LOW (ref 80.0–100.0)
Platelets: 306 10*3/uL (ref 150–400)
RBC: 6.47 MIL/uL — ABNORMAL HIGH (ref 4.22–5.81)
RDW: 16 % — ABNORMAL HIGH (ref 11.5–15.5)
WBC: 7.1 10*3/uL (ref 4.0–10.5)
nRBC: 0 % (ref 0.0–0.2)

## 2019-02-22 LAB — POC SARS CORONAVIRUS 2 AG -  ED: SARS Coronavirus 2 Ag: NEGATIVE

## 2019-02-22 LAB — TROPONIN I (HIGH SENSITIVITY)
Troponin I (High Sensitivity): 4 ng/L (ref <18)
Troponin I (High Sensitivity): 5 ng/L (ref <18)

## 2019-02-22 LAB — D-DIMER, QUANTITATIVE: D-Dimer, Quant: 0.39 ug/mL-FEU (ref 0.00–0.50)

## 2019-02-22 LAB — BASIC METABOLIC PANEL
Anion gap: 17 — ABNORMAL HIGH (ref 5–15)
BUN: 7 mg/dL (ref 6–20)
CO2: 20 mmol/L — ABNORMAL LOW (ref 22–32)
Calcium: 9.8 mg/dL (ref 8.9–10.3)
Chloride: 99 mmol/L (ref 98–111)
Creatinine, Ser: 1.17 mg/dL (ref 0.61–1.24)
GFR calc Af Amer: >60 mL/min (ref >60)
GFR calc non Af Amer: >60 mL/min (ref >60)
Glucose, Bld: 97 mg/dL (ref 70–99)
Potassium: 4.6 mmol/L (ref 3.5–5.1)
Sodium: 136 mmol/L (ref 135–145)

## 2019-02-22 MED ORDER — SODIUM CHLORIDE 0.9% FLUSH
3.0000 mL | Freq: Once | INTRAVENOUS | Status: AC
  Administered 2019-02-22: 3 mL via INTRAVENOUS

## 2019-02-22 MED ORDER — IOHEXOL 350 MG/ML SOLN
100.0000 mL | Freq: Once | INTRAVENOUS | Status: AC | PRN
  Administered 2019-02-22: 11:00:00 72 mL via INTRAVENOUS

## 2019-02-22 NOTE — ED Provider Notes (Addendum)
[facility] EMERGENCY [facility] Provider Note   CSN: [id number] Arrival date & time: 02/22/19  [id number]     History Chief Complaint  Patient presents with  . Chest Pain  . Shortness of Breath    Travis Reed is a 53 y.o. male.  HPI Patient presents with shortness of breath.  Has had for last couple days.  Also some pain in his left chest and right posterior chest/CVA area.  States feels like when he had his pulmonary embolisms.  Has been out of his Eliquis for about a week including all his medicines.  Patient is visibly short of breath.  No swelling his legs.  Pain is dull in the upper chest more sharp posteriorly.    Past Medical History:  Diagnosis Date  . Chest pain   . ETOH abuse   . Hypertension   . Pulmonary embolism (HCC)   . Pulmonary nodules     Patient Active Problem List   Diagnosis Date Noted  . Essential hypertension 09/02/2018  . Syncope 09/01/2018  . Alcohol intoxication (HCC) 09/01/2018  . Pulmonary nodules 09/01/2018  . Chest pain 09/01/2018  . Smoker 06/25/2018  . Pulmonary embolism (HCC) 06/15/2018    Past Surgical History:  Procedure Laterality Date  . WRIST SURGERY Left        Family History  Problem Relation Age of Onset  . Diabetes Mother   . Hyperlipidemia Mother   . CAD Father     Social History   Tobacco Use  . Smoking status: Current Every Day Smoker    Packs/day: 0.25    Types: Cigarettes  . Smokeless tobacco: Never Used  Substance Use Topics  . Alcohol use: Yes    Comment: 1-2 cans of beer -daily  . Drug use: Yes    Types: Marijuana    Home Medications Prior to Admission medications   Medication Sig Start Date End Date Taking? Authorizing Provider  apixaban (ELIQUIS) 5 MG TABS tablet Take 1 tablet (5 mg total) by mouth 2 (two) times daily. 12/21/18  Yes Newlin, Odette HornsEnobong, MD  nicotine (NICODERM CQ - DOSED IN MG/24 HR) 7 mg/24hr patch Place 1 patch (7 mg total) onto the skin daily. 07/21/18  Yes  [name], [name], MD  cyclobenzaprine (FLEXERIL) 10 MG tablet Take 1 tablet (10 mg total) by mouth at bedtime. Patient not taking: Reported on 02/22/2019 12/21/18   [name], [name], MD  diltiazem (CARDIZEM CD) 120 MG 24 hr capsule Take 1 capsule (120 mg total) by mouth daily. Patient not taking: Reported on 02/22/2019 09/18/18   Cain SaupeFulp, Cammie, MD  famotidine (PEPCID) 20 MG tablet Take 1 tablet (20 mg total) by mouth 2 (two) times daily. For stomach protection Patient not taking: Reported on 02/22/2019 09/18/18   Fulp, Hewitt Shortsammie, MD  traMADol (ULTRAM) 50 MG tablet Take 1 tablet (50 mg total) by mouth every 12 (twelve) hours as needed for moderate pain. Patient not taking: Reported on 02/22/2019 09/18/18   Cain SaupeFulp, Cammie, MD    Allergies    Penicillins  Review of Systems   Review of Systems  Constitutional: Negative for appetite change.  HENT: Negative for congestion.   Respiratory: Positive for shortness of breath.   Cardiovascular: Positive for chest pain.  Gastrointestinal: Negative for abdominal pain.  Endocrine: Negative for polyuria.  Genitourinary: Positive for flank pain.  Musculoskeletal: Negative for back pain.  Skin: Negative for pallor.  Neurological: Negative for weakness.  Psychiatric/Behavioral: Negative for confusion.    Physical Exam Updated  Vital Signs BP (!) 145/80   Pulse 89   Temp 98.5 F (36.9 C) (Oral)   Resp 10   Ht 6\' 3"  (1.905 m)   Wt 74.8 kg   SpO2 99%   BMI 20.62 kg/m   Physical Exam Vitals and nursing note reviewed.  HENT:     Head: Atraumatic.  Cardiovascular:     Rate and Rhythm: Tachycardia present.  Pulmonary:     Breath sounds: Normal breath sounds.  Chest:     Comments: Mild tenderness right lower posterior chest. Abdominal:     Palpations: Abdomen is soft.     Tenderness: There is no abdominal tenderness.  Musculoskeletal:     Cervical back: Neck supple.     Right lower leg: No edema.     Left lower leg: No edema.  Skin:    General:  Skin is warm.     Capillary Refill: Capillary refill takes less than 2 seconds.  Neurological:     Mental Status: He is alert and oriented to person, place, and time.     ED Results / Procedures / Treatments   Labs (all labs ordered are listed, but only abnormal results are displayed) Labs Reviewed  BASIC METABOLIC PANEL - Abnormal; Notable for the following components:      Result Value   CO2 20 (*)    Anion gap 17 (*)    All other components within normal limits  CBC - Abnormal; Notable for the following components:   RBC 6.47 (*)    MCV 65.7 (*)    MCH 22.3 (*)    RDW 16.0 (*)    All other components within normal limits  NOVEL CORONAVIRUS, NAA (HOSP ORDER, SEND-OUT TO REF LAB; TAT 18-24 HRS)  D-DIMER, QUANTITATIVE (NOT AT [facility])  URINALYSIS, ROUTINE W REFLEX MICROSCOPIC  POC SARS CORONAVIRUS 2 AG -  ED  TROPONIN I (HIGH SENSITIVITY)  TROPONIN I (HIGH SENSITIVITY)    EKG EKG Interpretation  Date/Time:  Monday February 22 2019 09:11:46 EST Ventricular Rate:  85 PR Interval:  138 QRS Duration: 73 QT Interval:  366 QTC Calculation: 436 R Axis:   80 Text Interpretation: Sinus rhythm Confirmed by 01-27-1997 [phone]) on 02/22/2019 10:13:37 AM   Radiology DG Chest 2 View  Result Date: 02/22/2019 CLINICAL DATA:  Shortness of breath EXAM: CHEST - 2 VIEW COMPARISON:  09/01/2018 FINDINGS: The heart size and mediastinal contours are within normal limits. Both lungs are clear. No pleural effusion or pneumothorax. The visualized skeletal structures are unremarkable. IMPRESSION: No acute process in the chest. Electronically Signed   By: 11/02/2018 M.D.   On: 02/22/2019 08:58   CT Angio Chest PE W and/or Wo Contrast  Result Date: 02/22/2019 CLINICAL DATA:  Chest pain and shortness of breath. High probability of pulmonary embolism EXAM: CT ANGIOGRAPHY CHEST WITH CONTRAST TECHNIQUE: Multidetector CT imaging of the chest was performed using the standard protocol during  bolus administration of intravenous contrast. Multiplanar CT image reconstructions and MIPs were obtained to evaluate the vascular anatomy. CONTRAST:  70mL OMNIPAQUE IOHEXOL 350 MG/ML SOLN COMPARISON:  09/01/2018 FINDINGS: Cardiovascular: Satisfactory opacification of the pulmonary arteries to the segmental level. No evidence of pulmonary embolism. Normal heart size. No pericardial effusion. Mediastinum/Nodes: Negative for adenopathy or mass. Lungs/Pleura: Biapical irregular shaped and sub solid nodular densities numbering 2 on the left and 1 on the right. These show a pleural scar like appearance. These are not progressive in the interim measure up to 1 cm  at the right apex. There is no edema, consolidation, effusion, or pneumothorax. Upper Abdomen: Negative Musculoskeletal: Negative Review of the MIP images confirms the above findings. IMPRESSION: 1. Negative for pulmonary embolism or other acute finding. 2. Unchanged sub-solid biapical pulmonary nodules. Recommend noncontrast chest CT in 1 year. Electronically Signed   By: Monte Fantasia M.D.   On: 02/22/2019 11:20    Procedures Procedures (including critical care time)  Medications Ordered in ED Medications  sodium chloride flush (NS) 0.9 % injection 3 mL (3 mLs Intravenous Given 02/22/19 0918)  iohexol (OMNIPAQUE) 350 MG/ML injection 100 mL (72 mLs Intravenous Contrast Given 02/22/19 1101)    ED Course  I have reviewed the triage vital signs and the nursing notes.  Pertinent labs & imaging results that were available during my care of the patient were reviewed by me and considered in my medical decision making (see chart for details).    MDM Rules/Calculators/A&P                      Patient with shortness of breath.  History of PE.  Has been off his medications however.  Out due to cost.  Complaining of pain in the left chest and right posterior chest wall.  EKG reassuring.  Did have some tachycardia initially and was dyspneic.  CT scan  done due to high pretest probability of pulmonary embolism.  Negative CT.  Troponin negative x2.  Feel as if stable for discharge home.  Will discuss with transitions of care due to the patient's being out of his medicines. Transition of care had arranged patient to get Xarelto, however patient states he did have some [facility]  Will discharge. Final Clinical Impression(s) / ED Diagnoses Final diagnoses:  Dyspnea, unspecified type    Rx / DC Orders ED Discharge Orders    None       Davonna Belling, MD 02/22/19 1400    Davonna Belling, MD 02/22/19 1440

## 2019-02-22 NOTE — Discharge Instructions (Addendum)
[facility].

## 2019-02-22 NOTE — ED Triage Notes (Addendum)
C/o chest pain, shortness of breath states "feels like I did when I had a blood clot in my lungs"  Shortness of breath with any movement .  Has been out of elaquis x 1 week, is out of all meds-- "I didn't have any money to pay for it"

## 2019-02-22 NOTE — ED Notes (Signed)
Patient verbalizes understanding of discharge instructions. Opportunity for questioning and answers were provided. Armband removed by staff, pt discharged from ED to home via POV  

## 2019-02-22 NOTE — ED Notes (Signed)
Pt ambulate around yellow zone twice 02 100% and and hr 111. Pt stated he felt fine by the second round pt stated his chest started to hurt and he was becoming a little SOB. Pt 02 remained 100% and pt hr was 98 when we was stopped at room. Pt the ambulated to RR and is now back in room at this time 1:41pm

## 2019-02-22 NOTE — ED Notes (Signed)
Patient transported to CT 

## 2019-02-22 NOTE — Discharge Planning (Signed)
[facility] met with pt at bedside regarding medication assistance for Eliquis.  Pt states he has Eliquis Rx awaiting pickup at [facility] and will secure it when he is discharged.  He was on his way to pick up when he felt like he was having an episode and decided to come get checked out in the ED.

## 2019-02-23 LAB — NOVEL CORONAVIRUS, NAA (HOSP ORDER, SEND-OUT TO REF LAB; TAT 18-24 HRS): SARS-CoV-2, NAA: NOT DETECTED

## 2019-03-23 ENCOUNTER — Ambulatory Visit: Payer: Self-pay | Admitting: Family Medicine

## 2019-04-06 ENCOUNTER — Encounter (HOSPITAL_COMMUNITY): Payer: Self-pay | Admitting: Emergency Medicine

## 2019-04-06 ENCOUNTER — Emergency Department (HOSPITAL_COMMUNITY)
Admission: EM | Admit: 2019-04-06 | Discharge: 2019-04-06 | Disposition: A | Discharge status: Home / Self Care | Payer: Self-pay | Attending: Emergency Medicine | Admitting: Emergency Medicine

## 2019-04-06 ENCOUNTER — Ambulatory Visit: Payer: Self-pay | Admitting: Family Medicine

## 2019-04-06 ENCOUNTER — Emergency Department (HOSPITAL_COMMUNITY): Payer: Self-pay

## 2019-04-06 ENCOUNTER — Other Ambulatory Visit: Payer: Self-pay

## 2019-04-06 DIAGNOSIS — R0602 Shortness of breath: Secondary | ICD-10-CM | POA: Insufficient documentation

## 2019-04-06 DIAGNOSIS — F10230 Alcohol dependence with withdrawal, uncomplicated: Secondary | ICD-10-CM | POA: Insufficient documentation

## 2019-04-06 DIAGNOSIS — R079 Chest pain, unspecified: Secondary | ICD-10-CM

## 2019-04-06 DIAGNOSIS — F10930 Alcohol use, unspecified with withdrawal, uncomplicated: Secondary | ICD-10-CM

## 2019-04-06 DIAGNOSIS — Z86711 Personal history of pulmonary embolism: Secondary | ICD-10-CM | POA: Insufficient documentation

## 2019-04-06 DIAGNOSIS — Z7901 Long term (current) use of anticoagulants: Secondary | ICD-10-CM | POA: Insufficient documentation

## 2019-04-06 DIAGNOSIS — R42 Dizziness and giddiness: Secondary | ICD-10-CM | POA: Insufficient documentation

## 2019-04-06 DIAGNOSIS — I10 Essential (primary) hypertension: Secondary | ICD-10-CM | POA: Insufficient documentation

## 2019-04-06 DIAGNOSIS — F17210 Nicotine dependence, cigarettes, uncomplicated: Secondary | ICD-10-CM | POA: Insufficient documentation

## 2019-04-06 DIAGNOSIS — Z79899 Other long term (current) drug therapy: Secondary | ICD-10-CM | POA: Insufficient documentation

## 2019-04-06 DIAGNOSIS — Z20822 Contact with and (suspected) exposure to covid-19: Secondary | ICD-10-CM | POA: Insufficient documentation

## 2019-04-06 LAB — BASIC METABOLIC PANEL
Anion gap: 21 — ABNORMAL HIGH (ref 5–15)
BUN: 9 mg/dL (ref 6–20)
CO2: 18 mmol/L — ABNORMAL LOW (ref 22–32)
Calcium: 9.6 mg/dL (ref 8.9–10.3)
Chloride: 94 mmol/L — ABNORMAL LOW (ref 98–111)
Creatinine, Ser: 1.2 mg/dL (ref 0.61–1.24)
GFR calc Af Amer: >60 mL/min (ref >60)
GFR calc non Af Amer: >60 mL/min (ref >60)
Glucose, Bld: 151 mg/dL — ABNORMAL HIGH (ref 70–99)
Potassium: 4.1 mmol/L (ref 3.5–5.1)
Sodium: 133 mmol/L — ABNORMAL LOW (ref 135–145)

## 2019-04-06 LAB — CBC
HCT: 45.2 % (ref 39.0–52.0)
Hemoglobin: 15.3 g/dL (ref 13.0–17.0)
MCH: 22.7 pg — ABNORMAL LOW (ref 26.0–34.0)
MCHC: 33.8 g/dL (ref 30.0–36.0)
MCV: 67.1 fL — ABNORMAL LOW (ref 80.0–100.0)
Platelets: 328 10*3/uL (ref 150–400)
RBC: 6.74 MIL/uL — ABNORMAL HIGH (ref 4.22–5.81)
RDW: 20.4 % — ABNORMAL HIGH (ref 11.5–15.5)
WBC: 7.3 10*3/uL (ref 4.0–10.5)
nRBC: 0 % (ref 0.0–0.2)

## 2019-04-06 LAB — URINALYSIS, ROUTINE W REFLEX MICROSCOPIC
Bilirubin Urine: NEGATIVE
Glucose, UA: NEGATIVE mg/dL
Ketones, ur: 20 mg/dL — AB
Leukocytes,Ua: NEGATIVE
Nitrite: NEGATIVE
Protein, ur: 30 mg/dL — AB
Specific Gravity, Urine: 1.017 (ref 1.005–1.030)
pH: 6 (ref 5.0–8.0)

## 2019-04-06 LAB — RAPID URINE DRUG SCREEN, HOSP PERFORMED
Amphetamines: NOT DETECTED
Barbiturates: NOT DETECTED
Benzodiazepines: NOT DETECTED
Cocaine: NOT DETECTED
Opiates: NOT DETECTED
Tetrahydrocannabinol: POSITIVE — AB

## 2019-04-06 LAB — LACTIC ACID, PLASMA
Lactic Acid, Venous: 3.3 mmol/L (ref 0.5–1.9)
Lactic Acid, Venous: 1.2 mmol/L (ref 0.5–1.9)

## 2019-04-06 LAB — HEPATIC FUNCTION PANEL
ALT: 102 U/L — ABNORMAL HIGH (ref 0–44)
AST: 203 U/L — ABNORMAL HIGH (ref 15–41)
Albumin: 4.2 g/dL (ref 3.5–5.0)
Alkaline Phosphatase: 103 U/L (ref 38–126)
Bilirubin, Direct: 0.2 mg/dL (ref 0.0–0.2)
Indirect Bilirubin: 1 mg/dL — ABNORMAL HIGH (ref 0.3–0.9)
Total Bilirubin: 1.2 mg/dL (ref 0.3–1.2)
Total Protein: 7.8 g/dL (ref 6.5–8.1)

## 2019-04-06 LAB — TROPONIN I (HIGH SENSITIVITY)
Troponin I (High Sensitivity): 6 ng/L (ref <18)
Troponin I (High Sensitivity): 6 ng/L (ref <18)

## 2019-04-06 LAB — POC SARS CORONAVIRUS 2 AG -  ED: SARS Coronavirus 2 Ag: NEGATIVE

## 2019-04-06 LAB — PHOSPHORUS: Phosphorus: 3 mg/dL (ref 2.5–4.6)

## 2019-04-06 LAB — MAGNESIUM: Magnesium: 2.1 mg/dL (ref 1.7–2.4)

## 2019-04-06 MED ORDER — ADULT MULTIVITAMIN W/MINERALS CH: 1.0000 | ORAL_TABLET | Freq: Every day | ORAL | Status: DC

## 2019-04-06 MED ORDER — THIAMINE HCL 100 MG/ML IJ SOLN: 100.0000 mg | Freq: Every day | INTRAMUSCULAR | Status: DC

## 2019-04-06 MED ORDER — CHLORDIAZEPOXIDE HCL 25 MG PO CAPS: ORAL_CAPSULE | ORAL | 0 refills | Status: DC

## 2019-04-06 MED ORDER — LORAZEPAM 1 MG PO TABS
1.0000 mg | ORAL_TABLET | ORAL | Status: DC | PRN
  Administered 2019-04-06: 11:00:00 1 mg via ORAL
  Filled 2019-04-06: qty 1

## 2019-04-06 MED ORDER — SODIUM CHLORIDE 0.9 % IV BOLUS
1000.0000 mL | Freq: Once | INTRAVENOUS | Status: AC
  Administered 2019-04-06: 1000 mL via INTRAVENOUS

## 2019-04-06 MED ORDER — FOLIC ACID 1 MG PO TABS
1.0000 mg | ORAL_TABLET | Freq: Every day | ORAL | Status: DC
  Administered 2019-04-06: 11:00:00 1 mg via ORAL
  Filled 2019-04-06: qty 1

## 2019-04-06 MED ORDER — SODIUM CHLORIDE 0.9% FLUSH
3.0000 mL | Freq: Once | INTRAVENOUS | Status: AC
  Administered 2019-04-06: 3 mL via INTRAVENOUS

## 2019-04-06 MED ORDER — LORAZEPAM 2 MG/ML IJ SOLN: 1.0000 mg | INTRAMUSCULAR | Status: DC | PRN

## 2019-04-06 MED ORDER — THIAMINE HCL 100 MG PO TABS
100.0000 mg | ORAL_TABLET | Freq: Every day | ORAL | Status: DC
  Administered 2019-04-06: 100 mg via ORAL
  Filled 2019-04-06: qty 1

## 2019-04-06 MED ORDER — ACETAMINOPHEN 325 MG PO TABS
650.0000 mg | ORAL_TABLET | Freq: Once | ORAL | Status: AC
  Administered 2019-04-06: 10:00:00 650 mg via ORAL
  Filled 2019-04-06: qty 2

## 2019-04-06 NOTE — ED Triage Notes (Signed)
Pt complains of dizziness, CP, sOB that has been going on for a day. It worsened this morning. Pt states he feels like he is going to pass out.

## 2019-04-06 NOTE — Discharge Instructions (Addendum)
As discussed, your symptoms are most likely related to alcohol withdrawal.  I have prescribed you medication to prevent alcohol withdrawal.  If you need you not to drink take medicine as prescribed.  Do not mix medication with alcohol.  If you decide to drink tonight do not start the medication.  You may start the medication tomorrow morning since you have already been given the medication here in the ED.  I have included outpatient resources.  Return to the ER for new or worsening symptoms.  All of your labs and work-up today were reassuring.  You are not having a heart attack.  Substance Abuse Treatment Programs  Intensive Outpatient Programs [facility]     [address]      [place], [place]                   [phone]       [facility] [address] [place] [address] [place], [place] [phone]  [facility]     (Inpatient and outpatient)     [address]           [phone]    [facility] [phone] (Suboxone and Methadone)  [address]      [place], [place] [address]      [phone]       [address] [place], [place] [phone]  [facility] (Outpatient/Inpatient, [employer])    (insurance only) [phone]             [facility] (Groups & Residential) [place], [place] [phone]     [facility]     [address]     [place], [place]      [phone]       [facility] (for caregivers and family) [phone]ure[address] [place], [place] [phone]      Residential Treatment Programs [facility]      [address], [place], [place] [address]  [phone]       [facility] [address] [place], [place] [address] [phone]  [facility] [place]        [phone]       [facility] [phone]  [facility] (Addiction [facility]             [address]                                         [place],  [place]                                                [phone] or [phone]                               [facility] of [place] [address] [place], [address] [phone]  [facility]    [address]      [place], [place]     [phone]       [facility] [address] [place], [place] [phone]  [facility]   [address] [place], [place] [address]  [phone]      Admissions: 8am-3pm M-F  [facility] (RTS) [address] [place], [place] [phone]  [facility]: Residential Program ([address]   [place], [place]      [phone] or [phone]     [facility]: [facility] [place], [place] (Walk in Hours over the weekend or by referral)  [facility] [address] [place], [place], [place] [address] [phone]  Crisis Mobile: [facility]:  [phone] (for crisis response 24 hours a day) [facility] Hotline:      [phone] Outpatient Psychiatry and Counseling  [facility]: Mobile Crisis Management 24 hours:  [phone]  [employer] of the [employer] sliding scale fee and walk in schedule: M-F 8am-12pm/1pm-3pm [address]  [place], [place] [address] [phone]  [facility] [address] [place], [place] [address] [phone]  [facility] (Formerly known as [facility] [employer])- new patient walk-in appointments available Monday - Friday 8am -3pm.          [address] [place], [place] [address] [phone] or crisis line- [phone]  [facility]/ Intensive Outpatient Therapy Program [address] [place], [place] [address] [phone]  [facility] Mental Health                  Crisis Services      [phone] [address]     [place], [place] [address]                 [facility]   [facility] [phone]. [address] [place], [place] [address]   [facility]          [address] [place]  [place], [place] [address]       [phone]  [facility] [address] [place], [place] [address] [phone]  [facility]    [address]    [place], [place] [address]     [phone]       Andee Poles, MD     [address]     [place] [address]     [phone]       [facility] [address] [place] [address]  [facility]     [address] [place], [place]      [phone]       [facility] Eulogio Ditch, MD [address] [place], [place] [address] [phone]  [facility]     [address]     [place], [place] [address]     [phone]       [facility] [address] [place], [place] [address] [phone] Resources for Temporary Residential Assistance/Crisis Centers  DAY CENTERS [facility]) M-F 8am-3pm   [address] [place], [place] [address]   [phone] Services include: laundry, barbering, support groups, case management, phone  & computer access, showers, AA/NA mtgs, mental health/substance abuse nurse, job skills class, disability information, VA assistance, spiritual classes, etc.   HOMELESS SHELTERS  [facility]     [facility]   [address], [place]     [phone]              [employer] (women and children)  [place], [place] [address] [phone] [email or web address] for application and process Application Required  [facility]   [address]    [place] [address]     [phone]                    [place], [place]  [address] [phone]([email or web address] application appt.) Application Required  [facility] (women only)    [address]     [place], [place] [address]     [phone]      Intake starts 6pm daily Need valid ID, SSC, & Police report [facility] [employer] [facility] [address] [place], [place] [phone] Application Required  [employer] (men only)     [place].      [place], [place]       [place] (Pregnant women only) [address] [place], [place]  [facility]      [place] [address].      [place], [place] [address]     [phone]             [facility] [address] [place], [place] 90 day commitment/SA/Application process  [facility](men only)     [address]     [place], [place]       Check-in at [facility] of [facility] [address] [place], [place] [address] [phone] Men/Women/Women and Children must be there by 7 pm  [place], [place]

## 2019-04-06 NOTE — ED Notes (Signed)
Pt discharge instructions and prescriptions reviewed with the patient. The patient verbalized understanding of instructions. Pt discharged. 

## 2019-04-06 NOTE — ED Provider Notes (Signed)
[facility] EMERGENCY DEPARTMENT Provider Note   CSN: [id number] Arrival date & time: 04/06/19  1740     History Chief Complaint  Patient presents with  . Chest Pain  . Dizziness    CORON ROSSANO is a 54 y.o. male with a past medical history significant for alcohol abuse, hypertension, and history of PE on Eliquis for the past 3 weeks who presents to the ED due to dizziness, chest pain, and shortness of breath x1 day.  Patient states this chest pain is right-sided, nonradiating, and constant in nature.  Patient describes chest pain as a tightness sensation worse with inspiration and exertion. Patient notes he typically drinks 2 40s a day, but his last alcoholic drink was 2 days ago. He denies being hospitalized for withdrawal and denies seizures from withdrawal.  Patient also admits to nausea, but denies abdominal pain, diarrhea, and vomiting.  Patient states his dizziness feels like the room is spinning around him both at rest and with exertion.  Patient denies fever and chills.  Patient denies sick contacts and Covid exposures. Patient denies history of early CAD. He admits to smoking roughly 7-8 cigarettes a day.       Past Medical History:  Diagnosis Date  . Chest pain   . ETOH abuse   . Hypertension   . Pulmonary embolism (HCC)   . Pulmonary nodules     Patient Active Problem List   Diagnosis Date Noted  . Essential hypertension 09/02/2018  . Syncope 09/01/2018  . Alcohol intoxication (HCC) 09/01/2018  . Pulmonary nodules 09/01/2018  . Chest pain 09/01/2018  . Smoker 06/25/2018  . Pulmonary embolism (HCC) 06/15/2018    Past Surgical History:  Procedure Laterality Date  . WRIST SURGERY Left        Family History  Problem Relation Age of Onset  . Diabetes Mother   . Hyperlipidemia Mother   . CAD Father     Social History   Tobacco Use  . Smoking status: Current Every Day Smoker    Packs/day: 0.25    Types: Cigarettes  . Smokeless  tobacco: Never Used  Substance Use Topics  . Alcohol use: Yes    Comment: 1-2 cans of beer -daily  . Drug use: Yes    Types: Marijuana    Home Medications Prior to Admission medications   Medication Sig Start Date End Date Taking? Authorizing Provider  acetaminophen (TYLENOL) 500 MG tablet Take 1,000 mg by mouth every 6 (six) hours as needed for mild pain.   Yes [provider]  apixaban (ELIQUIS) 5 MG TABS tablet Take 1 tablet (5 mg total) by mouth 2 (two) times daily. 12/21/18  Yes Hoy Register, MD  chlordiazePOXIDE (LIBRIUM) 25 MG capsule 50mg  PO TID x 1D, then 25-50mg  PO BID X 1D, then 25-50mg  PO QD X 1D 04/06/19   Codee Tutson C, PA-C  cyclobenzaprine (FLEXERIL) 10 MG tablet Take 1 tablet (10 mg total) by mouth at bedtime. Patient not taking: Reported on 02/22/2019 12/21/18   12/23/18, MD  diltiazem (CARDIZEM CD) 120 MG 24 hr capsule Take 1 capsule (120 mg total) by mouth daily. Patient not taking: Reported on 02/22/2019 09/18/18   09/20/18, MD  famotidine (PEPCID) 20 MG tablet Take 1 tablet (20 mg total) by mouth 2 (two) times daily. For stomach protection Patient not taking: Reported on 02/22/2019 09/18/18   Fulp, Cammie, MD  nicotine (NICODERM CQ - DOSED IN MG/24 HR) 7 mg/24hr patch Place 1 patch (  7 mg total) onto the skin daily. Patient not taking: Reported on 04/06/2019 07/21/18   Hoy Register, MD  traMADol (ULTRAM) 50 MG tablet Take 1 tablet (50 mg total) by mouth every 12 (twelve) hours as needed for moderate pain. Patient not taking: Reported on 02/22/2019 09/18/18   Cain Saupe, MD    Allergies    Penicillins  Review of Systems   Review of Systems  Constitutional: Negative for chills and fever.  Respiratory: Positive for shortness of breath. Negative for cough.   Cardiovascular: Positive for chest pain (right sided) and palpitations. Negative for leg swelling.  Gastrointestinal: Positive for nausea. Negative for abdominal pain, diarrhea and  vomiting.  Neurological: Positive for dizziness (room spinning sensation).  All other systems reviewed and are negative.   Physical Exam Updated Vital Signs BP (!) 142/91   Pulse 95   Temp 100.3 F (37.9 C) (Rectal)   Resp 17   Ht 6\' 2"  (1.88 m)   Wt 74.8 kg   SpO2 97%   BMI 21.18 kg/m   Physical Exam Vitals and nursing note reviewed.  Constitutional:      General: He is not in acute distress.    Appearance: He is ill-appearing.     Comments: Shivering in bed  HENT:     Head: Normocephalic.  Eyes:     Extraocular Movements: Extraocular movements intact.     Pupils: Pupils are equal, round, and reactive to light.  Neck:     Comments: No meningismus Cardiovascular:     Rate and Rhythm: Normal rate and regular rhythm.     Pulses: Normal pulses.     Heart sounds: Normal heart sounds. No murmur. No friction rub. No gallop.   Pulmonary:     Effort: Pulmonary effort is normal.     Breath sounds: Normal breath sounds.     Comments: Respirations equal and unlabored, patient able to speak in full sentences, lungs clear to auscultation bilaterally Abdominal:     General: Abdomen is flat. Bowel sounds are normal. There is no distension.     Palpations: Abdomen is soft.     Tenderness: There is no abdominal tenderness. There is no guarding.  Musculoskeletal:     Cervical back: Neck supple.     Comments: Able to move all 4 extremities without difficulty.  No lower extremity edema.  Negative Homans sign bilaterally.  Skin:    General: Skin is dry.  Neurological:     General: No focal deficit present.     Mental Status: He is alert.     Comments: Speech is clear, able to follow commands CN III-XII intact Normal strength in upper and lower extremities bilaterally including dorsiflexion and plantar flexion, strong and equal grip strength Sensation grossly intact throughout Moves extremities without ataxia, coordination intact No pronator drift      ED Results / Procedures /  Treatments   Labs (all labs ordered are listed, but only abnormal results are displayed) Labs Reviewed  BASIC METABOLIC PANEL - Abnormal; Notable for the following components:      Result Value   Sodium 133 (*)    Chloride 94 (*)    CO2 18 (*)    Glucose, Bld 151 (*)    Anion gap 21 (*)    All other components within normal limits  CBC - Abnormal; Notable for the following components:   RBC 6.74 (*)    MCV 67.1 (*)    MCH 22.7 (*)    RDW 20.4 (*)  All other components within normal limits  LACTIC ACID, PLASMA - Abnormal; Notable for the following components:   Lactic Acid, Venous 3.3 (*)    All other components within normal limits  URINALYSIS, ROUTINE W REFLEX MICROSCOPIC - Abnormal; Notable for the following components:   Hgb urine dipstick SMALL (*)    Ketones, ur 20 (*)    Protein, ur 30 (*)    Bacteria, UA RARE (*)    All other components within normal limits  HEPATIC FUNCTION PANEL - Abnormal; Notable for the following components:   AST 203 (*)    ALT 102 (*)    Indirect Bilirubin 1.0 (*)    All other components within normal limits  RAPID URINE DRUG SCREEN, HOSP PERFORMED - Abnormal; Notable for the following components:   Tetrahydrocannabinol POSITIVE (*)    All other components within normal limits  CULTURE, BLOOD (ROUTINE X 2)  CULTURE, BLOOD (ROUTINE X 2)  URINE CULTURE  LACTIC ACID, PLASMA  MAGNESIUM  PHOSPHORUS  POC SARS CORONAVIRUS 2 AG -  ED  TROPONIN I (HIGH SENSITIVITY)  TROPONIN I (HIGH SENSITIVITY)    EKG EKG Interpretation  Date/Time:  Tuesday April 06 2019 09:44:59 EST Ventricular Rate:  124 PR Interval:    QRS Duration: 73 QT Interval:  301 QTC Calculation: 433 R Axis:   71 Text Interpretation: Sinus tachycardia LAE, consider biatrial enlargement RSR' in V1 or V2, probably normal variant Confirmed by Davonna Belling [phone]) on 04/06/2019 9:51:01 AM   Radiology DG Chest 2 View  Result Date: 04/06/2019 CLINICAL DATA:  Chest pain  EXAM: CHEST - 2 VIEW COMPARISON:  February 22, 2019 FINDINGS: Lungs are clear. Heart size and pulmonary vascularity are normal. No adenopathy. No pneumothorax. No bone lesions. IMPRESSION: Lungs clear.  Cardiac silhouette normal.  No adenopathy. Electronically Signed   By: Lowella Grip III M.D.   On: 04/06/2019 10:22    Procedures Procedures (including critical care time)  Medications Ordered in ED Medications  LORazepam (ATIVAN) tablet 1-4 mg (1 mg Oral Given 04/06/19 1054)    Or  LORazepam (ATIVAN) injection 1-4 mg ( Intravenous See Alternative 04/06/19 1054)  thiamine tablet 100 mg (100 mg Oral Given 04/06/19 1054)    Or  thiamine (B-1) injection 100 mg ( Intravenous See Alternative 02/27/22 4010)  folic acid (FOLVITE) tablet 1 mg (1 mg Oral Given 04/06/19 1055)  multivitamin with minerals tablet 1 tablet (1 tablet Oral Not Given 04/06/19 1111)  sodium chloride flush (NS) 0.9 % injection 3 mL (3 mLs Intravenous Given 04/06/19 0959)  acetaminophen (TYLENOL) tablet 650 mg (650 mg Oral Given 04/06/19 1028)  sodium chloride 0.9 % bolus 1,000 mL (0 mLs Intravenous Stopped 04/06/19 1539)    ED Course  I have reviewed the triage vital signs and the nursing notes.  Pertinent labs & imaging results that were available during my care of the patient were reviewed by me and considered in my medical decision making (see chart for details).  Clinical Course as of Apr 05 1556  Tue Apr 06, 2019  1354 Reassessed patient at bedside who notes his symptoms have improved.  Vitals have improved with no more tachycardia.    [CA]    Clinical Course User Index [CA] Suzy Bouchard, PA-C   MDM Rules/Calculators/A&P                     Male presents to the ED due to dizziness, chest pain, and shortness of breath x1 day.  Patient has a history of PE on Eliquis 5 mg twice daily for the past 3 weeks.  Patient has a history of alcohol abuse and admits to drinking 2 40s daily.  Last alcoholic beverage was 2 days ago.   Upon arrival, patient tachycardic at 140 with a rectal temperature of 100.3 F. Patient shivering in bed. Normal neurological exam. Given patient has been compliant with his Eliquis doubt PE. Suspect possible alcohol withdrawal vs infectious etiology. CIWA protocol ordered placed. Chart reviewed. Patient was seen on 12/28 for complaint of SOB with a negative CTA.   EKG personally reviewed which demonstrates sinus tachycardia with no signs of ischemia.  Chest x-ray personally reviewed which is negative for signs of pneumonia, widened mediastinum, pneumothorax.  Reassuring with no leukocytosis.  BMP significant for hyponatremia 133, bicarb 18 with anion gap of 21. Lactic acid elevated at 3.3. Will give IVFs. AST and ALT both elevated (AST 203 & ALT 102). Initial troponin normal at 6. Will obtain delta troponin.  UA significant for ketonuria and proteinuria likely due to dehydration.  Second troponin normal at 6. Doubt ACS. Suspect patient's symptoms related to alcohol withdrawal given he had improvement in symptoms after Ativan.  After IV fluids, lactic acid improved to normal 1.2. No concern for sepsis at this time. Phosphorus and mag normal.  Rapid Covid test negative.  Discussed case with Dr. Rubin Payor who agrees with assessment and plan.  Throughout patient's ED stay, his heart rate normalized.  No signs of complicated alcohol withdrawal. Discussed plan with patient and he notes he does not plan to drink anymore. Will discharge patient with Librium taper to prevent withdrawal. Discussed case with Dr. Rubin Payor who agrees with assessment and plan. Outpatient resources given to patient. Instructed patient to return to the ER if he develops seizures, worsening CP, or worsening symptoms. Strict ED precautions discussed with patient. Patient states understanding and agrees to plan. Patient discharged home in no acute distress and stable vitals  Final Clinical Impression(s) / ED Diagnoses Final diagnoses:   Alcohol withdrawal syndrome without complication (HCC)  Nonspecific chest pain    Rx / DC Orders ED Discharge Orders         Ordered    chlordiazePOXIDE (LIBRIUM) 25 MG capsule     04/06/19 1546           Mannie Stabile, PA-C 04/06/19 1600    Benjiman Core, MD 04/07/19 [phone]

## 2019-04-07 LAB — URINE CULTURE

## 2019-04-11 LAB — CULTURE, BLOOD (ROUTINE X 2)
Culture: NO GROWTH
Culture: NO GROWTH
Special Requests: ADEQUATE

## 2019-04-22 ENCOUNTER — Ambulatory Visit: Payer: Self-pay | Attending: Family Medicine | Admitting: Family Medicine

## 2019-04-22 ENCOUNTER — Other Ambulatory Visit: Payer: Self-pay

## 2019-04-22 DIAGNOSIS — I10 Essential (primary) hypertension: Secondary | ICD-10-CM

## 2019-04-22 DIAGNOSIS — R55 Syncope and collapse: Secondary | ICD-10-CM

## 2019-04-22 DIAGNOSIS — G8929 Other chronic pain: Secondary | ICD-10-CM

## 2019-04-22 DIAGNOSIS — M25511 Pain in right shoulder: Secondary | ICD-10-CM

## 2019-04-22 DIAGNOSIS — R0789 Other chest pain: Secondary | ICD-10-CM

## 2019-04-22 DIAGNOSIS — I2699 Other pulmonary embolism without acute cor pulmonale: Secondary | ICD-10-CM

## 2019-04-22 MED ORDER — DICLOFENAC SODIUM 1 % EX GEL: 4.0000 g | Freq: Four times a day (QID) | CUTANEOUS | 1 refills | Status: DC

## 2019-04-22 MED ORDER — CYCLOBENZAPRINE HCL 10 MG PO TABS: 10.0000 mg | ORAL_TABLET | Freq: Every day | ORAL | 2 refills | Status: DC

## 2019-04-22 NOTE — Progress Notes (Signed)
Patient has been called and DOB has been verified. Patient has been screened and transferred to PCP to start phone visit.    Patient is concerned as why he is having the different episodes of dizziness and chest pains.  Patient is also having right shoulder pain.  Need refill on medication, patient is requesting pain medication.

## 2019-04-22 NOTE — Progress Notes (Signed)
Virtual Visit via Telephone Note  I connected with Travis Reed, on 04/22/2019 at 9:35 AM by telephone due to the COVID-19 pandemic and verified that I am speaking with the correct person using two identifiers.   Consent: I discussed the limitations, risks, security and privacy concerns of performing an evaluation and management service by telephone and the availability of in person appointments. I also discussed with the patient that there may be a patient responsible charge related to this service. The patient expressed understanding and agreed to proceed.   Location of Patient: Home  Location of Provider: Clinic   Persons participating in Telemedicine visit: Alesandro Stueve Farrington-CMA Dr. Margarita Rana     History of Present Illness: Travis Reed is a 54 year old male with a history of tobacco abuse, alcohol abuse diagnosed with pulmonary embolism (unprovoked PE) in 06/23/2018 and is currently on anticoagulation with Eliquis; plan was for 55-month anticoagulation from his discharge summary.   He was seen at the ED on 04/06/19 after he had presented with chest pains, was found to be tachycardic on EKG with a rate of 124, ruled out for ACS; had a slight fever of 100.3 and was shivering.  He was treated for alcohol withdrawal as symptoms resolved with the use of Ativan. He was discharged on Librium. At his brother's house he got lightheaded and passed out for about 30 seconds.He was attempting to stand but then sat back down and passed out afterwards. This was witnessed by his brother who denies seizure like activity. This has been occurring every 6 months over the last couple of years and now this has become more frequent. In 10/2018 he was evaluated for syncope and is status post event monitor which revealed PSVT/atrial tachycardia versus atrial flutter. By cardiology nurse practitioner in 10/2018 and no additional recommendations made.  He has R shoulder pain described as an ache  which wakes him up and at night he has to use a hot compress.  He had complained of this pain about 3 months ago at his last office visit. He drinks a beer every other day.  Past Medical History:  Diagnosis Date  . Chest pain   . ETOH abuse   . Hypertension   . Pulmonary embolism ([place])   . Pulmonary nodules    Allergies  Allergen Reactions  . Penicillins Other (See Comments)    Unknown childhood reaction: Did it involve swelling of the face/tongue/throat, SOB, or low BP? Unk Did it involve sudden or severe rash/hives, skin peeling, or any reaction on the inside of your mouth or nose? Unk Did you need to seek medical attention at a hospital or doctor's office? Unk When did it last happen? "I was a child" If all above answers are "NO", may proceed with cephalosporin use.     Current Outpatient Medications on File Prior to Visit  Medication Sig Dispense Refill  . acetaminophen (TYLENOL) 500 MG tablet Take 1,000 mg by mouth every 6 (six) hours as needed for mild pain.    Marland Kitchen apixaban (ELIQUIS) 5 MG TABS tablet Take 1 tablet (5 mg total) by mouth 2 (two) times daily. 60 tablet 2  . chlordiazePOXIDE (LIBRIUM) 25 MG capsule 50mg  PO TID x 1D, then 25-50mg  PO BID X 1D, then 25-50mg  PO QD X 1D (Patient not taking: Reported on 04/22/2019) 10 capsule 0  . cyclobenzaprine (FLEXERIL) 10 MG tablet Take 1 tablet (10 mg total) by mouth at bedtime. (Patient not taking: Reported on 02/22/2019) 30 tablet  2  . diltiazem (CARDIZEM CD) 120 MG 24 hr capsule Take 1 capsule (120 mg total) by mouth daily. (Patient not taking: Reported on 02/22/2019) 30 capsule 5  . famotidine (PEPCID) 20 MG tablet Take 1 tablet (20 mg total) by mouth 2 (two) times daily. For stomach protection (Patient not taking: Reported on 02/22/2019) 60 tablet 6  . nicotine (NICODERM CQ - DOSED IN MG/24 HR) 7 mg/24hr patch Place 1 patch (7 mg total) onto the skin daily. (Patient not taking: Reported on 04/06/2019) 28 patch 1  . traMADol (ULTRAM)  50 MG tablet Take 1 tablet (50 mg total) by mouth every 12 (twelve) hours as needed for moderate pain. (Patient not taking: Reported on 02/22/2019) 30 tablet 0   No current facility-administered medications on file prior to visit.    Observations/Objective: Awake, alert, oriented Not in acute distress On turning his head  Assessment and Plan: 1. Musculoskeletal chest pain He has had this pain for several months Seen by cardiology with no additional recommendation We will prescribe Flexeril - cyclobenzaprine (FLEXERIL) 10 MG tablet; Take 1 tablet (10 mg total) by mouth at bedtime.  Dispense: 30 tablet; Refill: 2  2. Chronic right shoulder pain Cannot exclude underlying osteoarthritis Unable to use oral NSAIDs due to chronic anticoagulation We will refer to PT and use topical NSAID - cyclobenzaprine (FLEXERIL) 10 MG tablet; Take 1 tablet (10 mg total) by mouth at bedtime.  Dispense: 30 tablet; Refill: 2 - diclofenac Sodium (VOLTAREN) 1 % GEL; Apply 4 g topically 4 (four) times daily.  Dispense: 100 g; Refill: 1 - Ambulatory referral to Physical Medicine Rehab  3. Syncope, unspecified syncope type Unknown etiology We will need to exclude seizures as well If unrevealing will refer back to cardiology - Ambulatory referral to Neurology  4.  Chronic PE Plan was initially for 6 months of anticoagulation however in the light of event monitor revealing atrial tachycardia versus atrial flutter he will continue on Eliquis until stated otherwise by cardiology.  Follow Up Instructions: 3 months.   I discussed the assessment and treatment plan with the patient. The patient was provided an opportunity to ask questions and all were answered. The patient agreed with the plan and demonstrated an understanding of the instructions.   The patient was advised to call back or seek an in-person evaluation if the symptoms worsen or if the condition fails to improve as anticipated.     I provided 17  minutes total of non-face-to-face time during this encounter including median intraservice time, reviewing previous notes, investigations, ordering medications, medical decision making, coordinating care and patient verbalized understanding at the end of the visit.     Hoy Register, MD, FAAFP. [facility] [place], [place] [phone]   04/22/2019, 9:35 AM

## 2019-04-23 ENCOUNTER — Encounter: Payer: Self-pay | Admitting: Family Medicine

## 2019-04-23 MED ORDER — DILTIAZEM HCL ER COATED BEADS 120 MG PO CP24: 120.0000 mg | ORAL_CAPSULE | Freq: Every day | ORAL | 5 refills | Status: DC

## 2019-04-23 MED ORDER — APIXABAN 5 MG PO TABS: 5.0000 mg | ORAL_TABLET | Freq: Two times a day (BID) | ORAL | 2 refills | Status: DC

## 2019-04-23 MED FILL — DILTIAZEM 24HR ER 120 MG CA: 120 | 30 days supply | Qty: 30 | Fill #0

## 2019-04-27 ENCOUNTER — Encounter: Payer: Self-pay | Admitting: Neurology

## 2019-06-09 ENCOUNTER — Ambulatory Visit: Payer: Self-pay

## 2019-06-15 ENCOUNTER — Ambulatory Visit: Payer: Self-pay | Admitting: Family Medicine

## 2019-06-23 ENCOUNTER — Ambulatory Visit: Payer: Self-pay | Admitting: Cardiovascular Disease

## 2019-07-29 ENCOUNTER — Ambulatory Visit: Payer: Self-pay | Admitting: Neurology

## 2019-10-16 ENCOUNTER — Emergency Department (HOSPITAL_COMMUNITY): Payer: Self-pay

## 2019-10-16 ENCOUNTER — Other Ambulatory Visit: Payer: Self-pay

## 2019-10-16 ENCOUNTER — Encounter (HOSPITAL_COMMUNITY): Payer: Self-pay | Admitting: Emergency Medicine

## 2019-10-16 ENCOUNTER — Emergency Department (HOSPITAL_COMMUNITY)
Admission: EM | Admit: 2019-10-16 | Discharge: 2019-10-17 | Disposition: A | Discharge status: Home / Self Care | Payer: Self-pay | Attending: Emergency Medicine | Admitting: Emergency Medicine

## 2019-10-16 DIAGNOSIS — M25531 Pain in right wrist: Secondary | ICD-10-CM | POA: Insufficient documentation

## 2019-10-16 DIAGNOSIS — F17210 Nicotine dependence, cigarettes, uncomplicated: Secondary | ICD-10-CM | POA: Insufficient documentation

## 2019-10-16 DIAGNOSIS — M25511 Pain in right shoulder: Secondary | ICD-10-CM | POA: Insufficient documentation

## 2019-10-16 DIAGNOSIS — M62838 Other muscle spasm: Secondary | ICD-10-CM | POA: Insufficient documentation

## 2019-10-16 DIAGNOSIS — M545 Low back pain: Secondary | ICD-10-CM | POA: Insufficient documentation

## 2019-10-16 DIAGNOSIS — I10 Essential (primary) hypertension: Secondary | ICD-10-CM | POA: Insufficient documentation

## 2019-10-16 DIAGNOSIS — W19XXXA Unspecified fall, initial encounter: Secondary | ICD-10-CM

## 2019-10-16 DIAGNOSIS — Z79899 Other long term (current) drug therapy: Secondary | ICD-10-CM | POA: Insufficient documentation

## 2019-10-16 NOTE — ED Triage Notes (Signed)
Pt states he slipped in liquid in grocery store and fell approx 30 min ago.  Denies LOC.  States he tried to catch himself with R side.  Reports R shoulder, R wrist, and R sided back pain.  Denies neck pain.

## 2019-10-17 MED ORDER — IBUPROFEN 800 MG PO TABS: 800.0000 mg | ORAL_TABLET | Freq: Three times a day (TID) | ORAL | 0 refills | Status: AC | PRN

## 2019-10-17 MED ORDER — KETOROLAC TROMETHAMINE 60 MG/2ML IM SOLN
60.0000 mg | Freq: Once | INTRAMUSCULAR | Status: AC
  Administered 2019-10-17: 60 mg via INTRAMUSCULAR
  Filled 2019-10-17: qty 2

## 2019-10-17 MED ORDER — CYCLOBENZAPRINE HCL 10 MG PO TABS: 10.0000 mg | ORAL_TABLET | Freq: Two times a day (BID) | ORAL | 0 refills | Status: DC | PRN

## 2019-10-17 MED ORDER — CYCLOBENZAPRINE HCL 10 MG PO TABS
10.0000 mg | ORAL_TABLET | Freq: Once | ORAL | Status: AC
  Administered 2019-10-17: 10 mg via ORAL
  Filled 2019-10-17: qty 1

## 2019-10-17 NOTE — ED Notes (Signed)
Patient Alert and oriented to baseline. Stable and ambulatory to baseline. Patient verbalized understanding of the discharge instructions.  Patient belongings were taken by the patient.   

## 2019-10-17 NOTE — ED Notes (Signed)
Pt ambulatory independently to treatment room. Gait steady.

## 2019-10-17 NOTE — ED Provider Notes (Signed)
[facility] EMERGENCY DEPARTMENT Provider Note   CSN: [id number] Arrival date & time: 10/16/19  1746     History Chief Complaint  Patient presents with  . Fall    Travis Reed. is a 54 y.o. male.  Patient slipped and fell yesterday.  Pain to the right shoulder right wrist, right lower back.  Denied his head or lose consciousness.  Not on a blood thinner.  Movement makes the pain worse.  The history is provided by the patient.  Fall This is a new problem. Episode onset: 18 hours. The problem occurs constantly. The problem has not changed since onset.Pertinent negatives include no headaches. The symptoms are aggravated by walking. Nothing relieves the symptoms. Travis Reed has tried nothing for the symptoms. The treatment provided no relief.       Past Medical History:  Diagnosis Date  . Chest pain   . ETOH abuse   . Hypertension   . Pulmonary embolism (HCC)   . Pulmonary nodules     Patient Active Problem List   Diagnosis Date Noted  . Essential hypertension 09/02/2018  . Syncope 09/01/2018  . Alcohol intoxication (HCC) 09/01/2018  . Pulmonary nodules 09/01/2018  . Chest pain 09/01/2018  . Smoker 06/25/2018  . Pulmonary embolism (HCC) 06/15/2018    Past Surgical History:  Procedure Laterality Date  . WRIST SURGERY Left        Family History  Problem Relation Age of Onset  . Diabetes Mother   . Hyperlipidemia Mother   . CAD Father     Social History   Tobacco Use  . Smoking status: Current Every Day Smoker    Packs/day: 0.25    Types: Cigarettes  . Smokeless tobacco: Never Used  Substance Use Topics  . Alcohol use: Yes    Comment: 1-2 cans of beer -daily  . Drug use: Yes    Types: Marijuana    Home Medications Prior to Admission medications   Medication Sig Start Date End Date Taking? Authorizing Provider  acetaminophen (TYLENOL) 500 MG tablet Take 1,000 mg by mouth every 6 (six) hours as needed for mild pain.    [provider]  apixaban (ELIQUIS) 5 MG TABS tablet Take 1 tablet (5 mg total) by mouth 2 (two) times daily. 04/23/19   Hoy Register, MD  chlordiazePOXIDE (LIBRIUM) 25 MG capsule 50mg  PO TID x 1D, then 25-50mg  PO BID X 1D, then 25-50mg  PO QD X 1D Patient not taking: Reported on 04/22/2019 04/06/19   06/04/19, PA-C  cyclobenzaprine (FLEXERIL) 10 MG tablet Take 1 tablet (10 mg total) by mouth 2 (two) times daily as needed for up to 20 doses for muscle spasms. 10/17/19   Salomon Ganser, DO  diclofenac Sodium (VOLTAREN) 1 % GEL Apply 4 g topically 4 (four) times daily. 04/22/19   04/24/19, MD  diltiazem (CARDIZEM CD) 120 MG 24 hr capsule Take 1 capsule (120 mg total) by mouth daily. 04/23/19   04/25/19, MD  famotidine (PEPCID) 20 MG tablet Take 1 tablet (20 mg total) by mouth 2 (two) times daily. For stomach protection Patient not taking: Reported on 02/22/2019 09/18/18   Fulp, 09/20/18, MD  ibuprofen (ADVIL) 800 MG tablet Take 1 tablet (800 mg total) by mouth every 8 (eight) hours as needed for moderate pain. 10/17/19 11/16/19  Livianna Petraglia, DO  nicotine (NICODERM CQ - DOSED IN MG/24 HR) 7 mg/24hr patch Place 1 patch (7 mg total) onto the skin daily. Patient not  taking: Reported on 04/06/2019 07/21/18   Hoy Register, MD  traMADol (ULTRAM) 50 MG tablet Take 1 tablet (50 mg total) by mouth every 12 (twelve) hours as needed for moderate pain. Patient not taking: Reported on 02/22/2019 09/18/18   Cain Saupe, MD    Allergies    Penicillins  Review of Systems   Review of Systems  Constitutional: Negative for fatigue and fever.  Musculoskeletal: Positive for arthralgias and back pain. Negative for gait problem, joint swelling, myalgias, neck pain and neck stiffness.  Skin: Negative for color change, pallor, rash and wound.  Neurological: Negative for dizziness, weakness, numbness and headaches.    Physical Exam Updated Vital Signs  ED Triage Vitals  Enc Vitals Group     BP  10/16/19 1832 113/81     Pulse Rate 10/16/19 1832 75     Resp 10/16/19 1832 16     Temp 10/16/19 1832 98.3 F (36.8 C)     Temp Source 10/16/19 1832 Oral     SpO2 10/16/19 1832 100 %     Weight 10/16/19 1832 165 lb (74.8 kg)     Height 10/16/19 1832 6\' 2"  (1.88 m)     Head Circumference --      Peak Flow --      Pain Score 10/16/19 1836 8     Pain Loc --      Pain Edu? --      Excl. in GC? --     Physical Exam Vitals and nursing note reviewed.  Constitutional:      Appearance: Travis Reed is well-developed. Travis Reed is not ill-appearing.  HENT:     Head: Normocephalic and atraumatic.  Eyes:     Extraocular Movements: Extraocular movements intact.     Conjunctiva/sclera: Conjunctivae normal.     Pupils: Pupils are equal, round, and reactive to light.  Cardiovascular:     Rate and Rhythm: Normal rate and regular rhythm.     Heart sounds: No murmur heard.   Pulmonary:     Effort: Pulmonary effort is normal. No respiratory distress.     Breath sounds: Normal breath sounds.  Abdominal:     Palpations: Abdomen is soft.     Tenderness: There is no abdominal tenderness.  Musculoskeletal:        General: Tenderness present. No swelling. Normal range of motion.     Cervical back: Normal range of motion and neck supple. No tenderness.     Comments: Tenderness to paraspinal muscles of the right lumbar spine, right shoulder, right wrist however no obvious deformities on exam, no midline spinal tenderness  Skin:    General: Skin is warm and dry.     Capillary Refill: Capillary refill takes less than 2 seconds.  Neurological:     General: No focal deficit present.     Mental Status: Travis Reed is alert and oriented to person, place, and time.     Cranial Nerves: No cranial nerve deficit.     Sensory: No sensory deficit.     Motor: No weakness.     Coordination: Coordination normal.     Gait: Gait normal.     ED Results / Procedures / Treatments   Labs (all labs ordered are listed, but only  abnormal results are displayed) Labs Reviewed - No data to display  EKG None  Radiology DG Shoulder Right  Result Date: 10/16/2019 CLINICAL DATA:  Fall, EXAM: RIGHT SHOULDER - 2+ VIEW COMPARISON:  None. FINDINGS: There is no evidence of fracture or  dislocation. There is no evidence of arthropathy or other focal bone abnormality. Soft tissues are unremarkable. IMPRESSION: Negative. Electronically Signed   By: Helyn Numbers MD   On: 10/16/2019 19:21   DG Wrist Complete Right  Result Date: 10/16/2019 CLINICAL DATA:  Fall, right wrist pain EXAM: RIGHT WRIST - COMPLETE 3+ VIEW COMPARISON:  None. FINDINGS: There is no evidence of fracture or dislocation. There is no evidence of arthropathy or other focal bone abnormality. Soft tissues are unremarkable. IMPRESSION: Negative. Electronically Signed   By: Helyn Numbers MD   On: 10/16/2019 19:22    Procedures Procedures (including critical care time)  Medications Ordered in ED Medications  ketorolac (TORADOL) injection 60 mg (has no administration in time range)  cyclobenzaprine (FLEXERIL) tablet 10 mg (has no administration in time range)    ED Course  I have reviewed the triage vital signs and the nursing notes.  Pertinent labs & imaging results that were available during my care of the patient were reviewed by me and considered in my medical decision making (see chart for details).    MDM Rules/Calculators/A&P                          Travis Lal. is a 54 year old male with history of PE no longer on blood thinners who presents to the ED after a fall yesterday.  Normal vitals.  No fever.  Having right wrist and shoulder pain but negative x-rays.  Mostly complaining of right lower back pain consistent with a muscle spasm.  No midline spinal tenderness.  Normal neurological exam.  Normal strength and sensation and gait.  Suspect muscle spasm.  Given IM Toradol and Flexeril in the ED.  Will prescribe Motrin and Flexeril as well.   Discharged in good condition.  Given return precautions.  This chart was dictated using voice recognition software.  Despite best efforts to proofread,  errors can occur which can change the documentation meaning.    Final Clinical Impression(s) / ED Diagnoses Final diagnoses:  Fall, initial encounter  Muscle spasm    Rx / DC Orders ED Discharge Orders         Ordered    ibuprofen (ADVIL) 800 MG tablet  Every 8 hours PRN        10/17/19 0859    cyclobenzaprine (FLEXERIL) 10 MG tablet  2 times daily PRN        10/17/19 0859           Virgina Norfolk, DO 10/17/19 [phone]

## 2019-10-27 ENCOUNTER — Ambulatory Visit: Payer: Self-pay | Admitting: Neurology

## 2020-07-19 ENCOUNTER — Emergency Department (HOSPITAL_COMMUNITY)
Admission: EM | Admit: 2020-07-19 | Discharge: 2020-07-20 | Disposition: A | Discharge status: Home / Self Care | Payer: Medicaid Other | Attending: Emergency Medicine | Admitting: Emergency Medicine

## 2020-07-19 DIAGNOSIS — F1490 Cocaine use, unspecified, uncomplicated: Secondary | ICD-10-CM | POA: Diagnosis not present

## 2020-07-19 DIAGNOSIS — Y906 Blood alcohol level of 120-199 mg/100 ml: Secondary | ICD-10-CM | POA: Insufficient documentation

## 2020-07-19 DIAGNOSIS — R0781 Pleurodynia: Secondary | ICD-10-CM | POA: Insufficient documentation

## 2020-07-19 DIAGNOSIS — F1410 Cocaine abuse, uncomplicated: Secondary | ICD-10-CM

## 2020-07-19 DIAGNOSIS — I10 Essential (primary) hypertension: Secondary | ICD-10-CM | POA: Diagnosis not present

## 2020-07-19 DIAGNOSIS — F17210 Nicotine dependence, cigarettes, uncomplicated: Secondary | ICD-10-CM | POA: Insufficient documentation

## 2020-07-19 DIAGNOSIS — E889 Metabolic disorder, unspecified: Secondary | ICD-10-CM

## 2020-07-19 DIAGNOSIS — R55 Syncope and collapse: Secondary | ICD-10-CM | POA: Insufficient documentation

## 2020-07-19 DIAGNOSIS — R000 Tachycardia, unspecified: Secondary | ICD-10-CM | POA: Diagnosis not present

## 2020-07-19 DIAGNOSIS — F10988 Alcohol use, unspecified with other alcohol-induced disorder: Secondary | ICD-10-CM | POA: Diagnosis not present

## 2020-07-20 ENCOUNTER — Encounter (HOSPITAL_COMMUNITY): Payer: Self-pay | Admitting: Emergency Medicine

## 2020-07-20 ENCOUNTER — Emergency Department (HOSPITAL_COMMUNITY): Payer: Medicaid Other

## 2020-07-20 LAB — BASIC METABOLIC PANEL
Anion gap: 18 — ABNORMAL HIGH (ref 5–15)
BUN: <5 mg/dL — ABNORMAL LOW (ref 6–20)
CO2: 20 mmol/L — ABNORMAL LOW (ref 22–32)
Calcium: 9.1 mg/dL (ref 8.9–10.3)
Chloride: 91 mmol/L — ABNORMAL LOW (ref 98–111)
Creatinine, Ser: 0.95 mg/dL (ref 0.61–1.24)
GFR, Estimated: >60 mL/min (ref >60)
Glucose, Bld: 91 mg/dL (ref 70–99)
Potassium: 3.5 mmol/L (ref 3.5–5.1)
Sodium: 129 mmol/L — ABNORMAL LOW (ref 135–145)

## 2020-07-20 LAB — RAPID URINE DRUG SCREEN, HOSP PERFORMED
Amphetamines: NOT DETECTED
Barbiturates: NOT DETECTED
Benzodiazepines: NOT DETECTED
Cocaine: POSITIVE — AB
Opiates: NOT DETECTED
Tetrahydrocannabinol: POSITIVE — AB

## 2020-07-20 LAB — CBC
HCT: 40.1 % (ref 39.0–52.0)
Hemoglobin: 13.6 g/dL (ref 13.0–17.0)
MCH: 23 pg — ABNORMAL LOW (ref 26.0–34.0)
MCHC: 33.9 g/dL (ref 30.0–36.0)
MCV: 67.7 fL — ABNORMAL LOW (ref 80.0–100.0)
Platelets: 210 10*3/uL (ref 150–400)
RBC: 5.92 MIL/uL — ABNORMAL HIGH (ref 4.22–5.81)
RDW: 17.8 % — ABNORMAL HIGH (ref 11.5–15.5)
WBC: 4.7 10*3/uL (ref 4.0–10.5)
nRBC: 0 % (ref 0.0–0.2)

## 2020-07-20 LAB — CK: Total CK: 267 U/L (ref 49–397)

## 2020-07-20 LAB — HEPATIC FUNCTION PANEL
ALT: 111 U/L — ABNORMAL HIGH (ref 0–44)
AST: 232 U/L — ABNORMAL HIGH (ref 15–41)
Albumin: 4.3 g/dL (ref 3.5–5.0)
Alkaline Phosphatase: 81 U/L (ref 38–126)
Bilirubin, Direct: 0.2 mg/dL (ref 0.0–0.2)
Indirect Bilirubin: 0.7 mg/dL (ref 0.3–0.9)
Total Bilirubin: 0.9 mg/dL (ref 0.3–1.2)
Total Protein: 7.7 g/dL (ref 6.5–8.1)

## 2020-07-20 LAB — PROTIME-INR
INR: 0.9 (ref 0.8–1.2)
Prothrombin Time: 11.7 seconds (ref 11.4–15.2)

## 2020-07-20 LAB — URINALYSIS, ROUTINE W REFLEX MICROSCOPIC
Bilirubin Urine: NEGATIVE
Glucose, UA: NEGATIVE mg/dL
Hgb urine dipstick: NEGATIVE
Ketones, ur: NEGATIVE mg/dL
Leukocytes,Ua: NEGATIVE
Nitrite: NEGATIVE
Protein, ur: NEGATIVE mg/dL
Specific Gravity, Urine: 1.001 — ABNORMAL LOW (ref 1.005–1.030)
pH: 6 (ref 5.0–8.0)

## 2020-07-20 LAB — D-DIMER, QUANTITATIVE: D-Dimer, Quant: 0.6 ug/mL-FEU — ABNORMAL HIGH (ref 0.00–0.50)

## 2020-07-20 LAB — ETHANOL: Alcohol, Ethyl (B): 163 mg/dL — ABNORMAL HIGH (ref <10)

## 2020-07-20 LAB — TROPONIN I (HIGH SENSITIVITY)
Troponin I (High Sensitivity): 9 ng/L (ref <18)
Troponin I (High Sensitivity): 9 ng/L (ref <18)

## 2020-07-20 MED ORDER — SODIUM CHLORIDE 0.9 % IV BOLUS
1000.0000 mL | Freq: Once | INTRAVENOUS | Status: AC
  Administered 2020-07-20: 1000 mL via INTRAVENOUS

## 2020-07-20 MED ORDER — TECHNETIUM TO 99M ALBUMIN AGGREGATED
4.1000 | Freq: Once | INTRAVENOUS | Status: AC | PRN
  Administered 2020-07-20: 4.1 via INTRAVENOUS

## 2020-07-20 NOTE — ED Notes (Signed)
Pt is resting at this time

## 2020-07-20 NOTE — ED Provider Notes (Signed)
Patient is a 55 year old male whose care was transferred to me at shift change from [employer].  Her HPI is below:  Travis Reed. is a 55 y.o. male with a history of alcohol use disorder, HTN, PE not on anticoagulation, pulmonary nodules, and recurrent syncope who presents the emergency department by EMS with a chief complaint of syncope.  Per EMS, the patient was found lying on the ground outside a motel complaining of chest pain.  History was limited as the patient was not speaking to EMS or allowing them to assess or cooperate with them other than getting a set of vitals.  EMS suspected that EtOH was on board.  Vital signs in route with heart rate of 100, CBG 149, 99% on room air, and blood pressure was 138/78.  The patient states that he was initially very drowsy when EMS arrived, but his mentation has been improving with time.  He reports that he is now feeling back to his baseline.  He reports a history of previous syncopal episodes for which she has previously been seen by cardiology.   Per chart review "Cardiac monitor on 10/28/2018 revealed episodes of PSVT/atrial tachycardia versus atrial flutter. Maximum heart rate was 190 bpm, atrial flutter with 2-1 conduction was noted. He is here for follow-up to discuss his results and adjust medications."  He endorses diffuse muscle cramping in his bilateral legs that is most predominant in his bilateral thighs that has been progressively worsening over the last few days.  He also endorses left-sided pleuritic chest pain for the last few weeks.  Episodes have been coming and going, but have been increasing in intensity.  He had a difficult time sleeping yesterday due to the intensity of the chest pain.  At times, he will have associated shortness of breath.  He states that the pain feels similar to when he was diagnosed with his PE in 2020.    He also reports that he has been having right flank pain for the last few weeks.  He  characterizes the pain as cramping and intermittently sharp.  No known trauma or injury.  He states that the pain is improved when he is not laying flat on his back.  No other known aggravating or alleviating factors.  He denies dysuria, hematuria, urinary frequency or hesitancy, nausea, vomiting, diarrhea, decreased urinary output, penile or testicular pain or swelling, neck pain, back pain.  Reports that previous PE was unprovoked.  He was on Eliquis and states that he was taking the medication until he started having bleeding from his gums.  He discussed it with his doctor and the medication was stopped.  He is unsure how many months ago the medication was stopped.    No recent long travel or immobilization.  The patient does report that he drinks 6 beers daily.  Last drink was just prior to arrival.  He is a current, everyday tobacco user and also endorses smoking marijuana.  Denies any other illicit or recreational substance use.  He specifically denies cocaine use.  The history is provided by the patient and medical records. No language interpreter was used.  Physical Exam  BP 126/78   Pulse 78   Temp 97.9 F (36.6 C) (Oral)   Resp 12   SpO2 98%   Physical Exam Physical Exam Vitals and nursing note reviewed.  Constitutional:      General: He is not in acute distress.    Appearance: He is well-developed. He is not ill-appearing,  toxic-appearing or diaphoretic.     Comments: No acute distress.  HENT:     Head: Normocephalic.  Eyes:     Conjunctiva/sclera: Conjunctivae normal.     Comments: Bilateral eyes are injected.  Pupils are equal round and reactive.  Cardiovascular:     Rate and Rhythm: Regular rhythm. Tachycardia present.     Pulses: Normal pulses.     Heart sounds: Normal heart sounds. No murmur heard. No gallop.      Comments: Borderline tachycardia Pulmonary:     Effort: Pulmonary effort is normal. No respiratory distress.     Breath sounds: No stridor. No  wheezing, rhonchi or rales.     Comments: Reproducible tenderness to palpation to the left chest wall.  No crepitus or step-offs. Chest:     Chest wall: Tenderness present.  Abdominal:     General: There is no distension.     Palpations: Abdomen is soft. There is no mass.     Tenderness: There is abdominal tenderness. There is right CVA tenderness. There is no left CVA tenderness, guarding or rebound.     Hernia: No hernia is present.     Comments: Minimal tenderness palpation to the right CVA.  No left CVA tenderness.  Abdomen is soft and nondistended.  Normoactive bowel sounds.  No peritoneal signs.  Musculoskeletal:        General: No swelling or tenderness.     Cervical back: Neck supple.     Right lower leg: No edema.     Left lower leg: No edema.  Skin:    General: Skin is warm and dry.     Capillary Refill: Capillary refill takes less than 2 seconds.     Findings: No bruising or erythema.  Neurological:     Mental Status: He is alert.     Comments: 4/5 strength against resistance of the right upper extremity as compared to 5 out of 5 strength against resistance of the left upper extremity.  Good strength against resistance of the bilateral lower extremities.  Sensations intact and equal throughout.  Cranial nerves II through XII are grossly intact.  Finger-nose is intact bilaterally.  Psychiatric:        Behavior: Behavior normal.  ED Course/Procedures     Procedures  MDM  Patient is a 55 year old male whose care was transferred to me at shift change from [employer].  Please see her note for additional details regarding the patient.  At shift change patient was pending VQ scan for rule out of possible pulmonary embolism.  Patient has a history of PEs and is not currently anticoagulated.  VQ scan was obtained and is showing normal lung perfusion.  Patient had an initial troponin of 9 with a repeat of 9.  Initial ECG was showing sinus tachycardia.  Repeat ECG showing normal  sinus rhythm. Doubt ACS.  Patient's UDS positive for cocaine as well as THC.  Patient also had an elevated ethanol at 163.  We discussed his drug and alcohol use in length.  Patient given resources for local treatment centers.  Patient ambulated by the nursing staff without difficulties.  Feel the patient is stable for discharge at this time and he is agreeable.  Recommended follow-up with his cardiologist.  Discussed return precautions.  His questions were answered and he was amicable at the time of discharge.   Placido Sou, PA-C 07/20/20 1129    Benjiman Core, MD 07/20/20 [phone]

## 2020-07-20 NOTE — ED Triage Notes (Signed)
Per EMS. Pt was found lying on the ground outside a motel c/o CP.  He would not speak to ems, allow them to assess him or cooperate in any way other than get vitals.  Suspected ETOH  138/78 99% RA 100 HR regular 149 CBG  Pt reported to RN that he is not complaint w/ medications.

## 2020-07-20 NOTE — ED Notes (Addendum)
Pt wife was updated by patient.Pt wife was also updated by this RN.   Pts wife would like a callback with an update if possible, Oletha Cruel [phone]

## 2020-07-20 NOTE — ED Provider Notes (Signed)
[facility] EMERGENCY DEPARTMENT Provider Note   CSN: [id number] Arrival date & time: 07/19/20  2340     History Chief Complaint  Patient presents with  . Chest Pain    Kinneth Fujiwara. is a 55 y.o. male with a history of alcohol use disorder, HTN, PE not on anticoagulation, pulmonary nodules, and recurrent syncope who presents the emergency department by EMS with a chief complaint of syncope.  Per EMS, the patient was found lying on the ground outside a motel complaining of chest pain.  History was limited as the patient was not speaking to EMS or allowing them to assess or cooperate with them other than getting a set of vitals.  EMS suspected that EtOH was on board.  Vital signs in route with heart rate of 100, CBG 149, 99% on room air, and blood pressure was 138/78.  The patient states that he was initially very drowsy when EMS arrived, but his mentation has been improving with time.  He reports that he is now feeling back to his baseline.  He reports a history of previous syncopal episodes for which she has previously been seen by cardiology.   Per chart review "Cardiac monitor on 10/28/2018 revealed episodes of PSVT/atrial tachycardia versus atrial flutter.  Maximum heart rate was 190 bpm, atrial flutter with 2-1 conduction was noted.  He is here for follow-up to discuss his results and adjust medications."  He endorses diffuse muscle cramping in his bilateral legs that is most predominant in his bilateral thighs that has been progressively worsening over the last few days.  He also endorses left-sided pleuritic chest pain for the last few weeks.  Episodes have been coming and going, but have been increasing in intensity.  He had a difficult time sleeping yesterday due to the intensity of the chest pain.  At times, he will have associated shortness of breath.  He states that the pain feels similar to when he was diagnosed with his PE in 2020.    He also reports that  he has been having right flank pain for the last few weeks.  He characterizes the pain as cramping and intermittently sharp.  No known trauma or injury.  He states that the pain is improved when he is not laying flat on his back.  No other known aggravating or alleviating factors.  He denies dysuria, hematuria, urinary frequency or hesitancy, nausea, vomiting, diarrhea, decreased urinary output, penile or testicular pain or swelling, neck pain, back pain.  Reports that previous PE was unprovoked.  He was on Eliquis and states that he was taking the medication until he started having bleeding from his gums.  He discussed it with his doctor and the medication was stopped.  He is unsure how many months ago the medication was stopped.    No recent long travel or immobilization.  The patient does report that he drinks 6 beers daily.  Last drink was just prior to arrival.  He is a current, everyday tobacco user and also endorses smoking marijuana.  Denies any other illicit or recreational substance use.  He specifically denies cocaine use.  The history is provided by the patient and medical records. No language interpreter was used.       Past Medical History:  Diagnosis Date  . Chest pain   . ETOH abuse   . Hypertension   . Pulmonary embolism (HCC)   . Pulmonary nodules     Patient Active Problem List  Diagnosis Date Noted  . Essential hypertension 09/02/2018  . Syncope 09/01/2018  . Alcohol intoxication (HCC) 09/01/2018  . Pulmonary nodules 09/01/2018  . Chest pain 09/01/2018  . Smoker 06/25/2018  . Pulmonary embolism (HCC) 06/15/2018    Past Surgical History:  Procedure Laterality Date  . WRIST SURGERY Left        Family History  Problem Relation Age of Onset  . Diabetes Mother   . Hyperlipidemia Mother   . CAD Father     Social History   Tobacco Use  . Smoking status: Current Every Day Smoker    Packs/day: 0.25    Types: Cigarettes  . Smokeless tobacco: Never Used   Substance Use Topics  . Alcohol use: Yes    Comment: 1-2 cans of beer -daily  . Drug use: Yes    Types: Marijuana    Home Medications Prior to Admission medications   Medication Sig Start Date End Date Taking? Authorizing Provider  apixaban (ELIQUIS) 5 MG TABS tablet Take 1 tablet (5 mg total) by mouth 2 (two) times daily. Patient not taking: Reported on 07/20/2020 04/23/19   Hoy Register, MD  chlordiazePOXIDE (LIBRIUM) 25 MG capsule 50mg  PO TID x 1D, then 25-50mg  PO BID X 1D, then 25-50mg  PO QD X 1D Patient not taking: No sig reported 04/06/19   06/04/19, PA-C  cyclobenzaprine (FLEXERIL) 10 MG tablet Take 1 tablet (10 mg total) by mouth 2 (two) times daily as needed for up to 20 doses for muscle spasms. Patient not taking: Reported on 07/20/2020 10/17/19   10/19/19, DO  diclofenac Sodium (VOLTAREN) 1 % GEL Apply 4 g topically 4 (four) times daily. Patient not taking: Reported on 07/20/2020 04/22/19   04/24/19, MD  diltiazem (CARDIZEM CD) 120 MG 24 hr capsule Take 1 capsule (120 mg total) by mouth daily. Patient not taking: Reported on 07/20/2020 04/23/19   04/25/19, MD  famotidine (PEPCID) 20 MG tablet Take 1 tablet (20 mg total) by mouth 2 (two) times daily. For stomach protection Patient not taking: No sig reported 09/18/18   Fulp, Cammie, MD  nicotine (NICODERM CQ - DOSED IN MG/24 HR) 7 mg/24hr patch Place 1 patch (7 mg total) onto the skin daily. Patient not taking: No sig reported 07/21/18   07/23/18, MD  traMADol (ULTRAM) 50 MG tablet Take 1 tablet (50 mg total) by mouth every 12 (twelve) hours as needed for moderate pain. Patient not taking: No sig reported 09/18/18   09/20/18, MD    Allergies    Penicillins  Review of Systems   Review of Systems  Constitutional: Negative for appetite change and fever.  HENT: Negative for congestion and sore throat.   Eyes: Negative for visual disturbance.  Respiratory: Negative for cough, shortness of  breath and wheezing.   Cardiovascular: Positive for chest pain. Negative for palpitations.  Gastrointestinal: Negative for abdominal pain, blood in stool, diarrhea, nausea and vomiting.  Genitourinary: Positive for flank pain. Negative for dysuria, hematuria and urgency.  Musculoskeletal: Negative for back pain, myalgias, neck pain and neck stiffness.  Skin: Negative for rash and wound.  Allergic/Immunologic: Negative for immunocompromised state.  Neurological: Positive for syncope. Negative for dizziness, seizures, weakness, numbness and headaches.  Psychiatric/Behavioral: Negative for confusion.    Physical Exam Updated Vital Signs BP 124/87   Pulse 75   Temp 98 F (36.7 C) (Oral)   Resp 12   SpO2 98%   Physical Exam Vitals and nursing note reviewed.  Constitutional:      General: He is not in acute distress.    Appearance: He is well-developed. He is not ill-appearing, toxic-appearing or diaphoretic.     Comments: No acute distress.  HENT:     Head: Normocephalic.  Eyes:     Conjunctiva/sclera: Conjunctivae normal.     Comments: Bilateral eyes are injected.  Pupils are equal round and reactive.  Cardiovascular:     Rate and Rhythm: Regular rhythm. Tachycardia present.     Pulses: Normal pulses.     Heart sounds: Normal heart sounds. No murmur heard. No gallop.      Comments: Borderline tachycardia Pulmonary:     Effort: Pulmonary effort is normal. No respiratory distress.     Breath sounds: No stridor. No wheezing, rhonchi or rales.     Comments: Reproducible tenderness to palpation to the left chest wall.  No crepitus or step-offs. Chest:     Chest wall: Tenderness present.  Abdominal:     General: There is no distension.     Palpations: Abdomen is soft. There is no mass.     Tenderness: There is abdominal tenderness. There is right CVA tenderness. There is no left CVA tenderness, guarding or rebound.     Hernia: No hernia is present.     Comments: Minimal  tenderness palpation to the right CVA.  No left CVA tenderness.  Abdomen is soft and nondistended.  Normoactive bowel sounds.  No peritoneal signs.  Musculoskeletal:        General: No swelling or tenderness.     Cervical back: Neck supple.     Right lower leg: No edema.     Left lower leg: No edema.  Skin:    General: Skin is warm and dry.     Capillary Refill: Capillary refill takes less than 2 seconds.     Findings: No bruising or erythema.  Neurological:     Mental Status: He is alert.     Comments: 4/5 strength against resistance of the right upper extremity as compared to 5 out of 5 strength against resistance of the left upper extremity.  Good strength against resistance of the bilateral lower extremities.  Sensations intact and equal throughout.  Cranial nerves II through XII are grossly intact.  Finger-nose is intact bilaterally.  Psychiatric:        Behavior: Behavior normal.     ED Results / Procedures / Treatments   Labs (all labs ordered are listed, but only abnormal results are displayed) Labs Reviewed  BASIC METABOLIC PANEL - Abnormal; Notable for the following components:      Result Value   Sodium 129 (*)    Chloride 91 (*)    CO2 20 (*)    BUN <5 (*)    Anion gap 18 (*)    All other components within normal limits  CBC - Abnormal; Notable for the following components:   RBC 5.92 (*)    MCV 67.7 (*)    MCH 23.0 (*)    RDW 17.8 (*)    All other components within normal limits  HEPATIC FUNCTION PANEL - Abnormal; Notable for the following components:   AST 232 (*)    ALT 111 (*)    All other components within normal limits  D-DIMER, QUANTITATIVE - Abnormal; Notable for the following components:   D-Dimer, Quant 0.60 (*)    All other components within normal limits  RAPID URINE DRUG SCREEN, HOSP PERFORMED - Abnormal; Notable for the following components:  Cocaine POSITIVE (*)    Tetrahydrocannabinol POSITIVE (*)    All other components within normal limits   URINALYSIS, ROUTINE W REFLEX MICROSCOPIC - Abnormal; Notable for the following components:   Color, Urine STRAW (*)    Specific Gravity, Urine 1.001 (*)    All other components within normal limits  ETHANOL - Abnormal; Notable for the following components:   Alcohol, Ethyl (B) 163 (*)    All other components within normal limits  PROTIME-INR  CK  TROPONIN I (HIGH SENSITIVITY)  TROPONIN I (HIGH SENSITIVITY)    EKG EKG Interpretation  Date/Time:  Wednesday Jul 19 2020 23:51:44 EDT Ventricular Rate:  104 PR Interval:  144 QRS Duration: 76 QT Interval:  344 QTC Calculation: 452 R Axis:   72 Text Interpretation: Sinus tachycardia Otherwise normal ECG Confirmed by Marily Memos [phone]) on 07/20/2020 4:10:03 AM   Radiology DG Chest 2 View  Result Date: 07/20/2020 CLINICAL DATA:  Chest pain EXAM: CHEST - 2 VIEW COMPARISON:  None. FINDINGS: The heart size and mediastinal contours are within normal limits. Both lungs are clear. The visualized skeletal structures are unremarkable. IMPRESSION: No active cardiopulmonary disease. Electronically Signed   By: Helyn Numbers MD   On: 07/20/2020 03:52   CT Head Wo Contrast  Result Date: 07/20/2020 CLINICAL DATA:  Recurrent syncope EXAM: CT HEAD WITHOUT CONTRAST CT CERVICAL SPINE WITHOUT CONTRAST TECHNIQUE: Multidetector CT imaging of the head and cervical spine was performed following the standard protocol without intravenous contrast. Multiplanar CT image reconstructions of the cervical spine were also generated. COMPARISON:  Head CT 10/18/2011 FINDINGS: CT HEAD FINDINGS Brain: No evidence of acute infarction, hemorrhage, hydrocephalus, extra-axial collection or mass lesion/mass effect. Mild inferior bifrontal encephalomalacia, a posttraumatic pattern that was also seen in 2013. Coarse calcification in the lateral left cerebellum, also stable. Degree of cerebellar volume loss greater than cerebral volume loss which is likely related to history of  ethanol abuse Vascular: No hyperdense vessel or unexpected calcification. Skull: Normal. Negative for fracture or focal lesion. Sinuses/Orbits: No evidence of injury CT CERVICAL SPINE FINDINGS Alignment: Normal. Skull base and vertebrae: No acute fracture. No primary bone lesion or focal pathologic process. Soft tissues and spinal canal: No prevertebral fluid or swelling. No visible canal hematoma. Disc levels:  Mid cervical disc narrowing and mild endplate ridging Upper chest: Negative Other: Cavity and periapical erosion in the right lower mandible. IMPRESSION: No evidence of intracranial or cervical spine injury. Electronically Signed   By: Marnee Spring M.D.   On: 07/20/2020 04:07   CT Cervical Spine Wo Contrast  Result Date: 07/20/2020 CLINICAL DATA:  Recurrent syncope EXAM: CT HEAD WITHOUT CONTRAST CT CERVICAL SPINE WITHOUT CONTRAST TECHNIQUE: Multidetector CT imaging of the head and cervical spine was performed following the standard protocol without intravenous contrast. Multiplanar CT image reconstructions of the cervical spine were also generated. COMPARISON:  Head CT 10/18/2011 FINDINGS: CT HEAD FINDINGS Brain: No evidence of acute infarction, hemorrhage, hydrocephalus, extra-axial collection or mass lesion/mass effect. Mild inferior bifrontal encephalomalacia, a posttraumatic pattern that was also seen in 2013. Coarse calcification in the lateral left cerebellum, also stable. Degree of cerebellar volume loss greater than cerebral volume loss which is likely related to history of ethanol abuse Vascular: No hyperdense vessel or unexpected calcification. Skull: Normal. Negative for fracture or focal lesion. Sinuses/Orbits: No evidence of injury CT CERVICAL SPINE FINDINGS Alignment: Normal. Skull base and vertebrae: No acute fracture. No primary bone lesion or focal pathologic process. Soft tissues and spinal canal:  No prevertebral fluid or swelling. No visible canal hematoma. Disc levels:  Mid  cervical disc narrowing and mild endplate ridging Upper chest: Negative Other: Cavity and periapical erosion in the right lower mandible. IMPRESSION: No evidence of intracranial or cervical spine injury. Electronically Signed   By: Marnee Spring M.D.   On: 07/20/2020 04:07    Procedures Procedures   Medications Ordered in ED Medications  sodium chloride 0.9 % bolus 1,000 mL (0 mLs Intravenous Stopped 07/20/20 0529)    ED Course  I have reviewed the triage vital signs and the nursing notes.  Pertinent labs & imaging results that were available during my care of the patient were reviewed by me and considered in my medical decision making (see chart for details).    MDM Rules/Calculators/A&P                          55 year old male with a history of alcohol use disorder, HTN, PE not on anticoagulation, pulmonary nodules, and recurrent syncope who presents to the emergency department by EMS.  Patient was found on the ground out in front of a motel just prior to arrival.  EMS was concern for alcohol use.  Patient reports that he had a syncopal episode that was not accompanied by prodromal symptoms.  Per chart review, he does have a history of similar.  Previously, he was found to have a PE and was anticoagulated on Eliquis until he states that the medication was discontinued due to intraoral bleeding several months ago.  He is not currently anticoagulated.  He has also followed up with cardiology for his symptoms and was found to have PSVT and atrial tachycardia and was advised to follow-up in 6 months, but per chart review has not followed up.  The patient was discussed with Dr. Clayborne Dana, attending physician.  Labs and imaging of been reviewed and independently interpreted by me. EKG was sinus tachycardia.  He has a metabolic acidosis that I suspect is secondary to chronic alcohol use given hypochloremia and hyponatremia.  Although blood pressure was stable on orthostatic vital signs, he did  become increasingly tachycardic, increasing almost 30 bpm.  IV fluid bolus given for hyponatremia.  Patient did previously have a normal D-dimer when adjusted for age.  D-dimer was obtained today and was elevated at 0.60, slightly elevated when age-adjusted.  Given IV contrast shortage, will order VQ scan since patient is at high risk for recurrent PE.  Less suspicious for pericarditis, myocarditis, ACS, acute CHF exacerbation, aortic dissection, or esophageal rupture.  Did consider rhabdomyolysis given concern for myalgias, but CK level was normal.  UA is not concerning for infection.  Transaminases are elevated, 2-1 AST to ALT, but I suspect that this is secondary to alcohol use.  Doubt cholecystitis or ascending cholangitis.  On re-evaluation, patient is resting comfortably and in no acute distress.  VQ scan is pending.  Patient care transferred to [facility] at the end of my shift to follow up on VQ scan. Patient presentation, ED course, and plan of care discussed with review of all pertinent labs and imaging. Please see his/her note for further details regarding further ED course and disposition.   Final Clinical Impression(s) / ED Diagnoses Final diagnoses:  Syncope, unspecified syncope type  Alcohol-induced metabolic disease  Cocaine use disorder (HCC)  Pleuritic chest pain    Rx / DC Orders ED Discharge Orders    None       Celestine Bougie  A, PA-C 07/20/20 0802    Mesner, Barbara CowerJason, MD 07/20/20 2326

## 2020-07-20 NOTE — Discharge Instructions (Signed)
Please follow-up with your cardiologist regarding your symptoms today.  If they worsen, please come back to the emergency department for immediate reevaluation.  I have attached resources in this paperwork for local counseling and substance abuse programs.  It was a pleasure to meet you.

## 2020-07-20 NOTE — ED Notes (Signed)
Pt transfered to NM

## 2020-07-20 NOTE — ED Notes (Signed)
Repeat EKG complete and PA is at the bedside at this time.  Pt spouse calling for results and requesting pain medication to be given to pt .

## 2021-02-22 ENCOUNTER — Other Ambulatory Visit: Payer: Self-pay

## 2021-02-22 ENCOUNTER — Emergency Department (HOSPITAL_COMMUNITY): Payer: Medicaid Other

## 2021-02-22 ENCOUNTER — Encounter (HOSPITAL_COMMUNITY): Payer: Self-pay | Admitting: Emergency Medicine

## 2021-02-22 ENCOUNTER — Emergency Department (HOSPITAL_COMMUNITY)
Admission: EM | Admit: 2021-02-22 | Discharge: 2021-02-23 | Disposition: A | Discharge status: Home / Self Care | Payer: Medicaid Other | Attending: Emergency Medicine | Admitting: Emergency Medicine

## 2021-02-22 DIAGNOSIS — R079 Chest pain, unspecified: Secondary | ICD-10-CM | POA: Insufficient documentation

## 2021-02-22 DIAGNOSIS — R0602 Shortness of breath: Secondary | ICD-10-CM | POA: Diagnosis not present

## 2021-02-22 DIAGNOSIS — R110 Nausea: Secondary | ICD-10-CM | POA: Insufficient documentation

## 2021-02-22 DIAGNOSIS — Z5321 Procedure and treatment not carried out due to patient leaving prior to being seen by health care provider: Secondary | ICD-10-CM | POA: Diagnosis not present

## 2021-02-22 LAB — CBC WITH DIFFERENTIAL/PLATELET
Abs Immature Granulocytes: 0.03 10*3/uL (ref 0.00–0.07)
Basophils Absolute: 0.1 10*3/uL (ref 0.0–0.1)
Basophils Relative: 1 %
Eosinophils Absolute: 0.1 10*3/uL (ref 0.0–0.5)
Eosinophils Relative: 1 %
HCT: 38.8 % — ABNORMAL LOW (ref 39.0–52.0)
Hemoglobin: 12.5 g/dL — ABNORMAL LOW (ref 13.0–17.0)
Immature Granulocytes: 0 %
Lymphocytes Relative: 26 %
Lymphs Abs: 1.9 10*3/uL (ref 0.7–4.0)
MCH: 23.1 pg — ABNORMAL LOW (ref 26.0–34.0)
MCHC: 32.2 g/dL (ref 30.0–36.0)
MCV: 71.6 fL — ABNORMAL LOW (ref 80.0–100.0)
Monocytes Absolute: 0.9 10*3/uL (ref 0.1–1.0)
Monocytes Relative: 13 %
Neutro Abs: 4.3 10*3/uL (ref 1.7–7.7)
Neutrophils Relative %: 59 %
Platelets: 332 10*3/uL (ref 150–400)
RBC: 5.42 MIL/uL (ref 4.22–5.81)
RDW: 16.8 % — ABNORMAL HIGH (ref 11.5–15.5)
WBC: 7.3 10*3/uL (ref 4.0–10.5)
nRBC: 0 % (ref 0.0–0.2)

## 2021-02-22 LAB — D-DIMER, QUANTITATIVE: D-Dimer, Quant: 0.42 ug/mL-FEU (ref 0.00–0.50)

## 2021-02-22 NOTE — ED Provider Notes (Signed)
Emergency Medicine Provider Triage Evaluation Note  Travis Reed. , a 55 y.o. male  was evaluated in triage.  Pt complains of chest pain x 2 days.  States his arms feels numb.  Has hx of blood clots in the past with similar symptoms.  Does have some SOB, nausea.  He is not longer on anticoagulation.  Review of Systems  Positive: Chest pain Negative: Fever, cough  Physical Exam  BP (!) 128/95    Pulse 95    Temp 98 F (36.7 C) (Oral)    Resp 16    Ht 6\' 3"  (1.905 m)    Wt 81.6 kg    SpO2 100%    BMI 22.50 kg/m   Gen:   Awake, no distress   Resp:  Normal effort  MSK:   Moves extremities without difficulty  Other:    Medical Decision Making  Medically screening exam initiated at 10:34 PM.  Appropriate orders placed.  Travis Reed . was informed that the remainder of the evaluation will be completed by another provider, this initial triage assessment does not replace that evaluation, and the importance of remaining in the ED until their evaluation is complete.  Chest pain.  Hx of PE.  He is not currently on anticoagulation.  VSS in triage without tachycardia or hypoxia.  EKG, labs including d-dimer, CXR.   [employer], PA-C 02/22/21 2239    2240, MD 02/23/21 [phone]

## 2021-02-22 NOTE — ED Triage Notes (Addendum)
Pt here POV with c/o of chest pain X2 days. Pt states it feels like a "knot" in the middle of his chest. Endorses SOB, nausea, vomiting. Pt states he has history of blood clots. Denies being on thinners.

## 2021-02-23 LAB — BASIC METABOLIC PANEL
Anion gap: 17 — ABNORMAL HIGH (ref 5–15)
BUN: 7 mg/dL (ref 6–20)
CO2: 22 mmol/L (ref 22–32)
Calcium: 9.5 mg/dL (ref 8.9–10.3)
Chloride: 97 mmol/L — ABNORMAL LOW (ref 98–111)
Creatinine, Ser: 0.92 mg/dL (ref 0.61–1.24)
GFR, Estimated: >60 mL/min (ref >60)
Glucose, Bld: 78 mg/dL (ref 70–99)
Potassium: 3.4 mmol/L — ABNORMAL LOW (ref 3.5–5.1)
Sodium: 136 mmol/L (ref 135–145)

## 2021-02-23 LAB — BRAIN NATRIURETIC PEPTIDE: B Natriuretic Peptide: 50.1 pg/mL (ref 0.0–100.0)

## 2021-02-23 LAB — TROPONIN I (HIGH SENSITIVITY): Troponin I (High Sensitivity): 7 ng/L (ref <18)

## 2021-02-23 NOTE — ED Notes (Signed)
Patient states he is leaving. 

## 2021-09-10 ENCOUNTER — Other Ambulatory Visit: Payer: Self-pay

## 2021-09-10 ENCOUNTER — Encounter (HOSPITAL_COMMUNITY): Payer: Self-pay

## 2021-09-10 ENCOUNTER — Emergency Department (HOSPITAL_COMMUNITY)
Admission: EM | Admit: 2021-09-10 | Discharge: 2021-09-10 | Discharge status: Against Medical Advice | Payer: Medicaid Other | Attending: Emergency Medicine | Admitting: Emergency Medicine

## 2021-09-10 DIAGNOSIS — R0781 Pleurodynia: Secondary | ICD-10-CM | POA: Insufficient documentation

## 2021-09-10 DIAGNOSIS — R109 Unspecified abdominal pain: Secondary | ICD-10-CM | POA: Insufficient documentation

## 2021-09-10 DIAGNOSIS — G8929 Other chronic pain: Secondary | ICD-10-CM | POA: Diagnosis not present

## 2021-09-10 DIAGNOSIS — Z5321 Procedure and treatment not carried out due to patient leaving prior to being seen by health care provider: Secondary | ICD-10-CM | POA: Insufficient documentation

## 2021-09-10 DIAGNOSIS — R569 Unspecified convulsions: Secondary | ICD-10-CM | POA: Diagnosis not present

## 2021-09-10 LAB — CBC WITH DIFFERENTIAL/PLATELET
Abs Immature Granulocytes: 0 10*3/uL (ref 0.00–0.07)
Basophils Absolute: 0.1 10*3/uL (ref 0.0–0.1)
Basophils Relative: 1 %
Eosinophils Absolute: 0.1 10*3/uL (ref 0.0–0.5)
Eosinophils Relative: 1 %
HCT: 37.2 % — ABNORMAL LOW (ref 39.0–52.0)
Hemoglobin: 12.4 g/dL — ABNORMAL LOW (ref 13.0–17.0)
Immature Granulocytes: 0 %
Lymphocytes Relative: 31 %
Lymphs Abs: 1.6 10*3/uL (ref 0.7–4.0)
MCH: 23.4 pg — ABNORMAL LOW (ref 26.0–34.0)
MCHC: 33.3 g/dL (ref 30.0–36.0)
MCV: 70.1 fL — ABNORMAL LOW (ref 80.0–100.0)
Monocytes Absolute: 0.5 10*3/uL (ref 0.1–1.0)
Monocytes Relative: 10 %
Neutro Abs: 2.8 10*3/uL (ref 1.7–7.7)
Neutrophils Relative %: 57 %
Platelets: 351 10*3/uL (ref 150–400)
RBC: 5.31 MIL/uL (ref 4.22–5.81)
RDW: 16.5 % — ABNORMAL HIGH (ref 11.5–15.5)
WBC: 5 10*3/uL (ref 4.0–10.5)
nRBC: 0 % (ref 0.0–0.2)

## 2021-09-10 LAB — COMPREHENSIVE METABOLIC PANEL
ALT: 61 U/L — ABNORMAL HIGH (ref 0–44)
AST: 94 U/L — ABNORMAL HIGH (ref 15–41)
Albumin: 4.2 g/dL (ref 3.5–5.0)
Alkaline Phosphatase: 73 U/L (ref 38–126)
Anion gap: 11 (ref 5–15)
BUN: 9 mg/dL (ref 6–20)
CO2: 23 mmol/L (ref 22–32)
Calcium: 9.1 mg/dL (ref 8.9–10.3)
Chloride: 102 mmol/L (ref 98–111)
Creatinine, Ser: 0.9 mg/dL (ref 0.61–1.24)
GFR, Estimated: >60 mL/min (ref >60)
Glucose, Bld: 92 mg/dL (ref 70–99)
Potassium: 3.8 mmol/L (ref 3.5–5.1)
Sodium: 136 mmol/L (ref 135–145)
Total Bilirubin: 0.6 mg/dL (ref 0.3–1.2)
Total Protein: 7.6 g/dL (ref 6.5–8.1)

## 2021-09-10 LAB — ETHANOL: Alcohol, Ethyl (B): 301 mg/dL (ref <10)

## 2021-09-10 LAB — LIPASE, BLOOD: Lipase: 44 U/L (ref 11–51)

## 2021-09-10 MED ORDER — LORAZEPAM 2 MG/ML IJ SOLN: 1.0000 mg | INTRAMUSCULAR | Status: DC | PRN

## 2021-09-10 MED ORDER — THIAMINE HCL 100 MG PO TABS: 100.0000 mg | ORAL_TABLET | Freq: Every day | ORAL | Status: DC

## 2021-09-10 MED ORDER — FOLIC ACID 1 MG PO TABS: 1.0000 mg | ORAL_TABLET | Freq: Every day | ORAL | Status: DC

## 2021-09-10 MED ORDER — ADULT MULTIVITAMIN W/MINERALS CH: 1.0000 | ORAL_TABLET | Freq: Every day | ORAL | Status: DC

## 2021-09-10 MED ORDER — LORAZEPAM 1 MG PO TABS: 1.0000 mg | ORAL_TABLET | ORAL | Status: DC | PRN

## 2021-09-10 MED ORDER — THIAMINE HCL 100 MG/ML IJ SOLN
100.0000 mg | Freq: Every day | INTRAMUSCULAR | Status: DC
  Filled 2021-09-10: qty 1

## 2021-09-10 NOTE — ED Notes (Signed)
Iv removed

## 2021-09-10 NOTE — ED Provider Triage Note (Signed)
Emergency Medicine Provider Triage Evaluation Note  Travis Reed. , a 56 y.o. male  was evaluated in triage.  Pt complains of chronic abdominal pain, new onset right sided rib pain. Family member also reports that he has seizures whenever he "drinks too much alcohol".  Patient complains of multiple complaints including right rib pain.  He is a poor historian along with his family member.  Patient reports he is a daily alcohol user and did consume alcohol this morning as well.  He has a history of blood clots and seizures, however has not been on blood thinners or on seizure medications in a few years.  Review of Systems  Positive:  Negative:   Physical Exam  BP (!) 112/92 (BP Location: Right Arm)   Pulse 83   Temp 98.2 F (36.8 C) (Oral)   Resp 15   SpO2 99%  Gen:   Awake, no distress   Resp:  Normal effort  MSK:   Moves extremities without difficulty  Other:  Diffuse abdominal tenderness without guarding.  Tenderness to bilateral ribs as well.  Abdomen is soft. Chest tenderness. No step off or deformity. Patient irritable and not cooperating with examination for neuro/   Medical Decision Making  Medically screening exam initiated at 2:29 PM.  Appropriate orders placed.  Travis Reed. was informed that the remainder of the evaluation will be completed by another provider, this initial triage assessment does not replace that evaluation, and the importance of remaining in the ED until their evaluation is complete.  Given the patient had an unwitnessed seizure yesterday, will order CT of head, neck, and chest abdomen pelvis given his diffuse pain.  We will order basic lab work as well. Vitals are stable.    [name], PA-C 09/10/21 1440

## 2021-09-10 NOTE — ED Triage Notes (Signed)
Patient states he has abdominal pain that started this morning, wife states he has chronic abdominal pain, also states he had a seizure yesterday evening, pain is generalized in abdomin and along his sides, painful to touch, also has complaint of painful right eye, admits to drinking today and chronically.

## 2021-09-10 NOTE — ED Notes (Signed)
Pt stated that he is leaving. I asked if his family is here he said no I'm good. He said ill will come back if I need to on another day I'm not staying here 24 hours

## 2022-07-01 ENCOUNTER — Ambulatory Visit: Payer: Medicaid Other | Admitting: Podiatry

## 2022-11-11 ENCOUNTER — Emergency Department (HOSPITAL_COMMUNITY): Payer: 59

## 2022-11-11 ENCOUNTER — Inpatient Hospital Stay (HOSPITAL_COMMUNITY)
Admission: EM | Admit: 2022-11-11 | Discharge: 2022-11-14 | DRG: 176 | Disposition: A | Discharge status: Home / Self Care | Payer: 59 | Attending: Internal Medicine | Admitting: Internal Medicine

## 2022-11-11 ENCOUNTER — Other Ambulatory Visit: Payer: Self-pay

## 2022-11-11 ENCOUNTER — Encounter (HOSPITAL_COMMUNITY): Payer: Self-pay

## 2022-11-11 DIAGNOSIS — E871 Hypo-osmolality and hyponatremia: Secondary | ICD-10-CM | POA: Diagnosis present

## 2022-11-11 DIAGNOSIS — I82461 Acute embolism and thrombosis of right calf muscular vein: Secondary | ICD-10-CM | POA: Diagnosis present

## 2022-11-11 DIAGNOSIS — R0781 Pleurodynia: Secondary | ICD-10-CM | POA: Diagnosis present

## 2022-11-11 DIAGNOSIS — I2609 Other pulmonary embolism with acute cor pulmonale: Secondary | ICD-10-CM

## 2022-11-11 DIAGNOSIS — Z83438 Family history of other disorder of lipoprotein metabolism and other lipidemia: Secondary | ICD-10-CM

## 2022-11-11 DIAGNOSIS — M549 Dorsalgia, unspecified: Secondary | ICD-10-CM | POA: Diagnosis present

## 2022-11-11 DIAGNOSIS — I82411 Acute embolism and thrombosis of right femoral vein: Secondary | ICD-10-CM | POA: Diagnosis present

## 2022-11-11 DIAGNOSIS — I10 Essential (primary) hypertension: Secondary | ICD-10-CM | POA: Diagnosis present

## 2022-11-11 DIAGNOSIS — M7989 Other specified soft tissue disorders: Secondary | ICD-10-CM | POA: Diagnosis not present

## 2022-11-11 DIAGNOSIS — F17210 Nicotine dependence, cigarettes, uncomplicated: Secondary | ICD-10-CM | POA: Diagnosis present

## 2022-11-11 DIAGNOSIS — E872 Acidosis, unspecified: Secondary | ICD-10-CM | POA: Diagnosis present

## 2022-11-11 DIAGNOSIS — Z880 Allergy status to penicillin: Secondary | ICD-10-CM

## 2022-11-11 DIAGNOSIS — R52 Pain, unspecified: Secondary | ICD-10-CM | POA: Diagnosis not present

## 2022-11-11 DIAGNOSIS — Z8249 Family history of ischemic heart disease and other diseases of the circulatory system: Secondary | ICD-10-CM

## 2022-11-11 DIAGNOSIS — I824Y1 Acute embolism and thrombosis of unspecified deep veins of right proximal lower extremity: Secondary | ICD-10-CM | POA: Diagnosis not present

## 2022-11-11 DIAGNOSIS — Z79899 Other long term (current) drug therapy: Secondary | ICD-10-CM | POA: Diagnosis not present

## 2022-11-11 DIAGNOSIS — Z7901 Long term (current) use of anticoagulants: Secondary | ICD-10-CM

## 2022-11-11 DIAGNOSIS — Z833 Family history of diabetes mellitus: Secondary | ICD-10-CM

## 2022-11-11 DIAGNOSIS — I2699 Other pulmonary embolism without acute cor pulmonale: Principal | ICD-10-CM | POA: Diagnosis present

## 2022-11-11 DIAGNOSIS — F1010 Alcohol abuse, uncomplicated: Secondary | ICD-10-CM | POA: Diagnosis present

## 2022-11-11 LAB — BASIC METABOLIC PANEL
Anion gap: 13 (ref 5–15)
Anion gap: 16 — ABNORMAL HIGH (ref 5–15)
BUN: 5 mg/dL — ABNORMAL LOW (ref 6–20)
BUN: 6 mg/dL (ref 6–20)
CO2: 22 mmol/L (ref 22–32)
CO2: 21 mmol/L — ABNORMAL LOW (ref 22–32)
Calcium: 8.9 mg/dL (ref 8.9–10.3)
Calcium: 8.6 mg/dL — ABNORMAL LOW (ref 8.9–10.3)
Chloride: 96 mmol/L — ABNORMAL LOW (ref 98–111)
Chloride: 95 mmol/L — ABNORMAL LOW (ref 98–111)
Creatinine, Ser: 0.88 mg/dL (ref 0.61–1.24)
Creatinine, Ser: 0.94 mg/dL (ref 0.61–1.24)
GFR, Estimated: >60 mL/min (ref >60)
GFR, Estimated: >60 mL/min (ref >60)
Glucose, Bld: 90 mg/dL (ref 70–99)
Glucose, Bld: 140 mg/dL — ABNORMAL HIGH (ref 70–99)
Potassium: 4.3 mmol/L (ref 3.5–5.1)
Potassium: 4 mmol/L (ref 3.5–5.1)
Sodium: 131 mmol/L — ABNORMAL LOW (ref 135–145)
Sodium: 132 mmol/L — ABNORMAL LOW (ref 135–145)

## 2022-11-11 LAB — CBC
HCT: 43.5 % (ref 39.0–52.0)
Hemoglobin: 14.4 g/dL (ref 13.0–17.0)
MCH: 23.8 pg — ABNORMAL LOW (ref 26.0–34.0)
MCHC: 33.1 g/dL (ref 30.0–36.0)
MCV: 72 fL — ABNORMAL LOW (ref 80.0–100.0)
Platelets: 283 10*3/uL (ref 150–400)
RBC: 6.04 MIL/uL — ABNORMAL HIGH (ref 4.22–5.81)
RDW: 17.5 % — ABNORMAL HIGH (ref 11.5–15.5)
WBC: 9.7 10*3/uL (ref 4.0–10.5)
nRBC: 0 % (ref 0.0–0.2)

## 2022-11-11 LAB — ECHOCARDIOGRAM COMPLETE
AR max vel: 2.95 cm2
AV Area VTI: 2.83 cm2
AV Area mean vel: 2.77 cm2
AV Mean grad: 4.5 mmHg
AV Peak grad: 8.4 mmHg
Ao pk vel: 1.45 m/s
Area-P 1/2: 3.72 cm2
Height: 75 in
S' Lateral: 2.7 cm
Weight: 2960 [oz_av]

## 2022-11-11 LAB — TROPONIN I (HIGH SENSITIVITY)
Troponin I (High Sensitivity): 5 ng/L (ref <18)
Troponin I (High Sensitivity): 5 ng/L (ref <18)
Troponin I (High Sensitivity): 6 ng/L
Troponin I (High Sensitivity): 5 ng/L (ref <18)

## 2022-11-11 LAB — HIV ANTIBODY (ROUTINE TESTING W REFLEX): HIV Screen 4th Generation wRfx: NONREACTIVE

## 2022-11-11 LAB — HEMOGLOBIN A1C
Hgb A1c MFr Bld: 4.7 % — ABNORMAL LOW (ref 4.8–5.6)
Mean Plasma Glucose: 88.19 mg/dL

## 2022-11-11 LAB — OSMOLALITY: Osmolality: 285 mosm/kg (ref 275–295)

## 2022-11-11 LAB — MAGNESIUM: Magnesium: 2 mg/dL (ref 1.7–2.4)

## 2022-11-11 LAB — LACTIC ACID, PLASMA: Lactic Acid, Venous: 2.3 mmol/L (ref 0.5–1.9)

## 2022-11-11 LAB — BRAIN NATRIURETIC PEPTIDE: B Natriuretic Peptide: 24.4 pg/mL (ref 0.0–100.0)

## 2022-11-11 LAB — HEPARIN LEVEL (UNFRACTIONATED): Heparin Unfractionated: 0.6 [IU]/mL (ref 0.30–0.70)

## 2022-11-11 LAB — TSH: TSH: 1.986 u[IU]/mL (ref 0.350–4.500)

## 2022-11-11 MED ORDER — MORPHINE SULFATE (PF) 2 MG/ML IV SOLN
2.0000 mg | INTRAVENOUS | Status: DC | PRN
  Administered 2022-11-11: 2 mg via INTRAVENOUS
  Filled 2022-11-11: qty 1

## 2022-11-11 MED ORDER — ACETAMINOPHEN 500 MG PO TABS
1000.0000 mg | ORAL_TABLET | Freq: Once | ORAL | Status: AC
  Administered 2022-11-11: 1000 mg via ORAL
  Filled 2022-11-11: qty 2

## 2022-11-11 MED ORDER — ONDANSETRON HCL 4 MG/2ML IJ SOLN
4.0000 mg | Freq: Once | INTRAMUSCULAR | Status: AC
  Administered 2022-11-11: 4 mg via INTRAVENOUS
  Filled 2022-11-11: qty 2

## 2022-11-11 MED ORDER — THIAMINE MONONITRATE 100 MG PO TABS
100.0000 mg | ORAL_TABLET | Freq: Every day | ORAL | Status: DC
  Administered 2022-11-11 – 2022-11-14 (×4): 100 mg via ORAL
  Filled 2022-11-11 (×4): qty 1

## 2022-11-11 MED ORDER — LACTATED RINGERS IV BOLUS: 1000.0000 mL | Freq: Once | INTRAVENOUS | Status: DC

## 2022-11-11 MED ORDER — HEPARIN BOLUS VIA INFUSION
5900.0000 [IU] | Freq: Once | INTRAVENOUS | Status: AC
  Administered 2022-11-11: 5900 [IU] via INTRAVENOUS
  Filled 2022-11-11: qty 5900

## 2022-11-11 MED ORDER — THIAMINE HCL 100 MG/ML IJ SOLN: 100.0000 mg | Freq: Every day | INTRAMUSCULAR | Status: DC

## 2022-11-11 MED ORDER — MORPHINE SULFATE (PF) 4 MG/ML IV SOLN
4.0000 mg | Freq: Once | INTRAVENOUS | Status: AC
  Administered 2022-11-11: 4 mg via INTRAVENOUS
  Filled 2022-11-11: qty 1

## 2022-11-11 MED ORDER — MORPHINE SULFATE (PF) 2 MG/ML IV SOLN
2.0000 mg | Freq: Once | INTRAVENOUS | Status: AC
  Administered 2022-11-11: 2 mg via INTRAVENOUS
  Filled 2022-11-11: qty 1

## 2022-11-11 MED ORDER — LORAZEPAM 2 MG/ML IJ SOLN: 1.0000 mg | INTRAMUSCULAR | Status: AC | PRN

## 2022-11-11 MED ORDER — HYDROMORPHONE HCL 1 MG/ML IJ SOLN
1.0000 mg | INTRAMUSCULAR | Status: DC | PRN
  Administered 2022-11-11 – 2022-11-14 (×15): 1 mg via INTRAVENOUS
  Filled 2022-11-11 (×15): qty 1

## 2022-11-11 MED ORDER — IOHEXOL 350 MG/ML SOLN
70.0000 mL | Freq: Once | INTRAVENOUS | Status: AC | PRN
  Administered 2022-11-11: 70 mL via INTRAVENOUS

## 2022-11-11 MED ORDER — ADULT MULTIVITAMIN W/MINERALS CH
1.0000 | ORAL_TABLET | Freq: Every day | ORAL | Status: DC
  Administered 2022-11-11 – 2022-11-14 (×4): 1 via ORAL
  Filled 2022-11-11 (×4): qty 1

## 2022-11-11 MED ORDER — ONDANSETRON HCL 4 MG PO TABS: 4.0000 mg | ORAL_TABLET | Freq: Four times a day (QID) | ORAL | Status: DC | PRN

## 2022-11-11 MED ORDER — HEPARIN (PORCINE) 25000 UT/250ML-% IV SOLN
1350.0000 [IU]/h | INTRAVENOUS | Status: DC
  Administered 2022-11-11 (×2): 1400 [IU]/h via INTRAVENOUS
  Administered 2022-11-12: 1350 [IU]/h via INTRAVENOUS
  Filled 2022-11-11 (×3): qty 250

## 2022-11-11 MED ORDER — ACETAMINOPHEN 10 MG/ML IV SOLN
1000.0000 mg | Freq: Once | INTRAVENOUS | Status: AC
  Administered 2022-11-11: 1000 mg via INTRAVENOUS
  Filled 2022-11-11: qty 100

## 2022-11-11 MED ORDER — LACTATED RINGERS IV BOLUS
500.0000 mL | Freq: Once | INTRAVENOUS | Status: AC
  Administered 2022-11-11: 500 mL via INTRAVENOUS

## 2022-11-11 MED ORDER — KETOROLAC TROMETHAMINE 15 MG/ML IJ SOLN
15.0000 mg | Freq: Once | INTRAMUSCULAR | Status: DC
  Filled 2022-11-11: qty 1

## 2022-11-11 MED ORDER — ONDANSETRON HCL 4 MG/2ML IJ SOLN: 4.0000 mg | Freq: Four times a day (QID) | INTRAMUSCULAR | Status: DC | PRN

## 2022-11-11 MED ORDER — LORAZEPAM 1 MG PO TABS
1.0000 mg | ORAL_TABLET | ORAL | Status: AC | PRN
  Administered 2022-11-11 – 2022-11-12 (×3): 1 mg via ORAL
  Filled 2022-11-11 (×3): qty 1

## 2022-11-11 MED ORDER — FOLIC ACID 1 MG PO TABS
1.0000 mg | ORAL_TABLET | Freq: Every day | ORAL | Status: DC
  Administered 2022-11-11 – 2022-11-14 (×4): 1 mg via ORAL
  Filled 2022-11-11 (×6): qty 1

## 2022-11-11 MED ORDER — ALBUTEROL SULFATE (2.5 MG/3ML) 0.083% IN NEBU: 2.5000 mg | INHALATION_SOLUTION | RESPIRATORY_TRACT | Status: DC | PRN

## 2022-11-11 NOTE — Progress Notes (Addendum)
VASCULAR LAB    Bilateral lower extremity venous duplex has been performed.  See CV proc for preliminary results.  Gave results to Dr. Rodena Medin and Riki Sheer, PA-C  [facility], [facility], RVT 11/11/2022, 10:04 AM

## 2022-11-11 NOTE — ED Provider Notes (Signed)
[place] EMERGENCY DEPARTMENT AT [facility] Provider Note   CSN: [id number] Arrival date & time: 11/11/22  4401     History  Chief Complaint  Patient presents with   Chest Pain   Rt leg pain   HPI Travis Reed. is a 57 y.o. male with history of pulmonary embolism presenting for chest pain that started this morning.  Located on the right side of his chest and it does hurt worse when he takes deep breaths.  Also endorsing pain behind the right leg.  The pain in the leg begins in the right calf and extends upward to the right mid thigh.  Also has noticed that the right lower leg is more swollen in comparison to the left.  States the last time he had the symptoms he had a "clot".  States that this was 2 years ago.  States at that time he was taking Eliquis but is no longer taking Eliquis.  Also endorses associated shortness of breath.   Chest Pain      Home Medications Prior to Admission medications   Medication Sig Start Date End Date Taking? Authorizing Provider  diltiazem (CARDIZEM CD) 120 MG 24 hr capsule Take 1 capsule (120 mg total) by mouth daily. 04/23/19  Yes Hoy Register, MD  apixaban (ELIQUIS) 5 MG TABS tablet Take 1 tablet (5 mg total) by mouth 2 (two) times daily. Patient not taking: Reported on 07/20/2020 04/23/19   Hoy Register, MD  chlordiazePOXIDE (LIBRIUM) 25 MG capsule 50mg  PO TID x 1D, then 25-50mg  PO BID X 1D, then 25-50mg  PO QD X 1D Patient not taking: No sig reported 04/06/19   Mannie Stabile, PA-C  cyclobenzaprine (FLEXERIL) 10 MG tablet Take 1 tablet (10 mg total) by mouth 2 (two) times daily as needed for up to 20 doses for muscle spasms. Patient not taking: Reported on 07/20/2020 10/17/19   Virgina Norfolk, DO  diclofenac Sodium (VOLTAREN) 1 % GEL Apply 4 g topically 4 (four) times daily. Patient not taking: Reported on 07/20/2020 04/22/19   Hoy Register, MD  famotidine (PEPCID) 20 MG tablet Take 1 tablet (20 mg total) by mouth 2 (two)  times daily. For stomach protection Patient not taking: No sig reported 09/18/18   Fulp, Cammie, MD  nicotine (NICODERM CQ - DOSED IN MG/24 HR) 7 mg/24hr patch Place 1 patch (7 mg total) onto the skin daily. Patient not taking: No sig reported 07/21/18   Hoy Register, MD  traMADol (ULTRAM) 50 MG tablet Take 1 tablet (50 mg total) by mouth every 12 (twelve) hours as needed for moderate pain. Patient not taking: No sig reported 09/18/18   Cain Saupe, MD      Allergies    Penicillins    Review of Systems   Review of Systems  Cardiovascular:  Positive for chest pain.    Physical Exam Updated Vital Signs BP (!) 131/99   Pulse 78   Temp 98.2 F (36.8 C) (Oral)   Resp 14   Ht 6\' 3"  (1.905 m)   Wt 83.9 kg   SpO2 100%   BMI 23.12 kg/m  Physical Exam Vitals and nursing note reviewed.  HENT:     Head: Normocephalic and atraumatic.     Mouth/Throat:     Mouth: Mucous membranes are moist.  Eyes:     General:        Right eye: No discharge.        Left eye: No discharge.  Conjunctiva/sclera: Conjunctivae normal.  Cardiovascular:     Rate and Rhythm: Normal rate and regular rhythm.     Pulses: Normal pulses.     Heart sounds: Normal heart sounds.  Pulmonary:     Effort: Pulmonary effort is normal.     Breath sounds: Normal breath sounds.  Abdominal:     General: Abdomen is flat.     Palpations: Abdomen is soft.  Musculoskeletal:     Right lower leg: Swelling present.     Comments: Right sided calf tenderness with palpation.  Pedal pulses are 2+.  Skin:    General: Skin is warm and dry.  Neurological:     General: No focal deficit present.  Psychiatric:        Mood and Affect: Mood normal.     ED Results / Procedures / Treatments   Labs (all labs ordered are listed, but only abnormal results are displayed) Labs Reviewed  BASIC METABOLIC PANEL - Abnormal; Notable for the following components:      Result Value   Sodium 131 (*)    Chloride 96 (*)    BUN 5 (*)     All other components within normal limits  CBC - Abnormal; Notable for the following components:   RBC 6.04 (*)    MCV 72.0 (*)    MCH 23.8 (*)    RDW 17.5 (*)    All other components within normal limits  LACTIC ACID, PLASMA  BRAIN NATRIURETIC PEPTIDE  HEPARIN LEVEL (UNFRACTIONATED)  TROPONIN I (HIGH SENSITIVITY)  TROPONIN I (HIGH SENSITIVITY)    EKG EKG Interpretation Date/Time:  Monday November 11 2022 07:06:56 EDT Ventricular Rate:  89 PR Interval:  140 QRS Duration:  70 QT Interval:  370 QTC Calculation: 450 R Axis:   77  Text Interpretation: Normal sinus rhythm Normal ECG When compared with ECG of 10-Sep-2021 14:21, PREVIOUS ECG IS PRESENT Confirmed by Kristine Royal [phone]) on 11/11/2022 9:28:12 AM  Radiology VAS [place] LOWER EXTREMITY VENOUS (DVT) (7a-7p)  Result Date: 11/11/2022  Lower Venous DVT Study Patient Name:  Travis Reed.  Date of Exam:   11/11/2022 Medical Rec #: [id number]            Accession #:    [id number] Date of Birth: 19-Mar-1965            Patient Gender: M Patient Age:   52 years Exam Location:  [facility] Procedure:      VAS [place] LOWER EXTREMITY VENOUS (DVT) Referring Phys: Riki Sheer --------------------------------------------------------------------------------  Indications: Pain, and Swelling.  Risk Factors: Remote history. Limitations: Abdominal gas. Comparison Study: No prior study on file Performing Technologist: Sherren Kerns RVS  Examination Guidelines: A complete evaluation includes B-mode imaging, spectral Doppler, color Doppler, and power Doppler as needed of all accessible portions of each vessel. Bilateral testing is considered an integral part of a complete examination. Limited examinations for reoccurring indications may be performed as noted. The reflux portion of the exam is performed with the patient in reverse Trendelenburg.  +----------+---------------+---------+-----------+----------+--------------+ RIGHT      CompressibilityPhasicitySpontaneityPropertiesThrombus Aging +----------+---------------+---------+-----------+----------+--------------+ CFV       None           No       No                   Acute          +----------+---------------+---------+-----------+----------+--------------+ SFJ       None  No       No                   Acute          +----------+---------------+---------+-----------+----------+--------------+ FV Prox   None           No       No                   Acute          +----------+---------------+---------+-----------+----------+--------------+ FV Mid    None           No       No                   Acute          +----------+---------------+---------+-----------+----------+--------------+ FV Distal None           No       No                   Acute          +----------+---------------+---------+-----------+----------+--------------+ PFV       None           No       No                   Acute          +----------+---------------+---------+-----------+----------+--------------+ POP       None           No       No                   Acute          +----------+---------------+---------+-----------+----------+--------------+ PTV       None           No       No                   Acute          +----------+---------------+---------+-----------+----------+--------------+ PERO      None           No       No                   Acute          +----------+---------------+---------+-----------+----------+--------------+ Soleal    None           No       No                   Acute          +----------+---------------+---------+-----------+----------+--------------+ Gastroc   None           No       No                   Acute          +----------+---------------+---------+-----------+----------+--------------+ EIV                      Yes      Yes                  Acute           +----------+---------------+---------+-----------+----------+--------------+ CIV distal               Yes      Yes  Acute          +----------+---------------+---------+-----------+----------+--------------+ Partial acute thrombus noted in the external iliac and distal common iliac veins Unable to visualize higher secondary to abdominal gas.  +----+---------------+---------+-----------+----------+--------------+ LEFTCompressibilityPhasicitySpontaneityPropertiesThrombus Aging +----+---------------+---------+-----------+----------+--------------+ CFV Full           Yes      Yes                                 +----+---------------+---------+-----------+----------+--------------+     Summary: RIGHT: - Findings consistent with acute deep vein thrombosis involving the right common femoral vein, SF junction, right femoral vein, right proximal profunda vein, right popliteal vein, right posterior tibial veins, right peroneal veins, and partial thrombus noted into, at least, the EIV and distal CIV. Findings consistent with acute intramuscular thrombosis involving the right gastrocnemius veins, and right soleal veins. - No cystic structure found in the popliteal fossa.  LEFT: - No evidence of common femoral vein obstruction.   *See table(s) above for measurements and observations.    Preliminary    CT Angio Chest PE W/Cm &/Or Wo Cm  Result Date: 11/11/2022 CLINICAL DATA:  Right-sided chest pain and dyspnea associated with two-week history of intermittent leg swelling EXAM: CT ANGIOGRAPHY CHEST WITH CONTRAST TECHNIQUE: Multidetector CT imaging of the chest was performed using the standard protocol during bolus administration of intravenous contrast. Multiplanar CT image reconstructions and MIPs were obtained to evaluate the vascular anatomy. RADIATION DOSE REDUCTION: This exam was performed according to the departmental dose-optimization program which includes automated exposure  control, adjustment of the mA and/or kV according to patient size and/or use of iterative reconstruction technique. CONTRAST:  70mL OMNIPAQUE IOHEXOL 350 MG/ML SOLN COMPARISON:  CTA chest dated 02/22/2019 FINDINGS: Cardiovascular: The study is high quality for the evaluation of pulmonary embolism. There are multifocal bilateral filling defects, most proximally within the right upper lobar artery. Multiple additional foci of bilateral segmental and subsegmental filling defects. Great vessels are normal in course and caliber. Normal heart size. Right ventricle to left ventricle ratio = 1 with flattening of the interventricular septum. No significant pericardial fluid/thickening. Mediastinum/Nodes: Imaged thyroid gland without nodules meeting criteria for imaging follow-up by size. Normal esophagus. No pathologically enlarged axillary, supraclavicular, mediastinal, or hilar lymph nodes. Lungs/Pleura: The central airways are patent. Similar biapical irregular pleural-parenchymal scarring. Peripheral ground-glass opacity in the right middle lobe base. No pneumothorax. No pleural effusion. Upper abdomen: Normal. Musculoskeletal: No acute or abnormal lytic or blastic osseous lesions. Multilevel degenerative changes of the thoracic spine. Review of the MIP images confirms the above findings. IMPRESSION: 1. Bilateral pulmonary emboli, this proximally within the right upper lobar artery. 2. Right ventricle to left ventricle ratio = 1 with flattening of the interventricular septum, which can be seen in the setting of right heart strain. 3. Peripheral ground-glass opacity in the right middle lobe base, suspicious for pulmonary infarct. Critical Value/emergent results were called by telephone at the time of interpretation on 11/11/2022 at 9:48 am to provider Riki Sheer , who verbally acknowledged these results. Electronically Signed   By: Agustin Cree M.D.   On: 11/11/2022 09:50   DG Chest 2 View  Result Date:  11/11/2022 CLINICAL DATA:  Chest pain, right leg pain EXAM: CHEST - 2 VIEW COMPARISON:  02/22/2021 FINDINGS: Lungs are clear.  No pneumothorax. Heart size and mediastinal contours are within normal limits. No effusion. Visualized bones unremarkable. IMPRESSION: No acute cardiopulmonary disease. Electronically Signed  By: Corlis Leak M.D.   On: 11/11/2022 08:01    Procedures .Critical Care  Performed by: Gareth Eagle, PA-C Authorized by: Gareth Eagle, PA-C   Critical care provider statement:    Critical care time (minutes):  30   Critical care was necessary to treat or prevent imminent or life-threatening deterioration of the following conditions: PE with evidence of right heart strain.  Placed on IV heparin.   Critical care was time spent personally by me on the following activities:  Development of treatment plan with patient or surrogate, discussions with consultants, evaluation of patient's response to treatment, examination of patient, ordering and review of laboratory studies, ordering and review of radiographic studies, ordering and performing treatments and interventions, pulse oximetry, re-evaluation of patient's condition and review of old charts     Medications Ordered in ED Medications  heparin ADULT infusion 100 units/mL (25000 units/289mL) (1,400 Units/hr Intravenous New Bag/Given 11/11/22 1059)  morphine (PF) 4 MG/ML injection 4 mg (4 mg Intravenous Given 11/11/22 0831)  ondansetron (ZOFRAN) injection 4 mg (4 mg Intravenous Given 11/11/22 0831)  iohexol (OMNIPAQUE) 350 MG/ML injection 70 mL (70 mLs Intravenous Contrast Given 11/11/22 0932)  heparin bolus via infusion 5,900 Units (5,900 Units Intravenous Bolus from Bag 11/11/22 1056)    ED Course/ Medical Decision Making/ A&P Clinical Course as of 11/11/22 1104  Mon Nov 11, 2022  1610 Per radiology, bilateral PE RV/LV ratio close to 1 concern for rt heart strain. Infarct in right middle lobe  [JR]    Clinical Course User  Index [JR] Gareth Eagle, PA-C                                 Medical Decision Making Amount and/or Complexity of Data Reviewed Labs: ordered. Radiology: ordered.  Risk Prescription drug management. Decision regarding hospitalization.   Initial Impression and Ddx 57 year old well-appearing male presenting for chest pain and right leg pain and swelling.  Exam notable for right lower leg edema and calf tenderness.  DDx includes DVT, PE, ACS, pneumonia, pneumothorax, cellulitis, lower extremity fracture dislocation, PAD. Patient PMH that increases complexity of ED encounter: Remote history of PE not on Eliquis at this time.  Interpretation of Diagnostics - I independent reviewed and interpreted the labs as followed: Hyponatremia  - I independently visualized the following imaging with scope of interpretation limited to determining acute life threatening conditions related to emergency care: CTA chest, which revealed bilateral PE with evidence of right heart strain and right sided pulmonary infarct.  Right lower extremity ultrasound was positive for DVT  -I personally reviewed interpret EKG which revealed NSR  Patient Reassessment and Ultimate Disposition/Management Overall patient remained clinically well, hemodynamically stable and in no acute distress. Workup revealing for bilateral PEs with evidence of right heart strain and right-sided pulmonary infarct. Initially discussed patient with critical care, Tessie Fass NP, who advised that patient would be appropriate for floor admission on IV heparin.  Admitted to hospital service with Dr. Maisie Fus.   Patient management required discussion with the following services or consulting groups:  Hospitalist Service and Intensivist Service  Complexity of Problems Addressed Acute complicated illness or Injury  Additional Data Reviewed and Analyzed Further history obtained from: Prior ED visit notes and Recent discharge summary  Patient  Encounter Risk Assessment Consideration of hospitalization         Final Clinical Impression(s) / ED Diagnoses Final diagnoses:  Pulmonary embolism,  other, unspecified chronicity, unspecified whether acute cor pulmonale present [facility])    Rx / DC Orders ED Discharge Orders     None         Kishaun, Naves, PA-C 11/11/22 1255    Wynetta Fines, MD 11/11/22 [phone]

## 2022-11-11 NOTE — Progress Notes (Signed)
ANTICOAGULATION CONSULT NOTE - Initial Consult  Pharmacy Consult for Heparin infusion Indication: pulmonary embolus and possible DVT  Allergies  Allergen Reactions   Penicillins Other (See Comments)    Unknown childhood reaction: Did it involve swelling of the face/tongue/throat, SOB, or low BP? Unk Did it involve sudden or severe rash/hives, skin peeling, or any reaction on the inside of your mouth or nose? Unk Did you need to seek medical attention at a hospital or doctor's office? Unk When did it last happen? "I was a child" If all above answers are "NO", may proceed with cephalosporin use.     Patient Measurements: Height: 6\' 3"  (190.5 cm) Weight: 83.9 kg (185 lb) IBW/kg (Calculated) : 84.5 Heparin Dosing Weight: 83.9 kg  Vital Signs: Temp: 98.2 F (36.8 C) (09/16 0716) Temp Source: Oral (09/16 0716) BP: 131/99 (09/16 0830) Pulse Rate: 78 (09/16 0830)  Labs: Recent Labs    11/11/22 0729  HGB 14.4  HCT 43.5  PLT 283  CREATININE 0.88  TROPONINIHS 5    Estimated Creatinine Clearance: 107.3 mL/min (by C-G formula based on SCr of 0.88 mg/dL).   Medical History: Past Medical History:  Diagnosis Date   Chest pain    ETOH abuse    Hypertension    Pulmonary embolism (HCC)    Pulmonary nodules     Medications:  (Not in a hospital admission)   Assessment: 57 yo M presents to ED with chest pain and SOB with pain in R side and pain in his legs with intermittent swelling over the past 2 weeks. Patient stopped taking apixaban ~2 years ago because he didn't want to take it anymore. Pharmacy consulted to dose heparin infusion for treatment of acute PE.   Hgb 14.4, Plt 283 No s/sx of bleeding  Goal of Therapy:  Heparin level 0.3-0.7 units/ml Monitor platelets by anticoagulation protocol: Yes   Plan:  Give heparin 5900 units IV bolus x1, then  Initiate heparin infusion at 1400 units/hr Check heparin level in 6 hours Monitor daily CBC, heparin level, and for  s/sx of bleeding F/u plan to transition to oral [facility] - note history of poor med adherence   Wilburn Cornelia, PharmD, BCPS Clinical Pharmacist 11/11/2022 10:32 AM   Please refer to AMION for pharmacy phone number

## 2022-11-11 NOTE — H&P (Addendum)
History and Physical    [name]. OAC:[id number] DOB: 1965-06-16 DOA: 11/11/2022  PCP: Hoy Register, MD  Patient coming from: home  I have personally briefly reviewed patient's old medical records in [facility]  Chief Complaint: chest pain   HPI: Travis Reed. is a 57 y.o. male with medical history significant of  PE  unprovoked tx with OAC x 57mo, Hypertension, tobacco abuse, daily etoh use, who presents to ED with acute onset of chest /back pain this am that was pleuritic in nature and associated with sob. He also notes associated right leg pain x few days from calf to groin with associated  swelling. ON ros he notes no n/v/cough /fever/chills/ sweats/ presyncope/ abdominal pain , diarrhea, or blood in stools or black stools.   ED Course:  Afeb, bp 132/98, hr 95, r 19 , sat 99%  WBC 9.7, hgb 14.4, MCV72, plt 282 Na 131,K 4.3, CL 96, bicarb 22, cr 0.88 EKG:NSR Ce 5,5 Cxr:Nad  CTPE 1. Bilateral pulmonary emboli, this proximally within the right upper lobar artery. 2. Right ventricle to left ventricle ratio = 1 with flattening of the interventricular septum, which can be seen in the setting of right heart strain. 3. Peripheral ground-glass opacity in the right middle lobe base, suspicious for pulmonary infarct.   RLE doppler Findings consistent with acute deep vein thrombosis involving the right  common femoral vein, SF junction, right femoral vein, right proximal  profunda vein, right popliteal vein, right posterior tibial veins, right  peroneal veins, and partial thrombus  noted into, at least, the EIV and distal CIV.  Findings consistent with acute intramuscular thrombosis involving the  right gastrocnemius veins, and right soleal veins.   Echo:  Left Ventricle: Left ventricular ejection fraction, by estimation, is 70  to 75%. The left ventricle has hyperdynamic function. The left ventricle  has no regional wall motion abnormalities. The left  ventricular internal  cavity size was normal in size.  There is no left ventricular hypertrophy. Left ventricular diastolic  parameters were normal.   Right Ventricle: The right ventricular size is normal. Right ventricular  systolic function is normal.   Left Atrium: Left atrial size was normal in size.   Right Atrium: Right atrial size was normal in size.   Lactic 3.3 1.3 2.3  Tx heparin drip   Review of Systems: As per HPI otherwise 10 point review of systems negative.   Past Medical History:  Diagnosis Date   Chest pain    ETOH abuse    Hypertension    Pulmonary embolism (HCC)    Pulmonary nodules     Past Surgical History:  Procedure Laterality Date   WRIST SURGERY Left      reports that he has been smoking cigarettes. He has never used smokeless tobacco. He reports current alcohol use. He reports current drug use. Drug: Marijuana.  Allergies  Allergen Reactions   Penicillins Other (See Comments)    Unknown childhood reaction: Did it involve swelling of the face/tongue/throat, SOB, or low BP? Unk Did it involve sudden or severe rash/hives, skin peeling, or any reaction on the inside of your mouth or nose? Unk Did you need to seek medical attention at a hospital or doctor's office? Unk When did it last happen? "I was a child" If all above answers are "NO", may proceed with cephalosporin use.     Family History  Problem Relation Age of Onset   Diabetes Mother    Hyperlipidemia  Final (Updated)    VAS [place] LOWER EXTREMITY VENOUS (DVT) (7a-7p)  Result Date: 11/11/2022  Lower Venous DVT Study Patient Name:  Travis Reed.  Date of Exam:   11/11/2022 Medical Rec #: [id number]            Accession #:    [id number] Date of Birth: Feb 17, 1966            Patient Gender: M Patient Age:   30 years Exam Location:  [facility] Procedure:      VAS [place] LOWER EXTREMITY VENOUS (DVT) Referring Phys: Riki Sheer --------------------------------------------------------------------------------  Indications: Pain, and Swelling.  Risk Factors: Remote history. Limitations: Abdominal gas. Comparison Study: No prior study on file Performing Technologist: Sherren Kerns RVS  Examination Guidelines: A complete evaluation includes B-mode imaging, spectral Doppler, color Doppler, and power Doppler as needed of all accessible portions of each vessel. Bilateral testing is considered an integral part of a complete examination. Limited examinations for reoccurring indications may be performed as noted. The reflux portion of the exam is performed with the patient in reverse Trendelenburg.  +----------+---------------+---------+-----------+----------+--------------+ RIGHT     CompressibilityPhasicitySpontaneityPropertiesThrombus Aging +----------+---------------+---------+-----------+----------+--------------+ CFV       None           No       No                   Acute          +----------+---------------+---------+-----------+----------+--------------+ SFJ       None           No       No                   Acute          +----------+---------------+---------+-----------+----------+--------------+ FV Prox   None           No       No                   Acute           +----------+---------------+---------+-----------+----------+--------------+ FV Mid    None           No       No                   Acute          +----------+---------------+---------+-----------+----------+--------------+ FV Distal None           No       No                   Acute          +----------+---------------+---------+-----------+----------+--------------+ PFV       None           No       No                   Acute          +----------+---------------+---------+-----------+----------+--------------+ POP       None           No       No                   Acute          +----------+---------------+---------+-----------+----------+--------------+ PTV       None           No       No  Final (Updated)    VAS [place] LOWER EXTREMITY VENOUS (DVT) (7a-7p)  Result Date: 11/11/2022  Lower Venous DVT Study Patient Name:  Travis Reed.  Date of Exam:   11/11/2022 Medical Rec #: [id number]            Accession #:    [id number] Date of Birth: Feb 17, 1966            Patient Gender: M Patient Age:   30 years Exam Location:  [facility] Procedure:      VAS [place] LOWER EXTREMITY VENOUS (DVT) Referring Phys: Riki Sheer --------------------------------------------------------------------------------  Indications: Pain, and Swelling.  Risk Factors: Remote history. Limitations: Abdominal gas. Comparison Study: No prior study on file Performing Technologist: Sherren Kerns RVS  Examination Guidelines: A complete evaluation includes B-mode imaging, spectral Doppler, color Doppler, and power Doppler as needed of all accessible portions of each vessel. Bilateral testing is considered an integral part of a complete examination. Limited examinations for reoccurring indications may be performed as noted. The reflux portion of the exam is performed with the patient in reverse Trendelenburg.  +----------+---------------+---------+-----------+----------+--------------+ RIGHT     CompressibilityPhasicitySpontaneityPropertiesThrombus Aging +----------+---------------+---------+-----------+----------+--------------+ CFV       None           No       No                   Acute          +----------+---------------+---------+-----------+----------+--------------+ SFJ       None           No       No                   Acute          +----------+---------------+---------+-----------+----------+--------------+ FV Prox   None           No       No                   Acute           +----------+---------------+---------+-----------+----------+--------------+ FV Mid    None           No       No                   Acute          +----------+---------------+---------+-----------+----------+--------------+ FV Distal None           No       No                   Acute          +----------+---------------+---------+-----------+----------+--------------+ PFV       None           No       No                   Acute          +----------+---------------+---------+-----------+----------+--------------+ POP       None           No       No                   Acute          +----------+---------------+---------+-----------+----------+--------------+ PTV       None           No       No  Final (Updated)    VAS [place] LOWER EXTREMITY VENOUS (DVT) (7a-7p)  Result Date: 11/11/2022  Lower Venous DVT Study Patient Name:  Travis Reed.  Date of Exam:   11/11/2022 Medical Rec #: [id number]            Accession #:    [id number] Date of Birth: Feb 17, 1966            Patient Gender: M Patient Age:   30 years Exam Location:  [facility] Procedure:      VAS [place] LOWER EXTREMITY VENOUS (DVT) Referring Phys: Riki Sheer --------------------------------------------------------------------------------  Indications: Pain, and Swelling.  Risk Factors: Remote history. Limitations: Abdominal gas. Comparison Study: No prior study on file Performing Technologist: Sherren Kerns RVS  Examination Guidelines: A complete evaluation includes B-mode imaging, spectral Doppler, color Doppler, and power Doppler as needed of all accessible portions of each vessel. Bilateral testing is considered an integral part of a complete examination. Limited examinations for reoccurring indications may be performed as noted. The reflux portion of the exam is performed with the patient in reverse Trendelenburg.  +----------+---------------+---------+-----------+----------+--------------+ RIGHT     CompressibilityPhasicitySpontaneityPropertiesThrombus Aging +----------+---------------+---------+-----------+----------+--------------+ CFV       None           No       No                   Acute          +----------+---------------+---------+-----------+----------+--------------+ SFJ       None           No       No                   Acute          +----------+---------------+---------+-----------+----------+--------------+ FV Prox   None           No       No                   Acute           +----------+---------------+---------+-----------+----------+--------------+ FV Mid    None           No       No                   Acute          +----------+---------------+---------+-----------+----------+--------------+ FV Distal None           No       No                   Acute          +----------+---------------+---------+-----------+----------+--------------+ PFV       None           No       No                   Acute          +----------+---------------+---------+-----------+----------+--------------+ POP       None           No       No                   Acute          +----------+---------------+---------+-----------+----------+--------------+ PTV       None           No       No  History and Physical    [name]. OAC:[id number] DOB: 1965-06-16 DOA: 11/11/2022  PCP: Hoy Register, MD  Patient coming from: home  I have personally briefly reviewed patient's old medical records in [facility]  Chief Complaint: chest pain   HPI: Travis Reed. is a 57 y.o. male with medical history significant of  PE  unprovoked tx with OAC x 57mo, Hypertension, tobacco abuse, daily etoh use, who presents to ED with acute onset of chest /back pain this am that was pleuritic in nature and associated with sob. He also notes associated right leg pain x few days from calf to groin with associated  swelling. ON ros he notes no n/v/cough /fever/chills/ sweats/ presyncope/ abdominal pain , diarrhea, or blood in stools or black stools.   ED Course:  Afeb, bp 132/98, hr 95, r 19 , sat 99%  WBC 9.7, hgb 14.4, MCV72, plt 282 Na 131,K 4.3, CL 96, bicarb 22, cr 0.88 EKG:NSR Ce 5,5 Cxr:Nad  CTPE 1. Bilateral pulmonary emboli, this proximally within the right upper lobar artery. 2. Right ventricle to left ventricle ratio = 1 with flattening of the interventricular septum, which can be seen in the setting of right heart strain. 3. Peripheral ground-glass opacity in the right middle lobe base, suspicious for pulmonary infarct.   RLE doppler Findings consistent with acute deep vein thrombosis involving the right  common femoral vein, SF junction, right femoral vein, right proximal  profunda vein, right popliteal vein, right posterior tibial veins, right  peroneal veins, and partial thrombus  noted into, at least, the EIV and distal CIV.  Findings consistent with acute intramuscular thrombosis involving the  right gastrocnemius veins, and right soleal veins.   Echo:  Left Ventricle: Left ventricular ejection fraction, by estimation, is 70  to 75%. The left ventricle has hyperdynamic function. The left ventricle  has no regional wall motion abnormalities. The left  ventricular internal  cavity size was normal in size.  There is no left ventricular hypertrophy. Left ventricular diastolic  parameters were normal.   Right Ventricle: The right ventricular size is normal. Right ventricular  systolic function is normal.   Left Atrium: Left atrial size was normal in size.   Right Atrium: Right atrial size was normal in size.   Lactic 3.3 1.3 2.3  Tx heparin drip   Review of Systems: As per HPI otherwise 10 point review of systems negative.   Past Medical History:  Diagnosis Date   Chest pain    ETOH abuse    Hypertension    Pulmonary embolism (HCC)    Pulmonary nodules     Past Surgical History:  Procedure Laterality Date   WRIST SURGERY Left      reports that he has been smoking cigarettes. He has never used smokeless tobacco. He reports current alcohol use. He reports current drug use. Drug: Marijuana.  Allergies  Allergen Reactions   Penicillins Other (See Comments)    Unknown childhood reaction: Did it involve swelling of the face/tongue/throat, SOB, or low BP? Unk Did it involve sudden or severe rash/hives, skin peeling, or any reaction on the inside of your mouth or nose? Unk Did you need to seek medical attention at a hospital or doctor's office? Unk When did it last happen? "I was a child" If all above answers are "NO", may proceed with cephalosporin use.     Family History  Problem Relation Age of Onset   Diabetes Mother    Hyperlipidemia  History and Physical    [name]. OAC:[id number] DOB: 1965-06-16 DOA: 11/11/2022  PCP: Hoy Register, MD  Patient coming from: home  I have personally briefly reviewed patient's old medical records in [facility]  Chief Complaint: chest pain   HPI: Travis Reed. is a 57 y.o. male with medical history significant of  PE  unprovoked tx with OAC x 57mo, Hypertension, tobacco abuse, daily etoh use, who presents to ED with acute onset of chest /back pain this am that was pleuritic in nature and associated with sob. He also notes associated right leg pain x few days from calf to groin with associated  swelling. ON ros he notes no n/v/cough /fever/chills/ sweats/ presyncope/ abdominal pain , diarrhea, or blood in stools or black stools.   ED Course:  Afeb, bp 132/98, hr 95, r 19 , sat 99%  WBC 9.7, hgb 14.4, MCV72, plt 282 Na 131,K 4.3, CL 96, bicarb 22, cr 0.88 EKG:NSR Ce 5,5 Cxr:Nad  CTPE 1. Bilateral pulmonary emboli, this proximally within the right upper lobar artery. 2. Right ventricle to left ventricle ratio = 1 with flattening of the interventricular septum, which can be seen in the setting of right heart strain. 3. Peripheral ground-glass opacity in the right middle lobe base, suspicious for pulmonary infarct.   RLE doppler Findings consistent with acute deep vein thrombosis involving the right  common femoral vein, SF junction, right femoral vein, right proximal  profunda vein, right popliteal vein, right posterior tibial veins, right  peroneal veins, and partial thrombus  noted into, at least, the EIV and distal CIV.  Findings consistent with acute intramuscular thrombosis involving the  right gastrocnemius veins, and right soleal veins.   Echo:  Left Ventricle: Left ventricular ejection fraction, by estimation, is 70  to 75%. The left ventricle has hyperdynamic function. The left ventricle  has no regional wall motion abnormalities. The left  ventricular internal  cavity size was normal in size.  There is no left ventricular hypertrophy. Left ventricular diastolic  parameters were normal.   Right Ventricle: The right ventricular size is normal. Right ventricular  systolic function is normal.   Left Atrium: Left atrial size was normal in size.   Right Atrium: Right atrial size was normal in size.   Lactic 3.3 1.3 2.3  Tx heparin drip   Review of Systems: As per HPI otherwise 10 point review of systems negative.   Past Medical History:  Diagnosis Date   Chest pain    ETOH abuse    Hypertension    Pulmonary embolism (HCC)    Pulmonary nodules     Past Surgical History:  Procedure Laterality Date   WRIST SURGERY Left      reports that he has been smoking cigarettes. He has never used smokeless tobacco. He reports current alcohol use. He reports current drug use. Drug: Marijuana.  Allergies  Allergen Reactions   Penicillins Other (See Comments)    Unknown childhood reaction: Did it involve swelling of the face/tongue/throat, SOB, or low BP? Unk Did it involve sudden or severe rash/hives, skin peeling, or any reaction on the inside of your mouth or nose? Unk Did you need to seek medical attention at a hospital or doctor's office? Unk When did it last happen? "I was a child" If all above answers are "NO", may proceed with cephalosporin use.     Family History  Problem Relation Age of Onset   Diabetes Mother    Hyperlipidemia  Final (Updated)    VAS [place] LOWER EXTREMITY VENOUS (DVT) (7a-7p)  Result Date: 11/11/2022  Lower Venous DVT Study Patient Name:  Travis Reed.  Date of Exam:   11/11/2022 Medical Rec #: [id number]            Accession #:    [id number] Date of Birth: Feb 17, 1966            Patient Gender: M Patient Age:   30 years Exam Location:  [facility] Procedure:      VAS [place] LOWER EXTREMITY VENOUS (DVT) Referring Phys: Riki Sheer --------------------------------------------------------------------------------  Indications: Pain, and Swelling.  Risk Factors: Remote history. Limitations: Abdominal gas. Comparison Study: No prior study on file Performing Technologist: Sherren Kerns RVS  Examination Guidelines: A complete evaluation includes B-mode imaging, spectral Doppler, color Doppler, and power Doppler as needed of all accessible portions of each vessel. Bilateral testing is considered an integral part of a complete examination. Limited examinations for reoccurring indications may be performed as noted. The reflux portion of the exam is performed with the patient in reverse Trendelenburg.  +----------+---------------+---------+-----------+----------+--------------+ RIGHT     CompressibilityPhasicitySpontaneityPropertiesThrombus Aging +----------+---------------+---------+-----------+----------+--------------+ CFV       None           No       No                   Acute          +----------+---------------+---------+-----------+----------+--------------+ SFJ       None           No       No                   Acute          +----------+---------------+---------+-----------+----------+--------------+ FV Prox   None           No       No                   Acute           +----------+---------------+---------+-----------+----------+--------------+ FV Mid    None           No       No                   Acute          +----------+---------------+---------+-----------+----------+--------------+ FV Distal None           No       No                   Acute          +----------+---------------+---------+-----------+----------+--------------+ PFV       None           No       No                   Acute          +----------+---------------+---------+-----------+----------+--------------+ POP       None           No       No                   Acute          +----------+---------------+---------+-----------+----------+--------------+ PTV       None           No       No

## 2022-11-11 NOTE — ED Notes (Addendum)
To x-ray

## 2022-11-11 NOTE — Consult Note (Signed)
NAME:  Travis Munro., MRN:  [id number], DOB:  Aug 14, 1965, LOS: 0 ADMISSION DATE:  11/11/2022, CONSULTATION DATE:  11/11/22 REFERRING MD:  Rodena Medin - EM , CHIEF COMPLAINT:  bilat PE    History of Present Illness:  57 yo M PMH prior unprovoked PE, HTN, smoking, etoh use, syncope   who presented to ED 9/16 w CC R sided chest pain. Associated difficulty breathing, lower extremity burning and swelling RLE x 2 weeks. No syncope, presyncope. Went for CTA chest and found to have bilat PE, RV:LV of 1, and some peripheral GGOs in RML . A hstrop was acquired and was unremarkable at 5.  There is no LA, BNP or echo.   PCCM is consulted for evaluation of PE with CTA read of read of RV LV =1   No recent travel,illness, surgery. No personal hx cancer. No fam hx of known clotting disorders. Mother, Mat grandmother, Mat uncles w various cancer hx (breast, bone, lung, GI) He has not undergone any outpt cancer screenings that he knows of.   In chart review, looks like he had an unprovoked PE in 2020, was Rx eliquis after and stopped taking due to gum bleeding and possibly GIB  Pertinent  Medical History  PE HTN Tocacco use Etoh use   Significant Hospital Events: Including procedures, antibiotic start and stop dates in addition to other pertinent events   9/16 bilat PE. PCCM consult    Interim History / Subjective:  Consulted   Objective   Blood pressure (!) 131/99, pulse 78, temperature 98.2 F (36.8 C), temperature source Oral, resp. rate 14, height 6\' 3"  (1.905 m), weight 83.9 kg, SpO2 100%.       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   -PESI- low risk  P -admit to [facility], rec hep gtt  -I have ordered a BNP and LA, as well as ECHO  -lower extremity US is pending  -will likely need lifelong AC  -will need close f/u for this given hx of bleeding SE  -consider hypercoag workup   -tobacco cessation support  -needs outpt follow up for cancer screenings -PRN analgesia for pulm infarct    Best Practice (right click and "Reselect all SmartList Selections" daily)  Per primary   Labs   CBC: Recent Labs  Lab 11/11/22 0729  WBC 9.7  HGB 14.4  HCT 43.5  MCV 72.0*  PLT 283    Basic Metabolic Panel: Recent Labs  Lab 11/11/22 0729  NA 131*  K 4.3  CL 96*  CO2 22  GLUCOSE 90  BUN 5*  CREATININE 0.88  CALCIUM 8.9   GFR: Estimated Creatinine Clearance: 107.3 mL/min (by C-G formula based on SCr of 0.88 mg/dL). Recent Labs  Lab 11/11/22 0729  WBC 9.7    Liver Function Tests: No results for input(s): "AST", "ALT", "ALKPHOS", "BILITOT", "PROT", "ALBUMIN" in the last 168 hours. No results for input(s): "LIPASE", "AMYLASE" in  the last 168 hours. No results for input(s): "AMMONIA" in the last 168 hours.  ABG    Component Value Date/Time   HCO3 28.0 06/15/2018 0631   TCO2 30 06/15/2018 0631   O2SAT 52.0 06/15/2018 0631     Coagulation Profile: No results for input(s): "INR", "PROTIME" in the last 168 hours.  Cardiac Enzymes: No results for input(s): "CKTOTAL", "CKMB", "CKMBINDEX", "TROPONINI" in the last 168 hours.  HbA1C: No results found for: "HGBA1C"  CBG: No results for input(s): "GLUCAP" in the last 168 hours.  Review of Systems:   Review of Systems  Constitutional: Negative.   HENT: Negative.    Eyes: Negative.   Respiratory: Negative.    Cardiovascular:  Positive for chest pain and leg swelling.  Gastrointestinal: Negative.   Genitourinary: Negative.    Musculoskeletal: Negative.   Skin: Negative.   Neurological: Negative.   Endo/Heme/Allergies: Negative.   Psychiatric/Behavioral: Negative.       Past Medical History:  He,  has a past medical history of Chest pain, ETOH abuse, Hypertension, Pulmonary embolism (HCC), and Pulmonary nodules.   Surgical History:   Past Surgical History:  Procedure Laterality Date   WRIST SURGERY Left      Social History:   reports that he has been smoking cigarettes. He has never used smokeless tobacco. He reports current alcohol use. He reports current drug use. Drug: Marijuana.   Family History:  His family history includes CAD in his father; Diabetes in his mother; Hyperlipidemia in his mother.   Allergies Allergies  Allergen Reactions   Penicillins Other (See Comments)    Unknown childhood reaction: Did it involve swelling of the face/tongue/throat, SOB, or low BP? Unk Did it involve sudden or severe rash/hives, skin peeling, or any reaction on the inside of your mouth or nose? Unk Did you need to seek medical attention at a hospital or doctor's office? Unk When did it last happen? "I was a child" If all above answers are "NO", may proceed with cephalosporin use.      Home Medications  Prior to Admission medications   Medication Sig Start Date End Date Taking? Authorizing Provider  apixaban (ELIQUIS) 5 MG TABS tablet Take 1 tablet (5 mg total) by mouth 2 (two) times daily. Patient not taking: Reported on 07/20/2020 04/23/19   Hoy Register, MD  chlordiazePOXIDE (LIBRIUM) 25 MG capsule 50mg  PO TID x 1D, then 25-50mg  PO BID X 1D, then 25-50mg  PO QD X 1D Patient not taking: No sig reported 04/06/19   Mannie Stabile, PA-C  cyclobenzaprine (FLEXERIL) 10 MG tablet Take 1 tablet (10 mg total) by mouth 2 (two) times daily as needed for up to 20 doses for muscle spasms. Patient not taking: Reported on 07/20/2020 10/17/19   Virgina Norfolk, DO  diclofenac Sodium (VOLTAREN) 1 % GEL Apply 4 g  topically 4 (four) times daily. Patient not taking: Reported on 07/20/2020 04/22/19   Hoy Register, MD  diltiazem (CARDIZEM CD) 120 MG 24 hr capsule Take 1 capsule (120 mg total) by mouth daily. Patient not taking: Reported on 07/20/2020 04/23/19   Hoy Register, MD  famotidine (PEPCID) 20 MG tablet Take 1 tablet (20 mg total) by mouth 2 (two) times daily. For stomach protection Patient not taking: No sig reported 09/18/18   Fulp, Cammie, MD  nicotine (NICODERM CQ - DOSED IN MG/24 HR) 7 mg/24hr patch Place 1 patch (7 mg total) onto the skin daily. Patient not taking: No sig reported 07/21/18   Hoy Register, MD  traMADol (ULTRAM) 50 MG tablet Take 1 tablet (50 mg total) by mouth every 12 (twelve) hours as needed for moderate pain. Patient not taking: No sig reported 09/18/18   Cain Saupe, MD     Critical care time: na     Tessie Fass MSN, AGACNP-BC [facility] Pulmonary/Critical Care Medicine Amion for pager  11/11/2022, 11:28 AM

## 2022-11-11 NOTE — Progress Notes (Signed)
ANTICOAGULATION CONSULT NOTE - Follow Up Consult  Pharmacy Consult for Heparin infusion Indication: pulmonary embolus and possible DVT  Allergies  Allergen Reactions   Penicillins Other (See Comments)    Unknown childhood reaction: Did it involve swelling of the face/tongue/throat, SOB, or low BP? Unk Did it involve sudden or severe rash/hives, skin peeling, or any reaction on the inside of your mouth or nose? Unk Did you need to seek medical attention at a hospital or doctor's office? Unk When did it last happen? "I was a child" If all above answers are "NO", may proceed with cephalosporin use.     Patient Measurements: Height: 6\' 3"  (190.5 cm) Weight: 83.9 kg (185 lb) IBW/kg (Calculated) : 84.5 Heparin Dosing Weight: 83.9 kg  Vital Signs: Temp: 98 F (36.7 C) (09/16 1951) Temp Source: Oral (09/16 1951) BP: 129/93 (09/16 2000) Pulse Rate: 77 (09/16 2000)  Labs: Recent Labs    11/11/22 0729 11/11/22 1106 11/11/22 1545 11/11/22 1945  HGB 14.4  --   --   --   HCT 43.5  --   --   --   PLT 283  --   --   --   HEPARINUNFRC  --   --   --  0.60  CREATININE 0.88  --  0.94  --   TROPONINIHS 5 5 6   --     Estimated Creatinine Clearance: 100.4 mL/min (by C-G formula based on SCr of 0.94 mg/dL).   Medical History: Past Medical History:  Diagnosis Date   Chest pain    ETOH abuse    Hypertension    Pulmonary embolism (HCC)    Pulmonary nodules     Medications:  No medications prior to admission.    Assessment: 57 yo M presents to ED with chest pain and SOB with pain in R side and pain in his legs with intermittent swelling over the past 2 weeks. Patient stopped taking apixaban ~2 years ago because he didn't want to take it anymore. Pharmacy consulted to dose heparin infusion for treatment of acute PE.   Hgb 14.4, Plt 283 No s/sx of bleeding  PM: heparin level 0.6 (therapeutic) on heparin 1400 units/hr. No bleeding or issues with the infusion reported per  RN.  Goal of Therapy:  Heparin level 0.3-0.7 units/ml Monitor platelets by anticoagulation protocol: Yes   Plan:  Continue heparin infusion at 1400 units/hr Check confirmatory heparin level with AM labs Monitor daily CBC, heparin level, and for s/sx of bleeding F/u plan to transition to oral [facility] - note history of poor med adherence   Loralee Pacas, PharmD, BCPS 11/11/2022 8:52 PM   Please check AMION for all [facility] Pharm[facility] phone numbers After 10:00 PM, call Main Pharmacy [phone]   Please refer to [facility] for pharmacy phone number

## 2022-11-11 NOTE — ED Notes (Signed)
75.0 in Accession #:    [id number]          Weight:       185.0 lb Date of Birth:  08/21/1965           BSA:          2.123 m Patient Age:    57 years            BP:           131/99 mmHg Patient Gender: M                   HR:           75 bpm. Exam Location:  Inpatient Procedure: 2D Echo, Cardiac Doppler and Color Doppler                             MODIFIED REPORT: This report was modified by Olga Millers MD on 11/11/2022 due to change.  Indications:     Pulm Embolism  History:         Patient has prior history of Echocardiogram examinations.                  Signs/Symptoms:Syncope; Risk Factors:Current Smoker and                  Hypertension.  Sonographer:     Dondra Prader RVT Referring Phys:  [id number] GRACE E BOWSER Diagnosing Phys: Olga Millers MD  Sonographer Comments: Probable thrombus in the IVC IMPRESSIONS  1. IVC not well visualized; cannot R/O thrombus.  2. Left ventricular ejection fraction, by estimation, is 70 to 75%. The left ventricle has hyperdynamic function. The left ventricle has no regional wall motion abnormalities. Left ventricular diastolic parameters were normal.  3. Right ventricular systolic function is normal. The right ventricular size is normal.  4. The mitral valve is normal in structure. No evidence of mitral valve regurgitation. No evidence of mitral stenosis.  5. The aortic valve is tricuspid. Aortic valve regurgitation is not visualized. Aortic valve sclerosis/calcification is present, without any evidence of aortic stenosis.  6. The inferior vena cava is normal in size with  greater than 50% respiratory variability, suggesting right atrial pressure of 3 mmHg. Comparison(s): No significant change from prior study. FINDINGS  Left Ventricle: Left ventricular ejection fraction, by estimation, is 70 to 75%. The left ventricle has hyperdynamic function. The left ventricle has no regional wall motion abnormalities. The left ventricular internal cavity size was normal in size. There is no left ventricular hypertrophy. Left ventricular diastolic parameters were normal. Right Ventricle: The right ventricular size is normal. Right ventricular systolic function is normal. Left Atrium: Left atrial size was normal in size. Right Atrium: Right atrial size was normal in size. Pericardium: There is no evidence of pericardial effusion. Mitral Valve: The mitral valve is normal in structure. No evidence of mitral valve regurgitation. No evidence of mitral valve stenosis. Tricuspid Valve: The tricuspid valve is normal in structure. Tricuspid valve regurgitation is trivial. No evidence of tricuspid stenosis. Aortic Valve: The aortic valve is tricuspid. Aortic valve regurgitation is not visualized. Aortic valve sclerosis/calcification is present, without any evidence of aortic stenosis. Aortic valve mean gradient measures 4.5 mmHg. Aortic valve peak gradient measures 8.4 mmHg. Aortic valve area, by VTI measures 2.83 cm. Pulmonic Valve: The pulmonic valve was not well visualized. Pulmonic valve regurgitation is not visualized. No evidence of pulmonic stenosis. Aorta: The aortic root  is normal in size and structure. Venous: The inferior vena cava is normal in size with greater than 50% respiratory variability, suggesting right atrial pressure of 3 mmHg. IAS/Shunts: No atrial level shunt detected by color flow Doppler. Additional Comments: IVC not well visualized; cannot R/O thrombus.  LEFT VENTRICLE PLAX 2D LVIDd:         4.30 cm   Diastology LVIDs:         2.70 cm   LV e' medial:    6.42 cm/s LV PW:          0.70 cm   LV E/e' medial:  10.1 LV IVS:        0.80 cm   LV e' lateral:   9.68 cm/s LVOT diam:     2.10 cm   LV E/e' lateral: 6.7 LV SV:         74 LV SV Index:   35 LVOT Area:     3.46 cm  RIGHT VENTRICLE             IVC RV S prime:     20.60 cm/s  IVC diam: 0.90 cm TAPSE (M-mode): 2.2 cm LEFT ATRIUM             Index       RIGHT ATRIUM           Index LA diam:        3.00 cm 1.41 cm/m  RA Area:     12.90 cm LA Vol (A2C):   18.2 ml 8.57 ml/m  RA Volume:   29.25 ml  13.78 ml/m LA Vol (A4C):   19.6 ml 9.23 ml/m LA Biplane Vol: 20.2 ml 9.52 ml/m  AORTIC VALVE                    PULMONIC VALVE AV Area (Vmax):    2.95 cm     PV Vmax:       0.78 m/s AV Area (Vmean):   2.77 cm     PV Peak grad:  2.4 mmHg AV Area (VTI):     2.83 cm AV Vmax:           144.50 cm/s AV Vmean:          97.200 cm/s AV VTI:            0.262 m AV Peak Grad:      8.4 mmHg AV Mean Grad:      4.5 mmHg LVOT Vmax:         123.00 cm/s LVOT Vmean:        77.800 cm/s LVOT VTI:          0.214 m LVOT/AV VTI ratio: 0.82  AORTA Ao Root diam: 3.20 cm MITRAL VALVE MV Area (PHT): 3.72 cm    SHUNTS MV Decel Time: 204 msec    Systemic VTI:  0.21 m MV E velocity: 65.10 cm/s  Systemic Diam: 2.10 cm MV A velocity: 55.80 cm/s MV E/A ratio:  1.17 Olga Millers MD Electronically signed by Olga Millers MD Signature Date/Time: 11/11/2022/12:49:19 PM    Final (Updated)    VAS [place] LOWER EXTREMITY VENOUS (DVT) (7a-7p)  Result Date: 11/11/2022  Lower Venous DVT Study Patient Name:  Travis Reed.  Date of Exam:   11/11/2022 Medical Rec #: [id number]            Accession #:    [id number] Date of Birth: 1965/04/19  is normal in size and structure. Venous: The inferior vena cava is normal in size with greater than 50% respiratory variability, suggesting right atrial pressure of 3 mmHg. IAS/Shunts: No atrial level shunt detected by color flow Doppler. Additional Comments: IVC not well visualized; cannot R/O thrombus.  LEFT VENTRICLE PLAX 2D LVIDd:         4.30 cm   Diastology LVIDs:         2.70 cm   LV e' medial:    6.42 cm/s LV PW:          0.70 cm   LV E/e' medial:  10.1 LV IVS:        0.80 cm   LV e' lateral:   9.68 cm/s LVOT diam:     2.10 cm   LV E/e' lateral: 6.7 LV SV:         74 LV SV Index:   35 LVOT Area:     3.46 cm  RIGHT VENTRICLE             IVC RV S prime:     20.60 cm/s  IVC diam: 0.90 cm TAPSE (M-mode): 2.2 cm LEFT ATRIUM             Index       RIGHT ATRIUM           Index LA diam:        3.00 cm 1.41 cm/m  RA Area:     12.90 cm LA Vol (A2C):   18.2 ml 8.57 ml/m  RA Volume:   29.25 ml  13.78 ml/m LA Vol (A4C):   19.6 ml 9.23 ml/m LA Biplane Vol: 20.2 ml 9.52 ml/m  AORTIC VALVE                    PULMONIC VALVE AV Area (Vmax):    2.95 cm     PV Vmax:       0.78 m/s AV Area (Vmean):   2.77 cm     PV Peak grad:  2.4 mmHg AV Area (VTI):     2.83 cm AV Vmax:           144.50 cm/s AV Vmean:          97.200 cm/s AV VTI:            0.262 m AV Peak Grad:      8.4 mmHg AV Mean Grad:      4.5 mmHg LVOT Vmax:         123.00 cm/s LVOT Vmean:        77.800 cm/s LVOT VTI:          0.214 m LVOT/AV VTI ratio: 0.82  AORTA Ao Root diam: 3.20 cm MITRAL VALVE MV Area (PHT): 3.72 cm    SHUNTS MV Decel Time: 204 msec    Systemic VTI:  0.21 m MV E velocity: 65.10 cm/s  Systemic Diam: 2.10 cm MV A velocity: 55.80 cm/s MV E/A ratio:  1.17 Olga Millers MD Electronically signed by Olga Millers MD Signature Date/Time: 11/11/2022/12:49:19 PM    Final (Updated)    VAS [place] LOWER EXTREMITY VENOUS (DVT) (7a-7p)  Result Date: 11/11/2022  Lower Venous DVT Study Patient Name:  Travis Reed.  Date of Exam:   11/11/2022 Medical Rec #: [id number]            Accession #:    [id number] Date of Birth: 1965/04/19  75.0 in Accession #:    [id number]          Weight:       185.0 lb Date of Birth:  08/21/1965           BSA:          2.123 m Patient Age:    57 years            BP:           131/99 mmHg Patient Gender: M                   HR:           75 bpm. Exam Location:  Inpatient Procedure: 2D Echo, Cardiac Doppler and Color Doppler                             MODIFIED REPORT: This report was modified by Olga Millers MD on 11/11/2022 due to change.  Indications:     Pulm Embolism  History:         Patient has prior history of Echocardiogram examinations.                  Signs/Symptoms:Syncope; Risk Factors:Current Smoker and                  Hypertension.  Sonographer:     Dondra Prader RVT Referring Phys:  [id number] GRACE E BOWSER Diagnosing Phys: Olga Millers MD  Sonographer Comments: Probable thrombus in the IVC IMPRESSIONS  1. IVC not well visualized; cannot R/O thrombus.  2. Left ventricular ejection fraction, by estimation, is 70 to 75%. The left ventricle has hyperdynamic function. The left ventricle has no regional wall motion abnormalities. Left ventricular diastolic parameters were normal.  3. Right ventricular systolic function is normal. The right ventricular size is normal.  4. The mitral valve is normal in structure. No evidence of mitral valve regurgitation. No evidence of mitral stenosis.  5. The aortic valve is tricuspid. Aortic valve regurgitation is not visualized. Aortic valve sclerosis/calcification is present, without any evidence of aortic stenosis.  6. The inferior vena cava is normal in size with  greater than 50% respiratory variability, suggesting right atrial pressure of 3 mmHg. Comparison(s): No significant change from prior study. FINDINGS  Left Ventricle: Left ventricular ejection fraction, by estimation, is 70 to 75%. The left ventricle has hyperdynamic function. The left ventricle has no regional wall motion abnormalities. The left ventricular internal cavity size was normal in size. There is no left ventricular hypertrophy. Left ventricular diastolic parameters were normal. Right Ventricle: The right ventricular size is normal. Right ventricular systolic function is normal. Left Atrium: Left atrial size was normal in size. Right Atrium: Right atrial size was normal in size. Pericardium: There is no evidence of pericardial effusion. Mitral Valve: The mitral valve is normal in structure. No evidence of mitral valve regurgitation. No evidence of mitral valve stenosis. Tricuspid Valve: The tricuspid valve is normal in structure. Tricuspid valve regurgitation is trivial. No evidence of tricuspid stenosis. Aortic Valve: The aortic valve is tricuspid. Aortic valve regurgitation is not visualized. Aortic valve sclerosis/calcification is present, without any evidence of aortic stenosis. Aortic valve mean gradient measures 4.5 mmHg. Aortic valve peak gradient measures 8.4 mmHg. Aortic valve area, by VTI measures 2.83 cm. Pulmonic Valve: The pulmonic valve was not well visualized. Pulmonic valve regurgitation is not visualized. No evidence of pulmonic stenosis. Aorta: The aortic root  is normal in size and structure. Venous: The inferior vena cava is normal in size with greater than 50% respiratory variability, suggesting right atrial pressure of 3 mmHg. IAS/Shunts: No atrial level shunt detected by color flow Doppler. Additional Comments: IVC not well visualized; cannot R/O thrombus.  LEFT VENTRICLE PLAX 2D LVIDd:         4.30 cm   Diastology LVIDs:         2.70 cm   LV e' medial:    6.42 cm/s LV PW:          0.70 cm   LV E/e' medial:  10.1 LV IVS:        0.80 cm   LV e' lateral:   9.68 cm/s LVOT diam:     2.10 cm   LV E/e' lateral: 6.7 LV SV:         74 LV SV Index:   35 LVOT Area:     3.46 cm  RIGHT VENTRICLE             IVC RV S prime:     20.60 cm/s  IVC diam: 0.90 cm TAPSE (M-mode): 2.2 cm LEFT ATRIUM             Index       RIGHT ATRIUM           Index LA diam:        3.00 cm 1.41 cm/m  RA Area:     12.90 cm LA Vol (A2C):   18.2 ml 8.57 ml/m  RA Volume:   29.25 ml  13.78 ml/m LA Vol (A4C):   19.6 ml 9.23 ml/m LA Biplane Vol: 20.2 ml 9.52 ml/m  AORTIC VALVE                    PULMONIC VALVE AV Area (Vmax):    2.95 cm     PV Vmax:       0.78 m/s AV Area (Vmean):   2.77 cm     PV Peak grad:  2.4 mmHg AV Area (VTI):     2.83 cm AV Vmax:           144.50 cm/s AV Vmean:          97.200 cm/s AV VTI:            0.262 m AV Peak Grad:      8.4 mmHg AV Mean Grad:      4.5 mmHg LVOT Vmax:         123.00 cm/s LVOT Vmean:        77.800 cm/s LVOT VTI:          0.214 m LVOT/AV VTI ratio: 0.82  AORTA Ao Root diam: 3.20 cm MITRAL VALVE MV Area (PHT): 3.72 cm    SHUNTS MV Decel Time: 204 msec    Systemic VTI:  0.21 m MV E velocity: 65.10 cm/s  Systemic Diam: 2.10 cm MV A velocity: 55.80 cm/s MV E/A ratio:  1.17 Olga Millers MD Electronically signed by Olga Millers MD Signature Date/Time: 11/11/2022/12:49:19 PM    Final (Updated)    VAS [place] LOWER EXTREMITY VENOUS (DVT) (7a-7p)  Result Date: 11/11/2022  Lower Venous DVT Study Patient Name:  Travis Reed.  Date of Exam:   11/11/2022 Medical Rec #: [id number]            Accession #:    [id number] Date of Birth: 1965/04/19  is normal in size and structure. Venous: The inferior vena cava is normal in size with greater than 50% respiratory variability, suggesting right atrial pressure of 3 mmHg. IAS/Shunts: No atrial level shunt detected by color flow Doppler. Additional Comments: IVC not well visualized; cannot R/O thrombus.  LEFT VENTRICLE PLAX 2D LVIDd:         4.30 cm   Diastology LVIDs:         2.70 cm   LV e' medial:    6.42 cm/s LV PW:          0.70 cm   LV E/e' medial:  10.1 LV IVS:        0.80 cm   LV e' lateral:   9.68 cm/s LVOT diam:     2.10 cm   LV E/e' lateral: 6.7 LV SV:         74 LV SV Index:   35 LVOT Area:     3.46 cm  RIGHT VENTRICLE             IVC RV S prime:     20.60 cm/s  IVC diam: 0.90 cm TAPSE (M-mode): 2.2 cm LEFT ATRIUM             Index       RIGHT ATRIUM           Index LA diam:        3.00 cm 1.41 cm/m  RA Area:     12.90 cm LA Vol (A2C):   18.2 ml 8.57 ml/m  RA Volume:   29.25 ml  13.78 ml/m LA Vol (A4C):   19.6 ml 9.23 ml/m LA Biplane Vol: 20.2 ml 9.52 ml/m  AORTIC VALVE                    PULMONIC VALVE AV Area (Vmax):    2.95 cm     PV Vmax:       0.78 m/s AV Area (Vmean):   2.77 cm     PV Peak grad:  2.4 mmHg AV Area (VTI):     2.83 cm AV Vmax:           144.50 cm/s AV Vmean:          97.200 cm/s AV VTI:            0.262 m AV Peak Grad:      8.4 mmHg AV Mean Grad:      4.5 mmHg LVOT Vmax:         123.00 cm/s LVOT Vmean:        77.800 cm/s LVOT VTI:          0.214 m LVOT/AV VTI ratio: 0.82  AORTA Ao Root diam: 3.20 cm MITRAL VALVE MV Area (PHT): 3.72 cm    SHUNTS MV Decel Time: 204 msec    Systemic VTI:  0.21 m MV E velocity: 65.10 cm/s  Systemic Diam: 2.10 cm MV A velocity: 55.80 cm/s MV E/A ratio:  1.17 Olga Millers MD Electronically signed by Olga Millers MD Signature Date/Time: 11/11/2022/12:49:19 PM    Final (Updated)    VAS [place] LOWER EXTREMITY VENOUS (DVT) (7a-7p)  Result Date: 11/11/2022  Lower Venous DVT Study Patient Name:  Travis Reed.  Date of Exam:   11/11/2022 Medical Rec #: [id number]            Accession #:    [id number] Date of Birth: 1965/04/19  75.0 in Accession #:    [id number]          Weight:       185.0 lb Date of Birth:  08/21/1965           BSA:          2.123 m Patient Age:    57 years            BP:           131/99 mmHg Patient Gender: M                   HR:           75 bpm. Exam Location:  Inpatient Procedure: 2D Echo, Cardiac Doppler and Color Doppler                             MODIFIED REPORT: This report was modified by Olga Millers MD on 11/11/2022 due to change.  Indications:     Pulm Embolism  History:         Patient has prior history of Echocardiogram examinations.                  Signs/Symptoms:Syncope; Risk Factors:Current Smoker and                  Hypertension.  Sonographer:     Dondra Prader RVT Referring Phys:  [id number] GRACE E BOWSER Diagnosing Phys: Olga Millers MD  Sonographer Comments: Probable thrombus in the IVC IMPRESSIONS  1. IVC not well visualized; cannot R/O thrombus.  2. Left ventricular ejection fraction, by estimation, is 70 to 75%. The left ventricle has hyperdynamic function. The left ventricle has no regional wall motion abnormalities. Left ventricular diastolic parameters were normal.  3. Right ventricular systolic function is normal. The right ventricular size is normal.  4. The mitral valve is normal in structure. No evidence of mitral valve regurgitation. No evidence of mitral stenosis.  5. The aortic valve is tricuspid. Aortic valve regurgitation is not visualized. Aortic valve sclerosis/calcification is present, without any evidence of aortic stenosis.  6. The inferior vena cava is normal in size with  greater than 50% respiratory variability, suggesting right atrial pressure of 3 mmHg. Comparison(s): No significant change from prior study. FINDINGS  Left Ventricle: Left ventricular ejection fraction, by estimation, is 70 to 75%. The left ventricle has hyperdynamic function. The left ventricle has no regional wall motion abnormalities. The left ventricular internal cavity size was normal in size. There is no left ventricular hypertrophy. Left ventricular diastolic parameters were normal. Right Ventricle: The right ventricular size is normal. Right ventricular systolic function is normal. Left Atrium: Left atrial size was normal in size. Right Atrium: Right atrial size was normal in size. Pericardium: There is no evidence of pericardial effusion. Mitral Valve: The mitral valve is normal in structure. No evidence of mitral valve regurgitation. No evidence of mitral valve stenosis. Tricuspid Valve: The tricuspid valve is normal in structure. Tricuspid valve regurgitation is trivial. No evidence of tricuspid stenosis. Aortic Valve: The aortic valve is tricuspid. Aortic valve regurgitation is not visualized. Aortic valve sclerosis/calcification is present, without any evidence of aortic stenosis. Aortic valve mean gradient measures 4.5 mmHg. Aortic valve peak gradient measures 8.4 mmHg. Aortic valve area, by VTI measures 2.83 cm. Pulmonic Valve: The pulmonic valve was not well visualized. Pulmonic valve regurgitation is not visualized. No evidence of pulmonic stenosis. Aorta: The aortic root

## 2022-11-11 NOTE — ED Notes (Signed)
Called Vascular [facility] to check on patient status, as I did not know where he was. They advised lower leg [place] has been completed, and he is checked in and undergoing echo currently. Delay in labs and heparin due to patient not in ED. Provider made aware.

## 2022-11-11 NOTE — ED Triage Notes (Signed)
Pt came in via POV d/t Rt sided CP that began this morning making it hard to breathe, also reports the right side of his body hurts as well, his leg feels like its burning & it has been intermittently swelling the last 2 weeks. A/Ox4, rates his pain 10/10 while in triage & states last time this happened he had a blood clot. Does take Eliquis.

## 2022-11-12 DIAGNOSIS — I2699 Other pulmonary embolism without acute cor pulmonale: Secondary | ICD-10-CM | POA: Diagnosis not present

## 2022-11-12 LAB — CBC
HCT: 39 % (ref 39.0–52.0)
Hemoglobin: 12.5 g/dL — ABNORMAL LOW (ref 13.0–17.0)
MCH: 23.2 pg — ABNORMAL LOW (ref 26.0–34.0)
MCHC: 32.1 g/dL (ref 30.0–36.0)
MCV: 72.4 fL — ABNORMAL LOW (ref 80.0–100.0)
Platelets: 274 10*3/uL (ref 150–400)
RBC: 5.39 MIL/uL (ref 4.22–5.81)
RDW: 16.9 % — ABNORMAL HIGH (ref 11.5–15.5)
WBC: 6.9 10*3/uL (ref 4.0–10.5)
nRBC: 0 % (ref 0.0–0.2)

## 2022-11-12 LAB — BASIC METABOLIC PANEL
Anion gap: 10 (ref 5–15)
BUN: 7 mg/dL (ref 6–20)
CO2: 26 mmol/L (ref 22–32)
Calcium: 8.7 mg/dL — ABNORMAL LOW (ref 8.9–10.3)
Chloride: 97 mmol/L — ABNORMAL LOW (ref 98–111)
Creatinine, Ser: 1.04 mg/dL (ref 0.61–1.24)
GFR, Estimated: >60 mL/min (ref >60)
Glucose, Bld: 94 mg/dL (ref 70–99)
Potassium: 4.2 mmol/L (ref 3.5–5.1)
Sodium: 133 mmol/L — ABNORMAL LOW (ref 135–145)

## 2022-11-12 LAB — HEPARIN LEVEL (UNFRACTIONATED)
Heparin Unfractionated: 0.7 [IU]/mL (ref 0.30–0.70)
Heparin Unfractionated: 0.45 [IU]/mL (ref 0.30–0.70)
Heparin Unfractionated: 0.35 [IU]/mL (ref 0.30–0.70)

## 2022-11-12 MED ORDER — NICOTINE 14 MG/24HR TD PT24
14.0000 mg | MEDICATED_PATCH | Freq: Every day | TRANSDERMAL | Status: DC
  Administered 2022-11-12 – 2022-11-14 (×3): 14 mg via TRANSDERMAL
  Filled 2022-11-12 (×3): qty 1

## 2022-11-12 NOTE — Plan of Care (Signed)

## 2022-11-12 NOTE — Progress Notes (Signed)
ANTICOAGULATION CONSULT NOTE - Follow Up Consult  Pharmacy Consult for Heparin infusion Indication: pulmonary embolus and possible DVT  Allergies  Allergen Reactions   Penicillins Other (See Comments)    Unknown childhood reaction: Did it involve swelling of the face/tongue/throat, SOB, or low BP? Unk Did it involve sudden or severe rash/hives, skin peeling, or any reaction on the inside of your mouth or nose? Unk Did you need to seek medical attention at a hospital or doctor's office? Unk When did it last happen? "I was a child" If all above answers are "NO", may proceed with cephalosporin use.     Patient Measurements: Height: 6\' 3"  (190.5 cm) Weight: 83.9 kg (185 lb) IBW/kg (Calculated) : 84.5 Heparin Dosing Weight: 83.9 kg  Vital Signs: Temp: 97.9 F (36.6 C) (09/17 1100) Temp Source: Oral (09/17 1100) BP: 127/94 (09/17 1100) Pulse Rate: 66 (09/17 1100)  Labs: Recent Labs    11/11/22 0729 11/11/22 1106 11/11/22 1545 11/11/22 1945 11/12/22 0241 11/12/22 1152  HGB 14.4  --   --   --  12.5*  --   HCT 43.5  --   --   --  39.0  --   PLT 283  --   --   --  274  --   HEPARINUNFRC  --   --   --  0.60 0.70 0.45  CREATININE 0.88  --  0.94  --  1.04  --   TROPONINIHS 5 5 6 5   --   --     Estimated Creatinine Clearance: 90.8 mL/min (by C-G formula based on SCr of 1.04 mg/dL).   Medical History: Past Medical History:  Diagnosis Date   Chest pain    ETOH abuse    Hypertension    Pulmonary embolism (HCC)    Pulmonary nodules     Medications:  No medications prior to admission.    Assessment: 57 yo M presents to ED with chest pain and SOB with pain in R side and pain in his legs with intermittent swelling over the past 2 weeks. Patient stopped taking apixaban ~2 years ago because he didn't want to take it anymore. Pharmacy consulted to dose heparin infusion for treatment of acute PE. CTA showing PE and DVT US + for acute DVT in several different veins.   Heparin  level therapeutic at 0.45. HgB 12.5 and PLTs 274.   Goal of Therapy:  Heparin level 0.3-0.7 units/ml Monitor platelets by anticoagulation protocol: Yes   Plan:  Continue 1350u/hr.  Recheck anti-Xa in 6 hours.  Monitor daily CBC, heparin level, and for s/sx of bleeding F/u plan to transition to oral [facility] - note history of poor med adherence  Estill Batten, PharmD, BCCCP  Clinical Pharmacist 11/12/2022 1:01 PM

## 2022-11-12 NOTE — Hospital Course (Signed)
57 y.o. male with medical history significant of  PE  unprovoked tx with OAC x 55mo, Hypertension, tobacco abuse, daily etoh use, who presented to ED with acute onset of chest /back pain, shortness of breath on 11/11/22 . He also had associated right leg pain x few days from calf to groin with associated  swelling. In the ED vitals stable not hypoxic Labs: WBC 9.7, hgb 14.4, MCV72, plt 282 Na 131,K 4.3, CL 96, bicarb 22, cr 0.88. EKG:NSR .  CTPE>>Bilateral pulmonary emboli, this proximally within the right upper lobar artery. Right ventricle to left ventricle ratio = 1 with flattening of the interventricular septum, which can be seen in the setting of right heart strain.Peripheral ground-glass opacity in the right middle lobe base, suspicious for pulmonary infarct.  RLE doppler>>acute dvt right common femoral vein, SF junction, right femoral vein, right proximal  profunda vein, right popliteal vein, right posterior tibial veins, right  peroneal veins, and partial thrombus  noted into, at least, the EIV and distal CIV.  And acute intramuscular thrombosis involving the  right gastrocnemius veins, and right soleal veins.  Echo>> lvef 70-75%,hyperdynamic function.LV NRWMA. Right Ventricle size is normal. Right ventricular  systolic function is normal.  Lactic 3.3> 1.3> 2.3  Patient was started on heparin drip and admitted for further management after consulting with critical care

## 2022-11-12 NOTE — Progress Notes (Signed)
ANTICOAGULATION CONSULT NOTE - Follow Up Consult  Pharmacy Consult for Heparin infusion Indication: pulmonary embolus and possible DVT  Allergies  Allergen Reactions   Penicillins Other (See Comments)    Unknown childhood reaction: Did it involve swelling of the face/tongue/throat, SOB, or low BP? Unk Did it involve sudden or severe rash/hives, skin peeling, or any reaction on the inside of your mouth or nose? Unk Did you need to seek medical attention at a hospital or doctor's office? Unk When did it last happen? "I was a child" If all above answers are "NO", may proceed with cephalosporin use.     Patient Measurements: Height: 6\' 3"  (190.5 cm) Weight: 83.9 kg (185 lb) IBW/kg (Calculated) : 84.5 Heparin Dosing Weight: 83.9 kg  Vital Signs: Temp: 97.9 F (36.6 C) (09/17 0300) Temp Source: Oral (09/17 0300) BP: 137/88 (09/17 0300) Pulse Rate: 67 (09/17 0300)  Labs: Recent Labs    11/11/22 0729 11/11/22 1106 11/11/22 1545 11/11/22 1945 11/12/22 0241  HGB 14.4  --   --   --  12.5*  HCT 43.5  --   --   --  39.0  PLT 283  --   --   --  274  HEPARINUNFRC  --   --   --  0.60 0.70  CREATININE 0.88  --  0.94  --  1.04  TROPONINIHS 5 5 6 5   --     Estimated Creatinine Clearance: 90.8 mL/min (by C-G formula based on SCr of 1.04 mg/dL).   Medical History: Past Medical History:  Diagnosis Date   Chest pain    ETOH abuse    Hypertension    Pulmonary embolism (HCC)    Pulmonary nodules     Medications:  No medications prior to admission.    Assessment: 57 yo M presents to ED with chest pain and SOB with pain in R side and pain in his legs with intermittent swelling over the past 2 weeks. Patient stopped taking apixaban ~2 years ago because he didn't want to take it anymore. Pharmacy consulted to dose heparin infusion for treatment of acute PE.   Hgb 14.4, Plt 283 No s/sx of bleeding  9/17 AM: heparin level 0.7 on heparin 1400 units/hr (therapeutic). Per RN, no  issues with the heparin infusion or signs/symptoms of bleeding. Hgb 14>12.5, plts WNL  Goal of Therapy:  Heparin level 0.3-0.7 units/ml Monitor platelets by anticoagulation protocol: Yes   Plan:  Decrease heparin infusion to 1350 units/hr to maintain goal range Monitor daily CBC, heparin level, and for s/sx of bleeding F/u plan to transition to oral [facility] - note history of poor med adherence  Arabella Merles, PharmD. Clinical Pharmacist 11/12/2022 3:17 AM

## 2022-11-12 NOTE — Progress Notes (Signed)
PROGRESS NOTE Travis Reed [employer].  ZOX:[id number] DOB: Mar 23, 1965 DOA: 11/11/2022 PCP: Hoy Register, MD  Brief Narrative/Hospital Course: 57 y.o. male with medical history significant of  PE  unprovoked tx with OAC x 4mo, Hypertension, tobacco abuse, daily etoh use, who presented to ED with acute onset of chest /back pain, shortness of breath on 11/11/22 . He also had associated right leg pain x few days from calf to groin with associated  swelling. In the ED vitals stable not hypoxic Labs: WBC 9.7, hgb 14.4, MCV72, plt 282 Na 131,K 4.3, CL 96, bicarb 22, cr 0.88. EKG:NSR .  CTPE>>Bilateral pulmonary emboli, this proximally within the right upper lobar artery. Right ventricle to left ventricle ratio = 1 with flattening of the interventricular septum, which can be seen in the setting of right heart strain.Peripheral ground-glass opacity in the right middle lobe base, suspicious for pulmonary infarct.  RLE doppler>>acute dvt right common femoral vein, SF junction, right femoral vein, right proximal  profunda vein, right popliteal vein, right posterior tibial veins, right  peroneal veins, and partial thrombus  noted into, at least, the EIV and distal CIV.  And acute intramuscular thrombosis involving the  right gastrocnemius veins, and right soleal veins.  Echo>> lvef 70-75%,hyperdynamic function.LV NRWMA. Right Ventricle size is normal. Right ventricular  systolic function is normal.  Lactic 3.3> 1.3> 2.3  Patient was started on heparin drip and admitted for further management after consulting with critical care    Subjective: Patient seen and examined this morning Resting on room air complaints of chest pain on the right side on deep breath Overnight hemodynamically stable on room air BP 120s to 130s Labs reviewed mild hyponatremia hemoglobin slightly low at 12.5 g   Assessment and Plan: Principal Problem:   PE (pulmonary thromboembolism) (HCC) Active Problems:   Pulmonary infarct (HCC)    Bilateral pulmonary embolism (HCC)   Acute deep vein thrombosis (DVT) of proximal vein of right lower extremity (HCC)   Unprovoked bilateral PE Right lower extremity DVT History of unprovoked PE 2 years ago treated with Eliquis 33-month: [facility] consulted in ED>per recommendation continue on heparin drip, not area of pulmonary infarct, continue supportive care pain management.  Currently vitals are stable not hypoxic but he has pleuritic pain right leg pain, Serial Hs trops negative and BNP normal.  He will need lifelong anticoagulation> will transition to DOAC at least after 48 hours after iv  heparin.  Patient never had a colonoscopy it seems I discussed with him need for age-appropriate cancer screening including colonoscopy in the light of his unprovoked DVT/PE, and he will discuss with his PCP.  Tobacco dependence: Cessation advised and dded nicotine patch  Hypertension: BP remains stable.  Continue to hold medication  Alcohol abuse: Placed on CIWA scale, monitor, continue thiamine and vitamins and folate  Mild hyponatremia: Monitor  Lactic acidosis: Likely FROM PE and DVT, vital stable  DVT prophylaxis:  Code Status:   Code Status: Full Code Family Communication: plan of care discussed with patient at bedside. Patient status is:  inpatient because of bilateral PE Level of care: Progressive   Dispo: The patient is from: home            Anticipated disposition: home In 1-2 days  Objective: Vitals last 24 hrs: Vitals:   11/11/22 2300 11/12/22 0200 11/12/22 0300 11/12/22 0800  BP: 117/83 (!) 113/92 137/88 (!) 127/96  Pulse: 76 66 67 69  Resp: 16  20 14   Temp: 97.8 F (36.6 C)  Acute          +----------+---------------+---------+-----------+----------+--------------+ PTV       None           No       No                   Acute          +----------+---------------+---------+-----------+----------+--------------+ PERO      None           No       No                   Acute          +----------+---------------+---------+-----------+----------+--------------+ Soleal    None           No       No                   Acute          +----------+---------------+---------+-----------+----------+--------------+ Gastroc   None           No       No                   Acute          +----------+---------------+---------+-----------+----------+--------------+ EIV                       Yes      Yes                  Acute          +----------+---------------+---------+-----------+----------+--------------+ CIV distal               Yes      Yes                  Acute          +----------+---------------+---------+-----------+----------+--------------+ Partial acute thrombus noted in the external iliac and distal common iliac veins Unable to visualize higher secondary to abdominal gas.  +----+---------------+---------+-----------+----------+--------------+ LEFTCompressibilityPhasicitySpontaneityPropertiesThrombus Aging +----+---------------+---------+-----------+----------+--------------+ CFV Full           Yes      Yes                                 +----+---------------+---------+-----------+----------+--------------+     Summary: RIGHT: - Findings consistent with acute deep vein thrombosis involving the right common femoral vein, SF junction, right femoral vein, right proximal profunda vein, right popliteal vein, right posterior tibial veins, right peroneal veins, and partial thrombus noted into, at least, the EIV and distal CIV. Findings consistent with acute intramuscular thrombosis involving the right gastrocnemius veins, and right soleal veins. - No cystic structure found in the popliteal fossa.  LEFT: - No evidence of common femoral vein obstruction.   *See table(s) above for measurements and observations. Electronically signed by Lemar Livings MD on 11/11/2022 at 4:45:44 PM.    Final    ECHOCARDIOGRAM COMPLETE  Result Date: 11/11/2022    ECHOCARDIOGRAM REPORT   Patient Name:   Travis Reed. Date of Exam: 11/11/2022 Medical Rec #:  [id number]           Height:       75.0 in Accession #:    [id number]          Weight:       185.0 lb Date of Birth:  01-May-1965  Acute          +----------+---------------+---------+-----------+----------+--------------+ PTV       None           No       No                   Acute          +----------+---------------+---------+-----------+----------+--------------+ PERO      None           No       No                   Acute          +----------+---------------+---------+-----------+----------+--------------+ Soleal    None           No       No                   Acute          +----------+---------------+---------+-----------+----------+--------------+ Gastroc   None           No       No                   Acute          +----------+---------------+---------+-----------+----------+--------------+ EIV                       Yes      Yes                  Acute          +----------+---------------+---------+-----------+----------+--------------+ CIV distal               Yes      Yes                  Acute          +----------+---------------+---------+-----------+----------+--------------+ Partial acute thrombus noted in the external iliac and distal common iliac veins Unable to visualize higher secondary to abdominal gas.  +----+---------------+---------+-----------+----------+--------------+ LEFTCompressibilityPhasicitySpontaneityPropertiesThrombus Aging +----+---------------+---------+-----------+----------+--------------+ CFV Full           Yes      Yes                                 +----+---------------+---------+-----------+----------+--------------+     Summary: RIGHT: - Findings consistent with acute deep vein thrombosis involving the right common femoral vein, SF junction, right femoral vein, right proximal profunda vein, right popliteal vein, right posterior tibial veins, right peroneal veins, and partial thrombus noted into, at least, the EIV and distal CIV. Findings consistent with acute intramuscular thrombosis involving the right gastrocnemius veins, and right soleal veins. - No cystic structure found in the popliteal fossa.  LEFT: - No evidence of common femoral vein obstruction.   *See table(s) above for measurements and observations. Electronically signed by Lemar Livings MD on 11/11/2022 at 4:45:44 PM.    Final    ECHOCARDIOGRAM COMPLETE  Result Date: 11/11/2022    ECHOCARDIOGRAM REPORT   Patient Name:   Travis Reed. Date of Exam: 11/11/2022 Medical Rec #:  [id number]           Height:       75.0 in Accession #:    [id number]          Weight:       185.0 lb Date of Birth:  01-May-1965  PROGRESS NOTE Travis Reed [employer].  ZOX:[id number] DOB: Mar 23, 1965 DOA: 11/11/2022 PCP: Hoy Register, MD  Brief Narrative/Hospital Course: 57 y.o. male with medical history significant of  PE  unprovoked tx with OAC x 4mo, Hypertension, tobacco abuse, daily etoh use, who presented to ED with acute onset of chest /back pain, shortness of breath on 11/11/22 . He also had associated right leg pain x few days from calf to groin with associated  swelling. In the ED vitals stable not hypoxic Labs: WBC 9.7, hgb 14.4, MCV72, plt 282 Na 131,K 4.3, CL 96, bicarb 22, cr 0.88. EKG:NSR .  CTPE>>Bilateral pulmonary emboli, this proximally within the right upper lobar artery. Right ventricle to left ventricle ratio = 1 with flattening of the interventricular septum, which can be seen in the setting of right heart strain.Peripheral ground-glass opacity in the right middle lobe base, suspicious for pulmonary infarct.  RLE doppler>>acute dvt right common femoral vein, SF junction, right femoral vein, right proximal  profunda vein, right popliteal vein, right posterior tibial veins, right  peroneal veins, and partial thrombus  noted into, at least, the EIV and distal CIV.  And acute intramuscular thrombosis involving the  right gastrocnemius veins, and right soleal veins.  Echo>> lvef 70-75%,hyperdynamic function.LV NRWMA. Right Ventricle size is normal. Right ventricular  systolic function is normal.  Lactic 3.3> 1.3> 2.3  Patient was started on heparin drip and admitted for further management after consulting with critical care    Subjective: Patient seen and examined this morning Resting on room air complaints of chest pain on the right side on deep breath Overnight hemodynamically stable on room air BP 120s to 130s Labs reviewed mild hyponatremia hemoglobin slightly low at 12.5 g   Assessment and Plan: Principal Problem:   PE (pulmonary thromboembolism) (HCC) Active Problems:   Pulmonary infarct (HCC)    Bilateral pulmonary embolism (HCC)   Acute deep vein thrombosis (DVT) of proximal vein of right lower extremity (HCC)   Unprovoked bilateral PE Right lower extremity DVT History of unprovoked PE 2 years ago treated with Eliquis 33-month: [facility] consulted in ED>per recommendation continue on heparin drip, not area of pulmonary infarct, continue supportive care pain management.  Currently vitals are stable not hypoxic but he has pleuritic pain right leg pain, Serial Hs trops negative and BNP normal.  He will need lifelong anticoagulation> will transition to DOAC at least after 48 hours after iv  heparin.  Patient never had a colonoscopy it seems I discussed with him need for age-appropriate cancer screening including colonoscopy in the light of his unprovoked DVT/PE, and he will discuss with his PCP.  Tobacco dependence: Cessation advised and dded nicotine patch  Hypertension: BP remains stable.  Continue to hold medication  Alcohol abuse: Placed on CIWA scale, monitor, continue thiamine and vitamins and folate  Mild hyponatremia: Monitor  Lactic acidosis: Likely FROM PE and DVT, vital stable  DVT prophylaxis:  Code Status:   Code Status: Full Code Family Communication: plan of care discussed with patient at bedside. Patient status is:  inpatient because of bilateral PE Level of care: Progressive   Dispo: The patient is from: home            Anticipated disposition: home In 1-2 days  Objective: Vitals last 24 hrs: Vitals:   11/11/22 2300 11/12/22 0200 11/12/22 0300 11/12/22 0800  BP: 117/83 (!) 113/92 137/88 (!) 127/96  Pulse: 76 66 67 69  Resp: 16  20 14   Temp: 97.8 F (36.6 C)  PROGRESS NOTE Travis Reed [employer].  ZOX:[id number] DOB: Mar 23, 1965 DOA: 11/11/2022 PCP: Hoy Register, MD  Brief Narrative/Hospital Course: 57 y.o. male with medical history significant of  PE  unprovoked tx with OAC x 4mo, Hypertension, tobacco abuse, daily etoh use, who presented to ED with acute onset of chest /back pain, shortness of breath on 11/11/22 . He also had associated right leg pain x few days from calf to groin with associated  swelling. In the ED vitals stable not hypoxic Labs: WBC 9.7, hgb 14.4, MCV72, plt 282 Na 131,K 4.3, CL 96, bicarb 22, cr 0.88. EKG:NSR .  CTPE>>Bilateral pulmonary emboli, this proximally within the right upper lobar artery. Right ventricle to left ventricle ratio = 1 with flattening of the interventricular septum, which can be seen in the setting of right heart strain.Peripheral ground-glass opacity in the right middle lobe base, suspicious for pulmonary infarct.  RLE doppler>>acute dvt right common femoral vein, SF junction, right femoral vein, right proximal  profunda vein, right popliteal vein, right posterior tibial veins, right  peroneal veins, and partial thrombus  noted into, at least, the EIV and distal CIV.  And acute intramuscular thrombosis involving the  right gastrocnemius veins, and right soleal veins.  Echo>> lvef 70-75%,hyperdynamic function.LV NRWMA. Right Ventricle size is normal. Right ventricular  systolic function is normal.  Lactic 3.3> 1.3> 2.3  Patient was started on heparin drip and admitted for further management after consulting with critical care    Subjective: Patient seen and examined this morning Resting on room air complaints of chest pain on the right side on deep breath Overnight hemodynamically stable on room air BP 120s to 130s Labs reviewed mild hyponatremia hemoglobin slightly low at 12.5 g   Assessment and Plan: Principal Problem:   PE (pulmonary thromboembolism) (HCC) Active Problems:   Pulmonary infarct (HCC)    Bilateral pulmonary embolism (HCC)   Acute deep vein thrombosis (DVT) of proximal vein of right lower extremity (HCC)   Unprovoked bilateral PE Right lower extremity DVT History of unprovoked PE 2 years ago treated with Eliquis 33-month: [facility] consulted in ED>per recommendation continue on heparin drip, not area of pulmonary infarct, continue supportive care pain management.  Currently vitals are stable not hypoxic but he has pleuritic pain right leg pain, Serial Hs trops negative and BNP normal.  He will need lifelong anticoagulation> will transition to DOAC at least after 48 hours after iv  heparin.  Patient never had a colonoscopy it seems I discussed with him need for age-appropriate cancer screening including colonoscopy in the light of his unprovoked DVT/PE, and he will discuss with his PCP.  Tobacco dependence: Cessation advised and dded nicotine patch  Hypertension: BP remains stable.  Continue to hold medication  Alcohol abuse: Placed on CIWA scale, monitor, continue thiamine and vitamins and folate  Mild hyponatremia: Monitor  Lactic acidosis: Likely FROM PE and DVT, vital stable  DVT prophylaxis:  Code Status:   Code Status: Full Code Family Communication: plan of care discussed with patient at bedside. Patient status is:  inpatient because of bilateral PE Level of care: Progressive   Dispo: The patient is from: home            Anticipated disposition: home In 1-2 days  Objective: Vitals last 24 hrs: Vitals:   11/11/22 2300 11/12/22 0200 11/12/22 0300 11/12/22 0800  BP: 117/83 (!) 113/92 137/88 (!) 127/96  Pulse: 76 66 67 69  Resp: 16  20 14   Temp: 97.8 F (36.6 C)  Acute          +----------+---------------+---------+-----------+----------+--------------+ PTV       None           No       No                   Acute          +----------+---------------+---------+-----------+----------+--------------+ PERO      None           No       No                   Acute          +----------+---------------+---------+-----------+----------+--------------+ Soleal    None           No       No                   Acute          +----------+---------------+---------+-----------+----------+--------------+ Gastroc   None           No       No                   Acute          +----------+---------------+---------+-----------+----------+--------------+ EIV                       Yes      Yes                  Acute          +----------+---------------+---------+-----------+----------+--------------+ CIV distal               Yes      Yes                  Acute          +----------+---------------+---------+-----------+----------+--------------+ Partial acute thrombus noted in the external iliac and distal common iliac veins Unable to visualize higher secondary to abdominal gas.  +----+---------------+---------+-----------+----------+--------------+ LEFTCompressibilityPhasicitySpontaneityPropertiesThrombus Aging +----+---------------+---------+-----------+----------+--------------+ CFV Full           Yes      Yes                                 +----+---------------+---------+-----------+----------+--------------+     Summary: RIGHT: - Findings consistent with acute deep vein thrombosis involving the right common femoral vein, SF junction, right femoral vein, right proximal profunda vein, right popliteal vein, right posterior tibial veins, right peroneal veins, and partial thrombus noted into, at least, the EIV and distal CIV. Findings consistent with acute intramuscular thrombosis involving the right gastrocnemius veins, and right soleal veins. - No cystic structure found in the popliteal fossa.  LEFT: - No evidence of common femoral vein obstruction.   *See table(s) above for measurements and observations. Electronically signed by Lemar Livings MD on 11/11/2022 at 4:45:44 PM.    Final    ECHOCARDIOGRAM COMPLETE  Result Date: 11/11/2022    ECHOCARDIOGRAM REPORT   Patient Name:   Travis Reed. Date of Exam: 11/11/2022 Medical Rec #:  [id number]           Height:       75.0 in Accession #:    [id number]          Weight:       185.0 lb Date of Birth:  01-May-1965  PROGRESS NOTE Travis Reed [employer].  ZOX:[id number] DOB: Mar 23, 1965 DOA: 11/11/2022 PCP: Hoy Register, MD  Brief Narrative/Hospital Course: 57 y.o. male with medical history significant of  PE  unprovoked tx with OAC x 4mo, Hypertension, tobacco abuse, daily etoh use, who presented to ED with acute onset of chest /back pain, shortness of breath on 11/11/22 . He also had associated right leg pain x few days from calf to groin with associated  swelling. In the ED vitals stable not hypoxic Labs: WBC 9.7, hgb 14.4, MCV72, plt 282 Na 131,K 4.3, CL 96, bicarb 22, cr 0.88. EKG:NSR .  CTPE>>Bilateral pulmonary emboli, this proximally within the right upper lobar artery. Right ventricle to left ventricle ratio = 1 with flattening of the interventricular septum, which can be seen in the setting of right heart strain.Peripheral ground-glass opacity in the right middle lobe base, suspicious for pulmonary infarct.  RLE doppler>>acute dvt right common femoral vein, SF junction, right femoral vein, right proximal  profunda vein, right popliteal vein, right posterior tibial veins, right  peroneal veins, and partial thrombus  noted into, at least, the EIV and distal CIV.  And acute intramuscular thrombosis involving the  right gastrocnemius veins, and right soleal veins.  Echo>> lvef 70-75%,hyperdynamic function.LV NRWMA. Right Ventricle size is normal. Right ventricular  systolic function is normal.  Lactic 3.3> 1.3> 2.3  Patient was started on heparin drip and admitted for further management after consulting with critical care    Subjective: Patient seen and examined this morning Resting on room air complaints of chest pain on the right side on deep breath Overnight hemodynamically stable on room air BP 120s to 130s Labs reviewed mild hyponatremia hemoglobin slightly low at 12.5 g   Assessment and Plan: Principal Problem:   PE (pulmonary thromboembolism) (HCC) Active Problems:   Pulmonary infarct (HCC)    Bilateral pulmonary embolism (HCC)   Acute deep vein thrombosis (DVT) of proximal vein of right lower extremity (HCC)   Unprovoked bilateral PE Right lower extremity DVT History of unprovoked PE 2 years ago treated with Eliquis 33-month: [facility] consulted in ED>per recommendation continue on heparin drip, not area of pulmonary infarct, continue supportive care pain management.  Currently vitals are stable not hypoxic but he has pleuritic pain right leg pain, Serial Hs trops negative and BNP normal.  He will need lifelong anticoagulation> will transition to DOAC at least after 48 hours after iv  heparin.  Patient never had a colonoscopy it seems I discussed with him need for age-appropriate cancer screening including colonoscopy in the light of his unprovoked DVT/PE, and he will discuss with his PCP.  Tobacco dependence: Cessation advised and dded nicotine patch  Hypertension: BP remains stable.  Continue to hold medication  Alcohol abuse: Placed on CIWA scale, monitor, continue thiamine and vitamins and folate  Mild hyponatremia: Monitor  Lactic acidosis: Likely FROM PE and DVT, vital stable  DVT prophylaxis:  Code Status:   Code Status: Full Code Family Communication: plan of care discussed with patient at bedside. Patient status is:  inpatient because of bilateral PE Level of care: Progressive   Dispo: The patient is from: home            Anticipated disposition: home In 1-2 days  Objective: Vitals last 24 hrs: Vitals:   11/11/22 2300 11/12/22 0200 11/12/22 0300 11/12/22 0800  BP: 117/83 (!) 113/92 137/88 (!) 127/96  Pulse: 76 66 67 69  Resp: 16  20 14   Temp: 97.8 F (36.6 C)

## 2022-11-13 ENCOUNTER — Other Ambulatory Visit (HOSPITAL_COMMUNITY): Payer: Self-pay

## 2022-11-13 DIAGNOSIS — I2699 Other pulmonary embolism without acute cor pulmonale: Secondary | ICD-10-CM | POA: Diagnosis not present

## 2022-11-13 MED ORDER — APIXABAN 5 MG PO TABS
10.0000 mg | ORAL_TABLET | Freq: Two times a day (BID) | ORAL | Status: DC
  Administered 2022-11-13 – 2022-11-14 (×3): 10 mg via ORAL
  Filled 2022-11-13 (×3): qty 2

## 2022-11-13 MED ORDER — HYDROCODONE-ACETAMINOPHEN 5-325 MG PO TABS
1.0000 | ORAL_TABLET | Freq: Four times a day (QID) | ORAL | Status: DC | PRN
  Administered 2022-11-13: 1 via ORAL
  Filled 2022-11-13: qty 1

## 2022-11-13 MED ORDER — HYDROCODONE-ACETAMINOPHEN 5-325 MG PO TABS
1.0000 | ORAL_TABLET | Freq: Four times a day (QID) | ORAL | Status: DC | PRN
  Administered 2022-11-13 – 2022-11-14 (×3): 2 via ORAL
  Filled 2022-11-13 (×3): qty 2

## 2022-11-13 MED ORDER — APIXABAN 5 MG PO TABS: 5.0000 mg | ORAL_TABLET | Freq: Two times a day (BID) | ORAL | Status: DC

## 2022-11-13 NOTE — Plan of Care (Signed)

## 2022-11-13 NOTE — TOC Benefit Eligibility Note (Signed)
Patient Product/process development scientist completed.    The patient is insured through [employer] and [employer].     Ran test claim for Eliquis 5 mg and the current 30 day co-pay is $4.00.   This test claim was processed through [facility]- copay amounts may vary at other pharmacies due to pharmacy/plan contracts, or as the patient moves through the different stages of their insurance plan.     Roland Earl, CPHT Pharmacy Technician III Certified Patient Advocate [facility] Pharmacy Patient Advocate Team Direct Number: [phone]  Fax: [phone]

## 2022-11-13 NOTE — Progress Notes (Addendum)
ANTICOAGULATION CONSULT NOTE - Follow Up Consult  Pharmacy Consult for Heparin infusion Indication: pulmonary embolus and possible DVT  Allergies  Allergen Reactions   Penicillins Other (See Comments)    Unknown childhood reaction: Did it involve swelling of the face/tongue/throat, SOB, or low BP? Unk Did it involve sudden or severe rash/hives, skin peeling, or any reaction on the inside of your mouth or nose? Unk Did you need to seek medical attention at a hospital or doctor's office? Unk When did it last happen? "I was a child" If all above answers are "NO", may proceed with cephalosporin use.     Patient Measurements: Height: 6\' 3"  (190.5 cm) Weight: 83.9 kg (185 lb) IBW/kg (Calculated) : 84.5 Heparin Dosing Weight: 83.9 kg  Vital Signs: Temp: 98.8 F (37.1 C) (09/18 0259) Temp Source: Oral (09/18 0259) BP: 130/88 (09/18 0259) Pulse Rate: 80 (09/18 0259)  Labs: Recent Labs    11/11/22 0729 11/11/22 1106 11/11/22 1545 11/11/22 1945 11/11/22 1945 11/12/22 0241 11/12/22 1152 11/12/22 1952 11/13/22 0325  HGB 14.4  --   --   --   --  12.5*  --   --  12.2*  HCT 43.5  --   --   --   --  39.0  --   --  38.4*  PLT 283  --   --   --   --  274  --   --  337  HEPARINUNFRC  --   --   --  0.60   < > 0.70 0.45 0.35 0.37  CREATININE 0.88  --  0.94  --   --  1.04  --   --   --   TROPONINIHS 5 5 6 5   --   --   --   --   --    < > = values in this interval not displayed.    Estimated Creatinine Clearance: 90.8 mL/min (by C-G formula based on SCr of 1.04 mg/dL).   Medical History: Past Medical History:  Diagnosis Date   Chest pain    ETOH abuse    Hypertension    Pulmonary embolism (HCC)    Pulmonary nodules     Medications:  No medications prior to admission.    Assessment: 57 yo M presents to ED with chest pain and SOB with pain in R side and pain in his legs with intermittent swelling over the past 2 weeks. Patient stopped taking apixaban ~2 years ago because he  didn't want to take it anymore. Pharmacy consulted to dose heparin infusion for treatment of acute PE. CTA showing PE and DVT US + for acute DVT in several different veins.   Heparin level therapeutic at 0.37. HgB 12.2 and PLTs 337. Patient on room air, no issues noted.   Goal of Therapy:  Heparin level 0.3-0.7 units/ml Monitor platelets by anticoagulation protocol: Yes   Plan:  Continue 1350u/hr.  Monitor daily CBC, heparin level, and for s/sx of bleeding F/u plan to transition to oral [facility] - note history of poor med adherence  Estill Batten, PharmD, BCCCP  Clinical Pharmacist 11/13/2022 8:30 AM   ADDENDUM:   Consult to start Eliquis. Will start with Eliquis 10mg  BID x 7 days then transition to 5mg  BID.   Estill Batten, PharmD, BCCCP

## 2022-11-13 NOTE — Progress Notes (Signed)
PROGRESS NOTE    Travis Reed [employer].  WUJ:[id number] DOB: [birth year] DOA: 11/11/2022 PCP: Hoy Register, MD   Brief Narrative: 57 year old with past medical history significant for PE unprovoked treated for 6 months, hypertension, tobacco abuse, daily EtOH presents with acute onset of chest back pain shortness of breath on 9/16.  Associated with right leg pain.  CT showed bilateral PE some concern for right heart strain by CT, Doppler right lower extremity positive for acute DVT right common femoral vein, SF junction, right femoral vein, right proximal profunda vein, right popliteal vein right posterior tibial vein, right peroneal pain and partial thrombus noted in the ED IV and distal CIV.  Patient admitted with recurrent PE and lower extremity DVT   Assessment & Plan:   Principal Problem:   PE (pulmonary thromboembolism) (HCC) Active Problems:   Pulmonary infarct (HCC)   Bilateral pulmonary embolism (HCC)   Acute deep vein thrombosis (DVT) of proximal vein of right lower extremity (HCC)  1-Unprovoked bilateral PE Right lower extremity DVT -Bilateral history of PE 2 years ago treated with Eliquis for 37-month -Evaluated by  CCM have recommended anticoagulation -He received heparin drip for 48 hours, plan to transition to Eliquis 2D echo with normal right ventricular function Patient will need appropriate screening for age for malignancy Vitals stable, saturation 97 on room air  Tobacco dependence: Counseling  Hypertension: Continue to hold blood pressure medication  Alcohol  abuse: Monitor on CIWA  Mild hyponatremia; monitor.  Lactic acidosis: In the setting of PE        Estimated body mass index is 23.12 kg/m as calculated from the following:   Height as of this encounter: 6\' 3"  (1.905 m).   Weight as of this encounter: 83.9 kg.   DVT prophylaxis: Eliquis Code Status: Full code Family Communication:care discussed with patient.  Disposition Plan:  Status is:  Inpatient Remains inpatient appropriate because: management of PE    Consultants:  CCM  Procedures:  ECHO doppler  Antimicrobials:    Subjective: To have chest pain, pleuritic chest pain. Discussed about initiation of Eliquis, he agreed to proceed.  He did report some gum bleeding from Eliquis and hematuria in the past  Objective: Vitals:   11/12/22 1500 11/12/22 1913 11/12/22 2310 11/13/22 0259  BP: (!) 125/96 (!) 141/97 (!) 139/96 130/88  Pulse: 69 71 81 80  Resp: 16 19 17 16   Temp: 98 F (36.7 C) 98.8 F (37.1 C) 98.5 F (36.9 C) 98.8 F (37.1 C)  TempSrc: Oral Oral Oral Oral  SpO2: 95% 97% 97% 97%  Weight:      Height:        Intake/Output Summary (Last 24 hours) at 11/13/2022 1057 Last data filed at 11/12/2022 2311 Gross per 24 hour  Intake 240 ml  Output 725 ml  Net -485 ml   Filed Weights   11/11/22 0716  Weight: 83.9 kg    Examination:  General exam: Appears calm and comfortable  Respiratory system: Clear to auscultation. Respiratory effort normal. Cardiovascular system: S1 & S2 heard, RRR. No JVD, murmurs, rubs, gallops or clicks. No pedal edema. Gastrointestinal system: Abdomen is nondistended, soft and nontender. No organomegaly or masses felt. Normal bowel sounds heard. Central nervous system: Alert and oriented.  Extremities: Symmetric 5 x 5 power.     Data Reviewed: I have personally reviewed following labs and imaging studies  CBC: Recent Labs  Lab 11/11/22 0729 11/12/22 0241 11/13/22 0325  WBC 9.7 6.9 7.2  HGB 14.4 12.5*  12.2*  HCT 43.5 39.0 38.4*  MCV 72.0* 72.4* 72.5*  PLT 283 274 337   Basic Metabolic Panel: Recent Labs  Lab 11/11/22 0729 11/11/22 1545 11/12/22 0241  NA 131* 132* 133*  K 4.3 4.0 4.2  CL 96* 95* 97*  CO2 22 21* 26  GLUCOSE 90 140* 94  BUN 5* 6 7  CREATININE 0.88 0.94 1.04  CALCIUM 8.9 8.6* 8.7*  MG  --  2.0  --    GFR: Estimated Creatinine Clearance: 90.8 mL/min (by C-G formula based on SCr of  1.04 mg/dL). Liver Function Tests: No results for input(s): "AST", "ALT", "ALKPHOS", "BILITOT", "PROT", "ALBUMIN" in the last 168 hours. No results for input(s): "LIPASE", "AMYLASE" in the last 168 hours. No results for input(s): "AMMONIA" in the last 168 hours. Coagulation Profile: No results for input(s): "INR", "PROTIME" in the last 168 hours. Cardiac Enzymes: No results for input(s): "CKTOTAL", "CKMB", "CKMBINDEX", "TROPONINI" in the last 168 hours. BNP (last 3 results) No results for input(s): "PROBNP" in the last 8760 hours. HbA1C: Recent Labs    11/11/22 1945  HGBA1C 4.7*   CBG: No results for input(s): "GLUCAP" in the last 168 hours. Lipid Profile: No results for input(s): "CHOL", "HDL", "LDLCALC", "TRIG", "CHOLHDL", "LDLDIRECT" in the last 72 hours. Thyroid Function Tests: Recent Labs    11/11/22 1546  TSH 1.986   Anemia Panel: No results for input(s): "VITAMINB12", "FOLATE", "FERRITIN", "TIBC", "IRON", "RETICCTPCT" in the last 72 hours. Sepsis Labs: Recent Labs  Lab 11/11/22 1106  LATICACIDVEN 2.3*    No results found for this or any previous visit (from the past 240 hour(s)).       Radiology Studies: ECHOCARDIOGRAM COMPLETE  Result Date: 11/11/2022    ECHOCARDIOGRAM REPORT   Patient Name:   Markavious Hettinger. Date of Exam: 11/11/2022 Medical Rec #:  [id number]           Height:       75.0 in Accession #:    [id number]          Weight:       185.0 lb Date of Birth:  [birth year]           BSA:          2.123 m Patient Age:    57 years            BP:           131/99 mmHg Patient Gender: M                   HR:           75 bpm. Exam Location:  Inpatient Procedure: 2D Echo, Cardiac Doppler and Color Doppler                             MODIFIED REPORT: This report was modified by Olga Millers MD on 11/11/2022 due to change.  Indications:     Pulm Embolism  History:         Patient has prior history of Echocardiogram examinations.                   Signs/Symptoms:Syncope; Risk Factors:Current Smoker and                  Hypertension.  Sonographer:     Dondra Prader RVT Referring Phys:  [id number] GRACE E BOWSER Diagnosing Phys: Olga Millers MD  Sonographer Comments: Probable thrombus  PROGRESS NOTE    Travis Reed [employer].  WUJ:[id number] DOB: [birth year] DOA: 11/11/2022 PCP: Hoy Register, MD   Brief Narrative: 57 year old with past medical history significant for PE unprovoked treated for 6 months, hypertension, tobacco abuse, daily EtOH presents with acute onset of chest back pain shortness of breath on 9/16.  Associated with right leg pain.  CT showed bilateral PE some concern for right heart strain by CT, Doppler right lower extremity positive for acute DVT right common femoral vein, SF junction, right femoral vein, right proximal profunda vein, right popliteal vein right posterior tibial vein, right peroneal pain and partial thrombus noted in the ED IV and distal CIV.  Patient admitted with recurrent PE and lower extremity DVT   Assessment & Plan:   Principal Problem:   PE (pulmonary thromboembolism) (HCC) Active Problems:   Pulmonary infarct (HCC)   Bilateral pulmonary embolism (HCC)   Acute deep vein thrombosis (DVT) of proximal vein of right lower extremity (HCC)  1-Unprovoked bilateral PE Right lower extremity DVT -Bilateral history of PE 2 years ago treated with Eliquis for 37-month -Evaluated by  CCM have recommended anticoagulation -He received heparin drip for 48 hours, plan to transition to Eliquis 2D echo with normal right ventricular function Patient will need appropriate screening for age for malignancy Vitals stable, saturation 97 on room air  Tobacco dependence: Counseling  Hypertension: Continue to hold blood pressure medication  Alcohol  abuse: Monitor on CIWA  Mild hyponatremia; monitor.  Lactic acidosis: In the setting of PE        Estimated body mass index is 23.12 kg/m as calculated from the following:   Height as of this encounter: 6\' 3"  (1.905 m).   Weight as of this encounter: 83.9 kg.   DVT prophylaxis: Eliquis Code Status: Full code Family Communication:care discussed with patient.  Disposition Plan:  Status is:  Inpatient Remains inpatient appropriate because: management of PE    Consultants:  CCM  Procedures:  ECHO doppler  Antimicrobials:    Subjective: To have chest pain, pleuritic chest pain. Discussed about initiation of Eliquis, he agreed to proceed.  He did report some gum bleeding from Eliquis and hematuria in the past  Objective: Vitals:   11/12/22 1500 11/12/22 1913 11/12/22 2310 11/13/22 0259  BP: (!) 125/96 (!) 141/97 (!) 139/96 130/88  Pulse: 69 71 81 80  Resp: 16 19 17 16   Temp: 98 F (36.7 C) 98.8 F (37.1 C) 98.5 F (36.9 C) 98.8 F (37.1 C)  TempSrc: Oral Oral Oral Oral  SpO2: 95% 97% 97% 97%  Weight:      Height:        Intake/Output Summary (Last 24 hours) at 11/13/2022 1057 Last data filed at 11/12/2022 2311 Gross per 24 hour  Intake 240 ml  Output 725 ml  Net -485 ml   Filed Weights   11/11/22 0716  Weight: 83.9 kg    Examination:  General exam: Appears calm and comfortable  Respiratory system: Clear to auscultation. Respiratory effort normal. Cardiovascular system: S1 & S2 heard, RRR. No JVD, murmurs, rubs, gallops or clicks. No pedal edema. Gastrointestinal system: Abdomen is nondistended, soft and nontender. No organomegaly or masses felt. Normal bowel sounds heard. Central nervous system: Alert and oriented.  Extremities: Symmetric 5 x 5 power.     Data Reviewed: I have personally reviewed following labs and imaging studies  CBC: Recent Labs  Lab 11/11/22 0729 11/12/22 0241 11/13/22 0325  WBC 9.7 6.9 7.2  HGB 14.4 12.5*  12.2*  HCT 43.5 39.0 38.4*  MCV 72.0* 72.4* 72.5*  PLT 283 274 337   Basic Metabolic Panel: Recent Labs  Lab 11/11/22 0729 11/11/22 1545 11/12/22 0241  NA 131* 132* 133*  K 4.3 4.0 4.2  CL 96* 95* 97*  CO2 22 21* 26  GLUCOSE 90 140* 94  BUN 5* 6 7  CREATININE 0.88 0.94 1.04  CALCIUM 8.9 8.6* 8.7*  MG  --  2.0  --    GFR: Estimated Creatinine Clearance: 90.8 mL/min (by C-G formula based on SCr of  1.04 mg/dL). Liver Function Tests: No results for input(s): "AST", "ALT", "ALKPHOS", "BILITOT", "PROT", "ALBUMIN" in the last 168 hours. No results for input(s): "LIPASE", "AMYLASE" in the last 168 hours. No results for input(s): "AMMONIA" in the last 168 hours. Coagulation Profile: No results for input(s): "INR", "PROTIME" in the last 168 hours. Cardiac Enzymes: No results for input(s): "CKTOTAL", "CKMB", "CKMBINDEX", "TROPONINI" in the last 168 hours. BNP (last 3 results) No results for input(s): "PROBNP" in the last 8760 hours. HbA1C: Recent Labs    11/11/22 1945  HGBA1C 4.7*   CBG: No results for input(s): "GLUCAP" in the last 168 hours. Lipid Profile: No results for input(s): "CHOL", "HDL", "LDLCALC", "TRIG", "CHOLHDL", "LDLDIRECT" in the last 72 hours. Thyroid Function Tests: Recent Labs    11/11/22 1546  TSH 1.986   Anemia Panel: No results for input(s): "VITAMINB12", "FOLATE", "FERRITIN", "TIBC", "IRON", "RETICCTPCT" in the last 72 hours. Sepsis Labs: Recent Labs  Lab 11/11/22 1106  LATICACIDVEN 2.3*    No results found for this or any previous visit (from the past 240 hour(s)).       Radiology Studies: ECHOCARDIOGRAM COMPLETE  Result Date: 11/11/2022    ECHOCARDIOGRAM REPORT   Patient Name:   Markavious Hettinger. Date of Exam: 11/11/2022 Medical Rec #:  [id number]           Height:       75.0 in Accession #:    [id number]          Weight:       185.0 lb Date of Birth:  [birth year]           BSA:          2.123 m Patient Age:    57 years            BP:           131/99 mmHg Patient Gender: M                   HR:           75 bpm. Exam Location:  Inpatient Procedure: 2D Echo, Cardiac Doppler and Color Doppler                             MODIFIED REPORT: This report was modified by Olga Millers MD on 11/11/2022 due to change.  Indications:     Pulm Embolism  History:         Patient has prior history of Echocardiogram examinations.                   Signs/Symptoms:Syncope; Risk Factors:Current Smoker and                  Hypertension.  Sonographer:     Dondra Prader RVT Referring Phys:  [id number] GRACE E BOWSER Diagnosing Phys: Olga Millers MD  Sonographer Comments: Probable thrombus

## 2022-11-13 NOTE — TOC CM/SW Note (Signed)
Transition of Care [facility]) - Inpatient Brief Assessment Donn Pierini RN,BSN Transitions of Care Unit 4NP (Non Trauma)- RN Case Manager See Treatment Team for direct Phone #   Patient Details  Name: Travis Reed. MRN: [id number] Date of Birth: 12/16/1965  Transition of Care [facility]) CM/SW Contact:    Darrold Span, RN Phone Number: 11/13/2022, 11:00 AM   Clinical Narrative: We will continue to monitor patient advancement through interdisciplinary progression rounds. If new patient transition needs arise, please place a TOC consult.   Transition of Care Asessment: Insurance and Status: Insurance coverage has been reviewed Patient has primary care physician: Yes Home environment has been reviewed: home w/ spouse Prior level of function:: independent   Social Determinants of Health Reivew: SDOH reviewed no interventions necessary Readmission risk has been reviewed: Yes Transition of care needs: no transition of care needs at this time

## 2022-11-14 ENCOUNTER — Other Ambulatory Visit (HOSPITAL_COMMUNITY): Payer: Self-pay

## 2022-11-14 DIAGNOSIS — I2699 Other pulmonary embolism without acute cor pulmonale: Secondary | ICD-10-CM | POA: Diagnosis not present

## 2022-11-14 LAB — CBC
HCT: 39.4 % (ref 39.0–52.0)
Hemoglobin: 12.8 g/dL — ABNORMAL LOW (ref 13.0–17.0)
MCH: 23.9 pg — ABNORMAL LOW (ref 26.0–34.0)
MCHC: 32.5 g/dL (ref 30.0–36.0)
MCV: 73.5 fL — ABNORMAL LOW (ref 80.0–100.0)
Platelets: 389 10*3/uL (ref 150–400)
RBC: 5.36 MIL/uL (ref 4.22–5.81)
RDW: 16.4 % — ABNORMAL HIGH (ref 11.5–15.5)
WBC: 8.9 10*3/uL (ref 4.0–10.5)
nRBC: 0 % (ref 0.0–0.2)

## 2022-11-14 MED ORDER — OXYCODONE-ACETAMINOPHEN 5-325 MG PO TABS
1.0000 | ORAL_TABLET | Freq: Four times a day (QID) | ORAL | 0 refills | Status: AC | PRN
  Filled 2022-11-14: qty 30, 5d supply, fill #0

## 2022-11-14 MED ORDER — OXYCODONE-ACETAMINOPHEN 5-325 MG PO TABS
1.0000 | ORAL_TABLET | Freq: Four times a day (QID) | ORAL | Status: DC | PRN
  Administered 2022-11-14: 2 via ORAL
  Filled 2022-11-14: qty 2

## 2022-11-14 MED ORDER — APIXABAN (ELIQUIS) VTE STARTER PACK (10MG AND 5MG)
ORAL_TABLET | ORAL | 0 refills | Status: AC
Start: 2022-11-14 — End: ?
  Filled 2022-11-14: qty 74, 30d supply, fill #0

## 2022-11-14 MED ORDER — NICOTINE 14 MG/24HR TD PT24
14.0000 mg | MEDICATED_PATCH | Freq: Every day | TRANSDERMAL | 0 refills | Status: DC
Start: 2022-11-15 — End: 2023-07-30
  Filled 2022-11-14: qty 28, 28d supply, fill #0

## 2022-11-14 MED ORDER — APIXABAN 5 MG PO TABS
5.0000 mg | ORAL_TABLET | Freq: Two times a day (BID) | ORAL | 2 refills | Status: AC
Start: 2022-12-11 — End: ?
  Filled 2022-11-14: qty 60, 30d supply, fill #0

## 2022-11-14 MED ORDER — FOLIC ACID 1 MG PO TABS
1.0000 mg | ORAL_TABLET | Freq: Every day | ORAL | 0 refills | Status: AC
Start: 2022-11-14 — End: 2022-12-14
  Filled 2022-11-14 (×2): qty 30, 30d supply, fill #0

## 2022-11-14 MED ORDER — ADULT MULTIVITAMIN W/MINERALS CH
1.0000 | ORAL_TABLET | Freq: Every day | ORAL | 0 refills | Status: DC
Start: 2022-11-15 — End: 2023-07-30
  Filled 2022-11-14: qty 130, 130d supply, fill #0

## 2022-11-14 MED ORDER — THIAMINE HCL 100 MG PO TABS
100.0000 mg | ORAL_TABLET | Freq: Every day | ORAL | 0 refills | Status: DC
Start: 2022-11-15 — End: 2023-07-30
  Filled 2022-11-14: qty 30, 30d supply, fill #0

## 2022-11-14 NOTE — TOC Transition Note (Addendum)
Transition of Care [facility]) - CM/SW Discharge Note   Patient Details  Name: Travis Reed. MRN: [id number] Date of Birth: 1965/11/18  Transition of Care [facility]) CM/SW Contact:  Leone Haven, RN Phone Number: 11/14/2022, 10:20 AM   Clinical Narrative:    Patient for dc today, he states his wife will transport him home. He states he does not need SA resources.  Eliquis copay is 4.00.      Barriers to Discharge: Continued Medical Work up   Patient Goals and CMS Choice      Discharge Placement                         Discharge Plan and Services Additional resources added to the After Visit Summary for                                       Social Determinants of Health (SDOH) Interventions SDOH Screenings   Food Insecurity: No Food Insecurity (11/11/2022)  Housing: Low Risk  (11/11/2022)  Transportation Needs: No Transportation Needs (11/11/2022)  Utilities: Not At Risk (11/11/2022)  Depression (PHQ2-9): Low Risk  (07/21/2018)  Tobacco Use: High Risk (11/11/2022)     Readmission Risk Interventions     No data to display

## 2022-11-14 NOTE — Evaluation (Signed)
Physical Therapy Evaluation Patient Details Name: Travis Reed. MRN: [id number] DOB: 11/12/1965 Today's Date: 11/14/2022  History of Present Illness  Pt is a 57 y.o. male who presented 11/11/22 with acute onset of chest/back pain. CT showed bilateral PE some concern for right heart strain by CT, Doppler right lower extremity positive for acute DVT right common femoral vein, SF junction, right femoral vein, right proximal profunda vein, right popliteal vein right posterior tibial vein, right peroneal pain and partial thrombus noted in the ED IV and distal CIV. PMH: PE unprovoked treated for 6 months, HTN, tobacco abuse, daily EtOH   Clinical Impression  Pt presents with condition above and deficits mentioned below, see PT Problem List. PTA, he was independent without DME and living with his wife in a 1-level apartment with x10 STE. Currently, pt is primarily limited in mobility tolerance and balance by his R leg pain. He is displaying an antalgic gait pattern and having difficulty descending stairs in particular due to his R leg pain. However, pt did not need an AD or physical assistance. No LOB noted. Pt will likely progress well as his pain improves as he mobilizes more. Thus, no post-acute PT needs, but will continue to follow acutely to maximize his return to baseline prior to d/c home.         If plan is discharge home, recommend the following: Assistance with cooking/housework;Assist for transportation;Help with stairs or ramp for entrance   Can travel by private vehicle        Equipment Recommendations None recommended by PT  Recommendations for Other Services       Functional Status Assessment Patient has had a recent decline in their functional status and demonstrates the ability to make significant improvements in function in a reasonable and predictable amount of time.     Precautions / Restrictions Precautions Precautions: Fall (low) Restrictions Weight Bearing  Restrictions: No      Mobility  Bed Mobility Overal bed mobility: Modified Independent             General bed mobility comments: HOB elevated, no assistance needed    Transfers Overall transfer level: Independent Equipment used: None               General transfer comment: No assistance needed, no LOB    Ambulation/Gait Ambulation/Gait assistance: Supervision Gait Distance (Feet): 320 Feet Assistive device: None Gait Pattern/deviations: Step-through pattern, Decreased stride length, Decreased weight shift to right, Decreased stance time - right, Antalgic, Wide base of support Gait velocity: WFL Gait velocity interpretation: >4.37 ft/sec, indicative of normal walking speed   General Gait Details: Pt ambulates with a wide BOS and decreased R weigth shift/stance time. Pt has an antalgic gait pattern due to R leg pain. No overt LOB, even when cued to turn head L <>R and up <> down. Supervision for safety  Stairs Stairs: Yes Stairs assistance: Supervision Stair Management: One rail Right, Two rails, One rail Left, Step to pattern, Alternating pattern, Forwards Number of Stairs: 10 General stair comments: Ascends with intermittent L rail use and reciprocal pattern, antalgic steps with R leg noted. Descends with step-to pattern, needing cues to use R rail for support and stability as pt had increased difficulty descending due to R leg pain. No LOB, supervision for safety  Wheelchair Mobility     Tilt Bed    Modified Rankin (Stroke Patients Only)       Balance Overall balance assessment: Mild deficits observed, not formally tested  Pertinent Vitals/Pain Pain Assessment Pain Assessment: 0-10 Pain Score: 9  Pain Location: R chest and R leg Pain Descriptors / Indicators: Throbbing Pain Intervention(s): Limited activity within patient's tolerance, Monitored during session, Premedicated before session,  Repositioned    Home Living Family/patient expects to be discharged to:: Private residence Living Arrangements: Spouse/significant other Available Help at Discharge: Family;Available 24 hours/day Type of Home: Apartment Home Access: Stairs to enter Entrance Stairs-Rails: Left Entrance Stairs-Number of Steps: 10   Home Layout: One level Home Equipment: None      Prior Function Prior Level of Function : Independent/Modified Independent;Driving             Mobility Comments: No AD ADLs Comments: Wife does majority of transportation or takes local transportation, can drive but does not     Extremity/Trunk Assessment   Upper Extremity Assessment Upper Extremity Assessment: Overall WFL for tasks assessed    Lower Extremity Assessment Lower Extremity Assessment: RLE deficits/detail RLE Deficits / Details: pain limited weight bearing and activity tolerance but overall WFL strength    Cervical / Trunk Assessment Cervical / Trunk Assessment: Normal  Communication   Communication Communication: No apparent difficulties  Cognition Arousal: Alert Behavior During Therapy: WFL for tasks assessed/performed Overall Cognitive Status: Within Functional Limits for tasks assessed                                          General Comments General comments (skin integrity, edema, etc.): educated pt on importance of mobility to prevent further clots and to elevate leg at rest to manage edema    Exercises     Assessment/Plan    PT Assessment Patient needs continued PT services  PT Problem List Decreased activity tolerance;Decreased balance;Decreased mobility;Pain       PT Treatment Interventions DME instruction;Gait training;Stair training;Functional mobility training;Therapeutic activities;Therapeutic exercise;Balance training;Neuromuscular re-education;Patient/family education    PT Goals (Current goals can be found in the Care Plan section)  Acute Rehab PT  Goals Patient Stated Goal: to go home PT Goal Formulation: With patient Time For Goal Achievement: 11/28/22 Potential to Achieve Goals: Good    Frequency Min 1X/week     Co-evaluation               AM-PAC PT "6 Clicks" Mobility  Outcome Measure Help needed turning from your back to your side while in a flat bed without using bedrails?: None Help needed moving from lying on your back to sitting on the side of a flat bed without using bedrails?: None Help needed moving to and from a bed to a chair (including a wheelchair)?: None Help needed standing up from a chair using your arms (e.g., wheelchair or bedside chair)?: None Help needed to walk in hospital room?: A Little Help needed climbing 3-5 steps with a railing? : A Little 6 Click Score: 22    End of Session Equipment Utilized During Treatment: Gait belt Activity Tolerance: Patient tolerated treatment well Patient left: in bed;with call bell/phone within reach;with bed alarm set Nurse Communication: Mobility status PT Visit Diagnosis: Unsteadiness on feet (R26.81);Other abnormalities of gait and mobility (R26.89);Difficulty in walking, not elsewhere classified (R26.2);Pain Pain - Right/Left: Right Pain - part of body: Leg    Time: 5784-6962 PT Time Calculation (min) (ACUTE ONLY): 10 min   Charges:   PT Evaluation $PT Eval Low Complexity: 1 Low   PT General Charges $$ ACUTE  PT VISIT: 1 Visit         Virgil Benedict, PT, DPT Acute Rehabilitation Services  Office: [phone]   Bettina Gavia 11/14/2022, 10:03 AM

## 2022-11-14 NOTE — Discharge Summary (Signed)
Physician Discharge Summary   Patient: Travis Reed. MRN: [id number] DOB: 08/28/1965  Admit date:     11/11/2022  Discharge date: 11/14/22  Discharge Physician: Alba Cory   PCP: Hoy Register, MD   Recommendations at discharge:   Needs appropriate screening for age for malignancy in setting of unprovoked PE Continue counseling in regards alcohol use.   Discharge Diagnoses: Principal Problem:   PE (pulmonary thromboembolism) (HCC) Active Problems:   Pulmonary infarct (HCC)   Bilateral pulmonary embolism (HCC)   Acute deep vein thrombosis (DVT) of proximal vein of right lower extremity (HCC)  Resolved Problems:   * No resolved hospital problems. [facility] Course: 57 year old with past medical history significant for PE unprovoked treated for 6 months, hypertension, tobacco abuse, daily EtOH presents with acute onset of chest back pain shortness of breath on 9/16.  Associated with right leg pain.  CT showed bilateral PE some concern for right heart strain by CT, Doppler right lower extremity positive for acute DVT right common femoral vein, SF junction, right femoral vein, right proximal profunda vein, right popliteal vein right posterior tibial vein, right peroneal pain and partial thrombus noted in the ED IV and distal CIV.   Patient admitted with recurrent PE and lower extremity DVT     Assessment and Plan: 1-Unprovoked bilateral PE Right lower extremity DVT -Bilateral history of PE 2 years ago treated with Eliquis for 84-month -Evaluated by  CCM have recommended anticoagulation -He received heparin drip for 48 hours, he was transition to eliquis.  2D echo with normal right ventricular function Patient will need appropriate screening for age for malignancy Vitals stable, saturation 97 on room air He is still having chest pain and LE pain. Change Vicodin to percocet. Plan to discharge this afternoon if pain is controlled.   Tobacco dependence: Counseling    Hypertension: Continue to hold blood pressure medication   Alcohol  abuse: Monitor on CIWA   Mild hyponatremia; monitor.  Lactic acidosis: In the setting of PE           Consultants: CCM Procedures performed: ECHO, doppler Disposition: Home Diet recommendation:  Discharge Diet Orders (From admission, onward)     Start     Ordered   11/14/22 0000  Diet - low sodium heart healthy        11/14/22 0949           Cardiac diet DISCHARGE MEDICATION: Allergies as of 11/14/2022       Reactions   Penicillins Other (See Comments)   Unknown childhood reaction: Did it involve swelling of the face/tongue/throat, SOB, or low BP? Unk Did it involve sudden or severe rash/hives, skin peeling, or any reaction on the inside of your mouth or nose? Unk Did you need to seek medical attention at a hospital or doctor's office? Unk When did it last happen? "I was a child" If all above answers are "NO", may proceed with cephalosporin use.        Medication List     TAKE these medications    Apixaban Starter Pack (10mg  and 5mg ) Commonly known as: ELIQUIS STARTER PACK Take as directed on package: start with two-5mg  tablets twice daily for 7 days. On day 8, switch to one-5mg  tablet twice daily.   folic acid 1 MG tablet Commonly known as: FOLVITE Take 1 tablet (1 mg total) by mouth daily.   multivitamin with minerals Tabs tablet Take 1 tablet by mouth daily. Start taking on: November 15, 2022  nicotine 14 mg/24hr patch Commonly known as: NICODERM CQ - dosed in mg/24 hours Place 1 patch (14 mg total) onto the skin daily. Start taking on: November 15, 2022   oxyCODONE-acetaminophen 5-325 MG tablet Commonly known as: PERCOCET/ROXICET Take 1-2 tablets by mouth every 6 (six) hours as needed for up to 5 days for severe pain.   thiamine 100 MG tablet Commonly known as: Vitamin B-1 Take 1 tablet (100 mg total) by mouth daily. Start taking on: November 15, 2022         Follow-up Information     Hoy Register, MD Follow up in 1 week(s).   Specialty: Family Medicine Contact information: [address] [place] [address] [place] [address] [phone]                Discharge Exam: Ceasar Mons Weights   11/11/22 0716 11/14/22 0500  Weight: 83.9 kg 65.8 kg   General; NAD  Condition at discharge: stable  The results of significant diagnostics from this hospitalization (including imaging, microbiology, ancillary and laboratory) are listed below for reference.   Imaging Studies: VAS [place] LOWER EXTREMITY VENOUS (DVT) (7a-7p)  Result Date: 11/11/2022  Lower Venous DVT Study Patient Name:  Travis Reed.  Date of Exam:   11/11/2022 Medical Rec #: [id number]            Accession #:    [id number] Date of Birth: 1966-01-07            Patient Gender: M Patient Age:   20 years Exam Location:  [facility] [place] Procedure:      VAS [place] LOWER EXTREMITY VENOUS (DVT) Referring Phys: Riki Sheer --------------------------------------------------------------------------------  Indications: Pain, and Swelling.  Risk Factors: Remote history. Limitations: Abdominal gas. Comparison Study: No prior study on file Performing Technologist: Sherren Kerns RVS  Examination Guidelines: A complete evaluation includes B-mode imaging, spectral Doppler, color Doppler, and power Doppler as needed of all accessible portions of each vessel. Bilateral testing is considered an integral part of a complete examination. Limited examinations for reoccurring indications may be performed as noted. The reflux portion of the exam is performed with the patient in reverse Trendelenburg.  +----------+---------------+---------+-----------+----------+--------------+ RIGHT     CompressibilityPhasicitySpontaneityPropertiesThrombus Aging +----------+---------------+---------+-----------+----------+--------------+ CFV       None           No       No                   Acute           +----------+---------------+---------+-----------+----------+--------------+ SFJ       None           No       No                   Acute          +----------+---------------+---------+-----------+----------+--------------+ FV Prox   None           No       No                   Acute          +----------+---------------+---------+-----------+----------+--------------+ FV Mid    None           No       No                   Acute          +----------+---------------+---------+-----------+----------+--------------+ FV Distal None  Physician Discharge Summary   Patient: Travis Reed. MRN: [id number] DOB: 08/28/1965  Admit date:     11/11/2022  Discharge date: 11/14/22  Discharge Physician: Alba Cory   PCP: Hoy Register, MD   Recommendations at discharge:   Needs appropriate screening for age for malignancy in setting of unprovoked PE Continue counseling in regards alcohol use.   Discharge Diagnoses: Principal Problem:   PE (pulmonary thromboembolism) (HCC) Active Problems:   Pulmonary infarct (HCC)   Bilateral pulmonary embolism (HCC)   Acute deep vein thrombosis (DVT) of proximal vein of right lower extremity (HCC)  Resolved Problems:   * No resolved hospital problems. [facility] Course: 57 year old with past medical history significant for PE unprovoked treated for 6 months, hypertension, tobacco abuse, daily EtOH presents with acute onset of chest back pain shortness of breath on 9/16.  Associated with right leg pain.  CT showed bilateral PE some concern for right heart strain by CT, Doppler right lower extremity positive for acute DVT right common femoral vein, SF junction, right femoral vein, right proximal profunda vein, right popliteal vein right posterior tibial vein, right peroneal pain and partial thrombus noted in the ED IV and distal CIV.   Patient admitted with recurrent PE and lower extremity DVT     Assessment and Plan: 1-Unprovoked bilateral PE Right lower extremity DVT -Bilateral history of PE 2 years ago treated with Eliquis for 84-month -Evaluated by  CCM have recommended anticoagulation -He received heparin drip for 48 hours, he was transition to eliquis.  2D echo with normal right ventricular function Patient will need appropriate screening for age for malignancy Vitals stable, saturation 97 on room air He is still having chest pain and LE pain. Change Vicodin to percocet. Plan to discharge this afternoon if pain is controlled.   Tobacco dependence: Counseling    Hypertension: Continue to hold blood pressure medication   Alcohol  abuse: Monitor on CIWA   Mild hyponatremia; monitor.  Lactic acidosis: In the setting of PE           Consultants: CCM Procedures performed: ECHO, doppler Disposition: Home Diet recommendation:  Discharge Diet Orders (From admission, onward)     Start     Ordered   11/14/22 0000  Diet - low sodium heart healthy        11/14/22 0949           Cardiac diet DISCHARGE MEDICATION: Allergies as of 11/14/2022       Reactions   Penicillins Other (See Comments)   Unknown childhood reaction: Did it involve swelling of the face/tongue/throat, SOB, or low BP? Unk Did it involve sudden or severe rash/hives, skin peeling, or any reaction on the inside of your mouth or nose? Unk Did you need to seek medical attention at a hospital or doctor's office? Unk When did it last happen? "I was a child" If all above answers are "NO", may proceed with cephalosporin use.        Medication List     TAKE these medications    Apixaban Starter Pack (10mg  and 5mg ) Commonly known as: ELIQUIS STARTER PACK Take as directed on package: start with two-5mg  tablets twice daily for 7 days. On day 8, switch to one-5mg  tablet twice daily.   folic acid 1 MG tablet Commonly known as: FOLVITE Take 1 tablet (1 mg total) by mouth daily.   multivitamin with minerals Tabs tablet Take 1 tablet by mouth daily. Start taking on: November 15, 2022  Physician Discharge Summary   Patient: Travis Reed. MRN: [id number] DOB: 08/28/1965  Admit date:     11/11/2022  Discharge date: 11/14/22  Discharge Physician: Alba Cory   PCP: Hoy Register, MD   Recommendations at discharge:   Needs appropriate screening for age for malignancy in setting of unprovoked PE Continue counseling in regards alcohol use.   Discharge Diagnoses: Principal Problem:   PE (pulmonary thromboembolism) (HCC) Active Problems:   Pulmonary infarct (HCC)   Bilateral pulmonary embolism (HCC)   Acute deep vein thrombosis (DVT) of proximal vein of right lower extremity (HCC)  Resolved Problems:   * No resolved hospital problems. [facility] Course: 57 year old with past medical history significant for PE unprovoked treated for 6 months, hypertension, tobacco abuse, daily EtOH presents with acute onset of chest back pain shortness of breath on 9/16.  Associated with right leg pain.  CT showed bilateral PE some concern for right heart strain by CT, Doppler right lower extremity positive for acute DVT right common femoral vein, SF junction, right femoral vein, right proximal profunda vein, right popliteal vein right posterior tibial vein, right peroneal pain and partial thrombus noted in the ED IV and distal CIV.   Patient admitted with recurrent PE and lower extremity DVT     Assessment and Plan: 1-Unprovoked bilateral PE Right lower extremity DVT -Bilateral history of PE 2 years ago treated with Eliquis for 84-month -Evaluated by  CCM have recommended anticoagulation -He received heparin drip for 48 hours, he was transition to eliquis.  2D echo with normal right ventricular function Patient will need appropriate screening for age for malignancy Vitals stable, saturation 97 on room air He is still having chest pain and LE pain. Change Vicodin to percocet. Plan to discharge this afternoon if pain is controlled.   Tobacco dependence: Counseling    Hypertension: Continue to hold blood pressure medication   Alcohol  abuse: Monitor on CIWA   Mild hyponatremia; monitor.  Lactic acidosis: In the setting of PE           Consultants: CCM Procedures performed: ECHO, doppler Disposition: Home Diet recommendation:  Discharge Diet Orders (From admission, onward)     Start     Ordered   11/14/22 0000  Diet - low sodium heart healthy        11/14/22 0949           Cardiac diet DISCHARGE MEDICATION: Allergies as of 11/14/2022       Reactions   Penicillins Other (See Comments)   Unknown childhood reaction: Did it involve swelling of the face/tongue/throat, SOB, or low BP? Unk Did it involve sudden or severe rash/hives, skin peeling, or any reaction on the inside of your mouth or nose? Unk Did you need to seek medical attention at a hospital or doctor's office? Unk When did it last happen? "I was a child" If all above answers are "NO", may proceed with cephalosporin use.        Medication List     TAKE these medications    Apixaban Starter Pack (10mg  and 5mg ) Commonly known as: ELIQUIS STARTER PACK Take as directed on package: start with two-5mg  tablets twice daily for 7 days. On day 8, switch to one-5mg  tablet twice daily.   folic acid 1 MG tablet Commonly known as: FOLVITE Take 1 tablet (1 mg total) by mouth daily.   multivitamin with minerals Tabs tablet Take 1 tablet by mouth daily. Start taking on: November 15, 2022  nicotine 14 mg/24hr patch Commonly known as: NICODERM CQ - dosed in mg/24 hours Place 1 patch (14 mg total) onto the skin daily. Start taking on: November 15, 2022   oxyCODONE-acetaminophen 5-325 MG tablet Commonly known as: PERCOCET/ROXICET Take 1-2 tablets by mouth every 6 (six) hours as needed for up to 5 days for severe pain.   thiamine 100 MG tablet Commonly known as: Vitamin B-1 Take 1 tablet (100 mg total) by mouth daily. Start taking on: November 15, 2022         Follow-up Information     Hoy Register, MD Follow up in 1 week(s).   Specialty: Family Medicine Contact information: [address] [place] [address] [place] [address] [phone]                Discharge Exam: Ceasar Mons Weights   11/11/22 0716 11/14/22 0500  Weight: 83.9 kg 65.8 kg   General; NAD  Condition at discharge: stable  The results of significant diagnostics from this hospitalization (including imaging, microbiology, ancillary and laboratory) are listed below for reference.   Imaging Studies: VAS [place] LOWER EXTREMITY VENOUS (DVT) (7a-7p)  Result Date: 11/11/2022  Lower Venous DVT Study Patient Name:  Travis Reed.  Date of Exam:   11/11/2022 Medical Rec #: [id number]            Accession #:    [id number] Date of Birth: 1966-01-07            Patient Gender: M Patient Age:   20 years Exam Location:  [facility] [place] Procedure:      VAS [place] LOWER EXTREMITY VENOUS (DVT) Referring Phys: Riki Sheer --------------------------------------------------------------------------------  Indications: Pain, and Swelling.  Risk Factors: Remote history. Limitations: Abdominal gas. Comparison Study: No prior study on file Performing Technologist: Sherren Kerns RVS  Examination Guidelines: A complete evaluation includes B-mode imaging, spectral Doppler, color Doppler, and power Doppler as needed of all accessible portions of each vessel. Bilateral testing is considered an integral part of a complete examination. Limited examinations for reoccurring indications may be performed as noted. The reflux portion of the exam is performed with the patient in reverse Trendelenburg.  +----------+---------------+---------+-----------+----------+--------------+ RIGHT     CompressibilityPhasicitySpontaneityPropertiesThrombus Aging +----------+---------------+---------+-----------+----------+--------------+ CFV       None           No       No                   Acute           +----------+---------------+---------+-----------+----------+--------------+ SFJ       None           No       No                   Acute          +----------+---------------+---------+-----------+----------+--------------+ FV Prox   None           No       No                   Acute          +----------+---------------+---------+-----------+----------+--------------+ FV Mid    None           No       No                   Acute          +----------+---------------+---------+-----------+----------+--------------+ FV Distal None  nicotine 14 mg/24hr patch Commonly known as: NICODERM CQ - dosed in mg/24 hours Place 1 patch (14 mg total) onto the skin daily. Start taking on: November 15, 2022   oxyCODONE-acetaminophen 5-325 MG tablet Commonly known as: PERCOCET/ROXICET Take 1-2 tablets by mouth every 6 (six) hours as needed for up to 5 days for severe pain.   thiamine 100 MG tablet Commonly known as: Vitamin B-1 Take 1 tablet (100 mg total) by mouth daily. Start taking on: November 15, 2022         Follow-up Information     Hoy Register, MD Follow up in 1 week(s).   Specialty: Family Medicine Contact information: [address] [place] [address] [place] [address] [phone]                Discharge Exam: Ceasar Mons Weights   11/11/22 0716 11/14/22 0500  Weight: 83.9 kg 65.8 kg   General; NAD  Condition at discharge: stable  The results of significant diagnostics from this hospitalization (including imaging, microbiology, ancillary and laboratory) are listed below for reference.   Imaging Studies: VAS [place] LOWER EXTREMITY VENOUS (DVT) (7a-7p)  Result Date: 11/11/2022  Lower Venous DVT Study Patient Name:  Travis Reed.  Date of Exam:   11/11/2022 Medical Rec #: [id number]            Accession #:    [id number] Date of Birth: 1966-01-07            Patient Gender: M Patient Age:   20 years Exam Location:  [facility] [place] Procedure:      VAS [place] LOWER EXTREMITY VENOUS (DVT) Referring Phys: Riki Sheer --------------------------------------------------------------------------------  Indications: Pain, and Swelling.  Risk Factors: Remote history. Limitations: Abdominal gas. Comparison Study: No prior study on file Performing Technologist: Sherren Kerns RVS  Examination Guidelines: A complete evaluation includes B-mode imaging, spectral Doppler, color Doppler, and power Doppler as needed of all accessible portions of each vessel. Bilateral testing is considered an integral part of a complete examination. Limited examinations for reoccurring indications may be performed as noted. The reflux portion of the exam is performed with the patient in reverse Trendelenburg.  +----------+---------------+---------+-----------+----------+--------------+ RIGHT     CompressibilityPhasicitySpontaneityPropertiesThrombus Aging +----------+---------------+---------+-----------+----------+--------------+ CFV       None           No       No                   Acute           +----------+---------------+---------+-----------+----------+--------------+ SFJ       None           No       No                   Acute          +----------+---------------+---------+-----------+----------+--------------+ FV Prox   None           No       No                   Acute          +----------+---------------+---------+-----------+----------+--------------+ FV Mid    None           No       No                   Acute          +----------+---------------+---------+-----------+----------+--------------+ FV Distal None  acute intramuscular thrombosis involving the right gastrocnemius veins, and right soleal veins. - No cystic structure found in the popliteal fossa.  LEFT: - No evidence of common femoral vein obstruction.   *See table(s) above for measurements and observations. Electronically signed by Lemar Livings MD on 11/11/2022 at 4:45:44 PM.    Final    ECHOCARDIOGRAM COMPLETE  Result Date: 11/11/2022    ECHOCARDIOGRAM REPORT   Patient Name:   Travis Reed. Date of Exam: 11/11/2022 Medical Rec #:  [id number]           Height:       75.0 in Accession #:    [id number]          Weight:       185.0 lb Date of Birth:  April 07, 1965           BSA:          2.123 m Patient Age:    57 years            BP:           131/99 mmHg Patient Gender: M                   HR:           75 bpm. Exam Location:  Inpatient Procedure: 2D Echo, Cardiac Doppler and Color Doppler                             MODIFIED REPORT: This report was modified by Olga Millers MD on 11/11/2022 due to change.  Indications:     Pulm Embolism  History:         Patient has prior history of Echocardiogram examinations.                  Signs/Symptoms:Syncope; Risk  Factors:Current Smoker and                  Hypertension.  Sonographer:     Dondra Prader RVT Referring Phys:  [id number] GRACE E BOWSER Diagnosing Phys: Olga Millers MD  Sonographer Comments: Probable thrombus in the IVC IMPRESSIONS  1. IVC not well visualized; cannot R/O thrombus.  2. Left ventricular ejection fraction, by estimation, is 70 to 75%. The left ventricle has hyperdynamic function. The left ventricle has no regional wall motion abnormalities. Left ventricular diastolic parameters were normal.  3. Right ventricular systolic function is normal. The right ventricular size is normal.  4. The mitral valve is normal in structure. No evidence of mitral valve regurgitation. No evidence of mitral stenosis.  5. The aortic valve is tricuspid. Aortic valve regurgitation is not visualized. Aortic valve sclerosis/calcification is present, without any evidence of aortic stenosis.  6. The inferior vena cava is normal in size with greater than 50% respiratory variability, suggesting right atrial pressure of 3 mmHg. Comparison(s): No significant change from prior study. FINDINGS  Left Ventricle: Left ventricular ejection fraction, by estimation, is 70 to 75%. The left ventricle has hyperdynamic function. The left ventricle has no regional wall motion abnormalities. The left ventricular internal cavity size was normal in size. There is no left ventricular hypertrophy. Left ventricular diastolic parameters were normal. Right Ventricle: The right ventricular size is normal. Right ventricular systolic function is normal. Left Atrium: Left atrial size was normal in size. Right Atrium: Right atrial size was normal in size. Pericardium: There is no evidence of pericardial effusion. Mitral Valve: The mitral valve

## 2022-11-14 NOTE — Discharge Instructions (Addendum)
Information on my medicine - ELIQUIS (apixaban)  This medication education was reviewed with me or my healthcare representative as part of my discharge preparation.  The pharmacist that spoke with me during my hospital stay was:  Laria Grimmett A Christopherjame Carnell, RPH  Why was Eliquis prescribed for you? Eliquis was prescribed to treat blood clots that may have been found in the veins of your legs (deep vein thrombosis) or in your lungs (pulmonary embolism) and to reduce the risk of them occurring again.  What do You need to know about Eliquis ? The starting dose is 10 mg (two 5 mg tablets) taken TWICE daily for the FIRST SEVEN (7) DAYS, then on (enter date)  11/21/22  the dose is reduced to ONE 5 mg tablet taken TWICE daily.  Eliquis may be taken with or without food.   Try to take the dose about the same time in the morning and in the evening. If you have difficulty swallowing the tablet whole please discuss with your pharmacist how to take the medication safely.  Take Eliquis exactly as prescribed and DO NOT stop taking Eliquis without talking to the doctor who prescribed the medication.  Stopping may increase your risk of developing a new blood clot.  Refill your prescription before you run out.  After discharge, you should have regular check-up appointments with your healthcare provider that is prescribing your Eliquis.    What do you do if you miss a dose? If a dose of ELIQUIS is not taken at the scheduled time, take it as soon as possible on the same day and twice-daily administration should be resumed. The dose should not be doubled to make up for a missed dose.  Important Safety Information A possible side effect of Eliquis is bleeding. You should call your healthcare provider right away if you experience any of the following: Bleeding from an injury or your nose that does not stop. Unusual colored urine (red or dark brown) or unusual colored stools (red or black). Unusual bruising for  unknown reasons. A serious fall or if you hit your head (even if there is no bleeding).  Some medicines may interact with Eliquis and might increase your risk of bleeding or clotting while on Eliquis. To help avoid this, consult your healthcare provider or pharmacist prior to using any new prescription or non-prescription medications, including herbals, vitamins, non-steroidal anti-inflammatory drugs (NSAIDs) and supplements.  This website has more information on Eliquis (apixaban): http://www.eliquis.com/eliquis/home

## 2022-11-14 NOTE — Progress Notes (Signed)
TOC meds given to patient

## 2022-11-18 ENCOUNTER — Telehealth: Payer: Self-pay

## 2022-11-18 NOTE — Transitions of Care (Post Inpatient/ED Visit) (Signed)
11/18/2022  Name: Travis Reed. MRN: [id number] DOB: 1966/01/04  Today's TOC FU Call Status: Today's TOC FU Call Status:: Unsuccessful Call (1st Attempt) Unsuccessful Call (1st Attempt) Date: 11/18/22  Attempted to reach the patient regarding the most recent Inpatient/ED visit.  Follow Up Plan: Additional outreach attempts will be made to reach the patient to complete the Transitions of Care (Post Inpatient/ED visit) call.   Signature  Robyne Peers, RN

## 2022-11-19 ENCOUNTER — Telehealth: Payer: Self-pay

## 2022-11-19 NOTE — Transitions of Care (Post Inpatient/ED Visit) (Signed)
11/19/2022  Name: Travis Reed. MRN: [id number] DOB: Jul 15, 1965  Today's TOC FU Call Status: Today's TOC FU Call Status:: Successful TOC FU Call Completed Unsuccessful Call (1st Attempt) Date: 11/18/22 [facility] FU Call Complete Date: 11/19/22 Patient's Name and Date of Birth confirmed.  Transition Care Management Follow-up Telephone Call Date of Discharge: 11/14/22 Discharge Facility: [facility]) Type of Discharge: Inpatient Admission Primary Inpatient Discharge Diagnosis:: PE How have you been since you were released from the hospital?: Better Any questions or concerns?: Yes Patient Questions/Concerns:: he is concerned about the multiple bloot clots and he said he needs to take care of himself . Patient Questions/Concerns Addressed: Other: (I stressed the importance of taking the eliquis as ordered. He siad he has been on it in the past adn understands how to take it)  Items Reviewed: Did you receive and understand the discharge instructions provided?: Yes Medications obtained,verified, and reconciled?: Yes (Medications Reviewed) (He has all medications and did not have any questions about the med regime,) Any new allergies since your discharge?: No Dietary orders reviewed?: No Do you have support at home?: Yes People in Home: alone Name of Support/Comfort Primary Source: he said he has a place to stay but it is not permanent. he moves from place to place.  Medications Reviewed Today: Medications Reviewed Today     Reviewed by Robyne Peers, RN (Case Manager) on 11/19/22 at 1053  Med List Status: <None>   Medication Order Taking? Sig Documenting Provider Last Dose Status Informant  apixaban (ELIQUIS) 5 MG TABS tablet [id number]  Take 1 tablet (5 mg total) by mouth 2 (two) times daily. Start after starter pack is completed Regalado, Belkys A, MD  Active   APIXABAN (ELIQUIS) VTE STARTER PACK (10MG  AND 5MG ) [id number]  Take as directed on package: start with two-5mg  tablets  twice daily for 7 days. On day 8, switch to one-5mg  tablet twice daily. Regalado, Belkys A, MD  Active   folic acid (FOLVITE) 1 MG tablet [id number]  Take 1 tablet (1 mg total) by mouth daily. Regalado, Belkys A, MD  Active   Multiple Vitamin (MULTIVITAMIN WITH MINERALS) TABS tablet [id number]  Take 1 tablet by mouth daily. Regalado, Belkys A, MD  Active   nicotine (NICODERM CQ - DOSED IN MG/24 HOURS) 14 mg/24hr patch [id number]  Place 1 patch (14 mg total) onto the skin daily. Regalado, Belkys A, MD  Active   oxyCODONE-acetaminophen (PERCOCET/ROXICET) 5-325 MG tablet [id number]  Take 1-2 tablets by mouth every 6 (six) hours as needed for up to 5 days for severe pain. Regalado, Belkys A, MD  Active   thiamine (VITAMIN B1) 100 MG tablet [id number]  Take 1 tablet (100 mg total) by mouth daily. Regalado, Prentiss Bells, MD  Active             Home Care and Equipment/Supplies: Were Home Health Services Ordered?: No Any new equipment or medical supplies ordered?: No  Functional Questionnaire: Do you need assistance with bathing/showering or dressing?: No Do you need assistance with meal preparation?: No Do you need assistance with eating?: No Do you have difficulty maintaining continence: No Do you need assistance with getting out of bed/getting out of a chair/moving?: No Do you have difficulty managing or taking your medications?: No  Follow up appointments reviewed: PCP Follow-up appointment confirmed?: Yes Date of PCP follow-up appointment?: 12/04/22 Follow-up Provider: Gwinda Passe, NP - he will need to re-establish care with a PCP. [facility] Hospital Follow-up appointment confirmed?: NA Do you  need transportation to your follow-up appointment?: No Do you understand care options if your condition(s) worsen?: Yes-patient verbalized understanding    SIGNATURE. Robyne Peers, RN

## 2022-12-04 ENCOUNTER — Inpatient Hospital Stay (INDEPENDENT_AMBULATORY_CARE_PROVIDER_SITE_OTHER): Payer: Medicaid Other | Admitting: Primary Care

## 2022-12-23 ENCOUNTER — Other Ambulatory Visit (HOSPITAL_COMMUNITY): Payer: Self-pay

## 2023-02-03 ENCOUNTER — Emergency Department (HOSPITAL_COMMUNITY): Payer: 59

## 2023-02-03 ENCOUNTER — Other Ambulatory Visit: Payer: Self-pay

## 2023-02-03 ENCOUNTER — Encounter (HOSPITAL_COMMUNITY): Payer: Self-pay

## 2023-02-03 ENCOUNTER — Emergency Department (HOSPITAL_COMMUNITY)
Admission: EM | Admit: 2023-02-03 | Discharge: 2023-02-04 | Disposition: A | Discharge status: Home / Self Care | Payer: 59 | Attending: Emergency Medicine | Admitting: Emergency Medicine

## 2023-02-03 DIAGNOSIS — Z7901 Long term (current) use of anticoagulants: Secondary | ICD-10-CM | POA: Insufficient documentation

## 2023-02-03 DIAGNOSIS — Z720 Tobacco use: Secondary | ICD-10-CM

## 2023-02-03 DIAGNOSIS — F17200 Nicotine dependence, unspecified, uncomplicated: Secondary | ICD-10-CM | POA: Insufficient documentation

## 2023-02-03 DIAGNOSIS — I10 Essential (primary) hypertension: Secondary | ICD-10-CM | POA: Diagnosis not present

## 2023-02-03 DIAGNOSIS — M7989 Other specified soft tissue disorders: Secondary | ICD-10-CM | POA: Insufficient documentation

## 2023-02-03 DIAGNOSIS — R0789 Other chest pain: Secondary | ICD-10-CM | POA: Insufficient documentation

## 2023-02-03 DIAGNOSIS — R079 Chest pain, unspecified: Secondary | ICD-10-CM

## 2023-02-03 LAB — BASIC METABOLIC PANEL
Anion gap: 16 — ABNORMAL HIGH (ref 5–15)
BUN: 8 mg/dL (ref 6–20)
CO2: 20 mmol/L — ABNORMAL LOW (ref 22–32)
Calcium: 9.3 mg/dL (ref 8.9–10.3)
Chloride: 101 mmol/L (ref 98–111)
Creatinine, Ser: 0.88 mg/dL (ref 0.61–1.24)
GFR, Estimated: >60 mL/min (ref >60)
Glucose, Bld: 79 mg/dL (ref 70–99)
Potassium: 3.8 mmol/L (ref 3.5–5.1)
Sodium: 137 mmol/L (ref 135–145)

## 2023-02-03 LAB — CBC
HCT: 45.7 % (ref 39.0–52.0)
Hemoglobin: 15.2 g/dL (ref 13.0–17.0)
MCH: 21.6 pg — ABNORMAL LOW (ref 26.0–34.0)
MCHC: 33.3 g/dL (ref 30.0–36.0)
MCV: 64.8 fL — ABNORMAL LOW (ref 80.0–100.0)
Platelets: 342 10*3/uL (ref 150–400)
RBC: 7.05 MIL/uL — ABNORMAL HIGH (ref 4.22–5.81)
RDW: 19.9 % — ABNORMAL HIGH (ref 11.5–15.5)
WBC: 5.2 10*3/uL (ref 4.0–10.5)
nRBC: 0 % (ref 0.0–0.2)

## 2023-02-03 LAB — TROPONIN I (HIGH SENSITIVITY): Troponin I (High Sensitivity): 6 ng/L (ref <18)

## 2023-02-03 MED ORDER — IOHEXOL 350 MG/ML SOLN
75.0000 mL | Freq: Once | INTRAVENOUS | Status: AC | PRN
  Administered 2023-02-03: 75 mL via INTRAVENOUS

## 2023-02-03 NOTE — ED Provider Triage Note (Signed)
Emergency Medicine Provider Triage Evaluation Note  Travis Reed. , a 57 y.o. male  was evaluated in triage.  Pt complains of chest pain. Started 2 days ago. Located in the center of the chest. Worse with deep breaths. Also Endorsing right leg swelling that has been there for a "long time". Diagnosed with PE/DVT in September of this year. On Eliquis. States CP is similar to when he has had a PE in the past.   Review of Systems  Positive: See above Negative: See above  Physical Exam  BP (!) 111/90 (BP Location: Right Arm)   Pulse 94   Temp 97.9 F (36.6 C) (Oral)   Resp 16   Ht 6\' 3"  (1.905 m)   Wt 74.8 kg   SpO2 98%   BMI 20.62 kg/m  Gen:   Awake, no distress   Resp:  Normal effort  MSK:   Moves extremities without difficulty  Other:    Medical Decision Making  Medically screening exam initiated at 9:00 PM.  Appropriate orders placed.  Travis Reed. was informed that the remainder of the evaluation will be completed by another provider, this initial triage assessment does not replace that evaluation, and the importance of remaining in the ED until their evaluation is complete.  Work up   Gareth Eagle, PA-C 02/03/23 2108

## 2023-02-03 NOTE — ED Triage Notes (Signed)
Pt arrived from home via POV c/o chest pain 10/10, described as tightness accompanied with SOB. Pt also c/o RLE pain, thigh to the toes, 10/10. RLE is noted to be swollen. Pt noted to have recent dx in 10/2022 of PE and DVT of RLE.

## 2023-02-04 LAB — TROPONIN I (HIGH SENSITIVITY): Troponin I (High Sensitivity): 5 ng/L (ref <18)

## 2023-02-04 NOTE — ED Provider Notes (Signed)
[place] EM[facility]GENCY DEPARTMENT AT [facility] Provider Note   CSN: [id number] Arrival date & time: 02/03/23  2011     History  Chief Complaint  Patient presents with   Chest Pain    Carlisle Beers Zeev Wiant. is a 57 y.o. male with past medical history significant for alcohol use, pulmonary embolism, previous DVT who presents with concern for 10/10 chest pain as well as pain to the right lower extremity on arrival.  He had a DVT in the right lower extremity diagnosed on 9/24, and has been taking Eliquis since then.  He reports no missed doses.  He denies shortness of breath, hemoptysis.  He did unfortunately have a protracted stay in the waiting room, at time of my evaluation patient reports that he is overall chest pain free, has some mild central chest discomfort, reports not worse with deep breathing.  Wife had asked about filling out disability paperwork and wanted [place] to encourage smoking cessation and alcohol abuse counseling.   Chest Pain      Home Medications Prior to Admission medications   Medication Sig Start Date End Date Taking? Authorizing Provider  apixaban (ELIQUIS) 5 MG TABS tablet Take 1 tablet (5 mg total) by mouth 2 (two) times daily. Start after starter pack is completed 12/11/22   Regalado, Jon Billings A, MD  APIXABAN (ELIQUIS) VTE STARTER PACK (10MG  AND 5MG ) Take as directed on package: start with two-5mg  tablets twice daily for 7 days. On day 8, switch to one-5mg  tablet twice daily. 11/14/22   Regalado, Belkys A, MD  Multiple Vitamin (MULTIVITAMIN WITH MINERALS) TABS tablet Take 1 tablet by mouth daily. 11/15/22   Regalado, Belkys A, MD  nicotine (NICODERM CQ - DOSED IN MG/24 HOURS) 14 mg/24hr patch Place 1 patch (14 mg total) onto the skin daily. 11/15/22   Regalado, Belkys A, MD  thiamine (VITAMIN B1) 100 MG tablet Take 1 tablet (100 mg total) by mouth daily. 11/15/22   Regalado, Jon Billings A, MD      Allergies    Penicillins    Review of Systems   Review of  Systems  Cardiovascular:  Positive for chest pain.  All other systems reviewed and are negative.   Physical Exam Updated Vital Signs BP (!) 139/95 (BP Location: Left Arm)   Pulse 96   Temp 98.8 F (37.1 C) (Oral)   Resp 16   Ht 6\' 3"  (1.905 m)   Wt 74.8 kg   SpO2 100%   BMI 20.62 kg/m  Physical Exam Vitals and nursing note reviewed.  Constitutional:      General: He is not in acute distress.    Appearance: Normal appearance.  HENT:     Head: Normocephalic and atraumatic.  Eyes:     General:        Right eye: No discharge.        Left eye: No discharge.  Cardiovascular:     Rate and Rhythm: Normal rate and regular rhythm.     Heart sounds: No murmur heard.    No friction rub. No gallop.  Pulmonary:     Effort: Pulmonary effort is normal.     Breath sounds: Normal breath sounds.     Comments: No wheezing, rhonchi, stridor, rales, no respiratory distress Abdominal:     General: Bowel sounds are normal.     Palpations: Abdomen is soft.  Musculoskeletal:     Comments: There is around 1+ lower extremity edema of the right lower extremity without warmth, redness.  Improved in location of compression stockings.  Skin:    General: Skin is warm and dry.     Capillary Refill: Capillary refill takes less than 2 seconds.  Neurological:     Mental Status: He is alert and oriented to person, place, and time.  Psychiatric:        Mood and Affect: Mood normal.        Behavior: Behavior normal.     ED Results / Procedures / Treatments   Labs (all labs ordered are listed, but only abnormal results are displayed) Labs Reviewed  BASIC METABOLIC PANEL - Abnormal; Notable for the following components:      Result Value   CO2 20 (*)    Anion gap 16 (*)    All other components within normal limits  CBC - Abnormal; Notable for the following components:   RBC 7.05 (*)    MCV 64.8 (*)    MCH 21.6 (*)    RDW 19.9 (*)    All other components within normal limits  TROPONIN I (HIGH  SENSITIVITY)  TROPONIN I (HIGH SENSITIVITY)    EKG EKG Interpretation Date/Time:  Monday February 03 2023 20:20:22 EST Ventricular Rate:  95 PR Interval:  140 QRS Duration:  70 QT Interval:  366 QTC Calculation: 459 R Axis:   75  Text Interpretation: Normal sinus rhythm Normal ECG When compared with ECG of 11-Nov-2022 16:25, PREVIOUS ECG IS PRESENT Confirmed by Susy Frizzle [phone]) on 02/04/2023 8:42:48 AM  Radiology CT Angio Chest PE W/Cm &/Or Wo Cm  Result Date: 02/04/2023 CLINICAL DATA:  Pulmonary embolism (PE) suspected, high prob. Chest pain EXAM: CT ANGIOGRAPHY CHEST WITH CONTRAST TECHNIQUE: Multidetector CT imaging of the chest was performed using the standard protocol during bolus administration of intravenous contrast. Multiplanar CT image reconstructions and MIPs were obtained to evaluate the vascular anatomy. RADIATION DOSE REDUCTION: This exam was performed according to the departmental dose-optimization program which includes automated exposure control, adjustment of the mA and/or kV according to patient size and/or use of iterative reconstruction technique. CONTRAST:  75mL OMNIPAQUE IOHEXOL 350 MG/ML SOLN COMPARISON:  11/11/2022 FINDINGS: Cardiovascular: Previously seen bilateral pulmonary emboli have resolved. No current pulmonary emboli. Heart is normal size. Aorta is normal caliber. Mediastinum/Nodes: No mediastinal, hilar, or axillary adenopathy. Trachea and esophagus are unremarkable. Thyroid unremarkable. Lungs/Pleura: Biapical pleural/parenchymal scarring, stable. No acute confluent opacities or effusions. Upper Abdomen: No acute findings Musculoskeletal: Chest wall soft tissues are unremarkable. No acute bony abnormality. Review of the MIP images confirms the above findings. IMPRESSION: Previously seen bilateral pulmonary emboli have resolved. No evidence for acute pulmonary emboli. No acute cardiopulmonary disease. Electronically Signed   By: Charlett Nose M.D.   On:  02/04/2023 00:44   DG Chest 2 View  Result Date: 02/03/2023 CLINICAL DATA:  Chest pain EXAM: CHEST - 2 VIEW COMPARISON:  Chest radiograph dated 11/11/2022 FINDINGS: Normal lung volumes. No focal consolidations. No pleural effusion or pneumothorax. The heart size and mediastinal contours are within normal limits. No acute osseous abnormality. IMPRESSION: No active cardiopulmonary disease. Electronically Signed   By: Agustin Cree M.D.   On: 02/03/2023 21:50    Procedures Procedures    Medications Ordered in ED Medications  iohexol (OMNIPAQUE) 350 MG/ML injection 75 mL (75 mLs Intravenous Contrast Given 02/03/23 2357)    ED Course/ Medical Decision Making/ A&P  Medical Decision Making Amount and/or Complexity of Data Reviewed Labs: ordered. Radiology: ordered.   This patient is a 57 y.o. male  who presents to the ED for concern of chest pain, right lower extremity swelling.   Differential diagnoses prior to evaluation: The emergent differential diagnosis includes, but is not limited to,  ACS, AAS, PE, Mallory-Weiss, Boerhaave's, Pneumonia, acute bronchitis, asthma or COPD exacerbation, anxiety, MSK pain or traumatic injury to the chest, acid reflux versus other, .ddx . This is not an exhaustive differential. DVT, peripheral edema, Achilles tendon rupture, calf muscle strain, calf muscle rupture, cellulitis, abscess, PAD, PVD, versus other musculoskeletal injury  Past Medical History / Co-morbidities / Social History: DVT, PE, tobacco abuse, hypertension  Additional history: Chart reviewed. Pertinent results include: I extensively reviewed lab work, imaging, especially from patient's recent hospital admission for PE.  Physical Exam: Physical exam performed. The pertinent findings include: Patient with stable vital signs, normal oxygen saturation on room air, he reports being chest pain-free at time of my evaluation, he does have some mild right lower  extremity edema, discussed that some vascular compromise after recent large DVT is to be expected, no significant pain, redness, given clear CT scan I do not think that additional evaluation for DVT is warranted at this time in the ED as patient continues to be compliant with his anticoagulant.    Lab Tests/Imaging studies: I personally interpreted labs/imaging and the pertinent results include: CBC overall unremarkable other than microcytic quality of red blood cells without anemia.  BMP notable for bicarb deficit, CO2 20 he does have a mild anion gap at 16, unclear etiology, may be secondary to some bicarb retention.troponin negative x 2, chest pain resolved at time of repeat eval.  I independently interpreted CT angio chest PE study which shows no evidence of PE, resolved PE from recent hospital admission in September.  I agree with the radiologist interpretation.  Cardiac monitoring: EKG obtained and interpreted by myself and attending physician which shows: Normal sinus rhythm   Disposition: After consideration of the diagnostic results and the patients response to treatment, I feel that on repeat evaluation as patient is feeling improved and there is no evidence of additional PE, in fact the PE is seen during his admission in September has resolved I do think the patient is stable for discharge at this time.  Encouraged him to continue his anticoagulant use, encouraged Tylenol for pain, encouraged follow-up with PCP.   emergency department workup does not suggest an emergent condition requiring admission or immediate intervention beyond what has been performed at this time. The plan is: as above. The patient is safe for discharge and has been instructed to return immediately for worsening symptoms, change in symptoms or any other concerns.  Final Clinical Impression(s) / ED Diagnoses Final diagnoses:  Chest pain, unspecified type  Swelling of right lower extremity  Tobacco abuse    Rx / DC  Orders ED Discharge Orders     None         [place] 02/04/23 1050    Gerhard Munch, MD 02/04/23 1530

## 2023-02-04 NOTE — Discharge Instructions (Addendum)
Please use Tylenol for pain.  You may use 1000 mg of Tylenol every 6 hours.  Not to exceed 4 g of Tylenol within 24 hours.  Your workup today was very reassuring, there is no evidence of a new pulmonary embolus, your blood thinner is working appropriately, it is not unusual to have some ongoing chest discomfort when you do have 2 large clots in your lungs as you are body continues to recover the damage done from that injury, additionally having a large blood clot in one of the veins of your leg can lead to some overall venous impairment, though, you are doing the right thing with wearing compression stockings but you still may notice some swelling of the lower extremity sometimes some pain, as long as you are taking your blood thinner as prescribed and decreasing other risks for blood clots such as smoking should not develop an additional blood clot in the blood clot that formed in the leg should eventually dissolve and become pain-free.

## 2023-07-30 ENCOUNTER — Emergency Department (HOSPITAL_COMMUNITY)
Admission: EM | Admit: 2023-07-30 | Discharge: 2023-07-30 | Disposition: A | Discharge status: Home / Self Care | Attending: Student | Admitting: Student

## 2023-07-30 ENCOUNTER — Emergency Department (HOSPITAL_COMMUNITY)

## 2023-07-30 ENCOUNTER — Other Ambulatory Visit: Payer: Self-pay

## 2023-07-30 ENCOUNTER — Encounter (HOSPITAL_COMMUNITY): Payer: Self-pay

## 2023-07-30 DIAGNOSIS — R072 Precordial pain: Secondary | ICD-10-CM | POA: Diagnosis present

## 2023-07-30 DIAGNOSIS — F17210 Nicotine dependence, cigarettes, uncomplicated: Secondary | ICD-10-CM | POA: Insufficient documentation

## 2023-07-30 DIAGNOSIS — I10 Essential (primary) hypertension: Secondary | ICD-10-CM | POA: Insufficient documentation

## 2023-07-30 DIAGNOSIS — R109 Unspecified abdominal pain: Secondary | ICD-10-CM | POA: Insufficient documentation

## 2023-07-30 DIAGNOSIS — Z7901 Long term (current) use of anticoagulants: Secondary | ICD-10-CM | POA: Diagnosis not present

## 2023-07-30 DIAGNOSIS — E162 Hypoglycemia, unspecified: Secondary | ICD-10-CM | POA: Insufficient documentation

## 2023-07-30 DIAGNOSIS — Y905 Blood alcohol level of 100-119 mg/100 ml: Secondary | ICD-10-CM | POA: Diagnosis not present

## 2023-07-30 DIAGNOSIS — F1090 Alcohol use, unspecified, uncomplicated: Secondary | ICD-10-CM | POA: Diagnosis not present

## 2023-07-30 DIAGNOSIS — R0789 Other chest pain: Secondary | ICD-10-CM

## 2023-07-30 LAB — I-STAT CHEM 8, ED
BUN: 4 mg/dL — ABNORMAL LOW (ref 6–20)
Calcium, Ion: 0.94 mmol/L — ABNORMAL LOW (ref 1.15–1.40)
Chloride: 103 mmol/L (ref 98–111)
Creatinine, Ser: 1.3 mg/dL — ABNORMAL HIGH (ref 0.61–1.24)
Glucose, Bld: 64 mg/dL — ABNORMAL LOW (ref 70–99)
HCT: 44 % (ref 39.0–52.0)
Hemoglobin: 15 g/dL (ref 13.0–17.0)
Potassium: 4 mmol/L (ref 3.5–5.1)
Sodium: 138 mmol/L (ref 135–145)
TCO2: 22 mmol/L (ref 22–32)

## 2023-07-30 LAB — CBC WITH DIFFERENTIAL/PLATELET
Abs Immature Granulocytes: 0.02 10*3/uL (ref 0.00–0.07)
Basophils Absolute: 0.1 10*3/uL (ref 0.0–0.1)
Basophils Relative: 1 %
Eosinophils Absolute: 0.1 10*3/uL (ref 0.0–0.5)
Eosinophils Relative: 1 %
HCT: 37.1 % — ABNORMAL LOW (ref 39.0–52.0)
Hemoglobin: 12.4 g/dL — ABNORMAL LOW (ref 13.0–17.0)
Immature Granulocytes: 0 %
Lymphocytes Relative: 30 %
Lymphs Abs: 1.5 10*3/uL (ref 0.7–4.0)
MCH: 23.5 pg — ABNORMAL LOW (ref 26.0–34.0)
MCHC: 33.4 g/dL (ref 30.0–36.0)
MCV: 70.4 fL — ABNORMAL LOW (ref 80.0–100.0)
Monocytes Absolute: 0.6 10*3/uL (ref 0.1–1.0)
Monocytes Relative: 12 %
Neutro Abs: 2.7 10*3/uL (ref 1.7–7.7)
Neutrophils Relative %: 56 %
Platelets: 251 10*3/uL (ref 150–400)
RBC: 5.27 MIL/uL (ref 4.22–5.81)
RDW: 16.3 % — ABNORMAL HIGH (ref 11.5–15.5)
WBC: 4.8 10*3/uL (ref 4.0–10.5)
nRBC: 0 % (ref 0.0–0.2)

## 2023-07-30 LAB — COMPREHENSIVE METABOLIC PANEL WITH GFR
ALT: 24 U/L (ref 0–44)
AST: 49 U/L — ABNORMAL HIGH (ref 15–41)
Albumin: 3.4 g/dL — ABNORMAL LOW (ref 3.5–5.0)
Alkaline Phosphatase: 70 U/L (ref 38–126)
Anion gap: 16 — ABNORMAL HIGH (ref 5–15)
BUN: 5 mg/dL — ABNORMAL LOW (ref 6–20)
CO2: 21 mmol/L — ABNORMAL LOW (ref 22–32)
Calcium: 8.6 mg/dL — ABNORMAL LOW (ref 8.9–10.3)
Chloride: 103 mmol/L (ref 98–111)
Creatinine, Ser: 0.93 mg/dL (ref 0.61–1.24)
GFR, Estimated: >60 mL/min (ref >60)
Glucose, Bld: 61 mg/dL — ABNORMAL LOW (ref 70–99)
Potassium: 4 mmol/L (ref 3.5–5.1)
Sodium: 140 mmol/L (ref 135–145)
Total Bilirubin: 0.5 mg/dL (ref 0.0–1.2)
Total Protein: 6.4 g/dL — ABNORMAL LOW (ref 6.5–8.1)

## 2023-07-30 LAB — CBC
HCT: 24.1 % — ABNORMAL LOW (ref 39.0–52.0)
Hemoglobin: 7.5 g/dL — ABNORMAL LOW (ref 13.0–17.0)
MCH: 23.4 pg — ABNORMAL LOW (ref 26.0–34.0)
MCHC: 31.1 g/dL (ref 30.0–36.0)
MCV: 75.3 fL — ABNORMAL LOW (ref 80.0–100.0)
Platelets: 74 10*3/uL — ABNORMAL LOW (ref 150–400)
RBC: 3.2 MIL/uL — ABNORMAL LOW (ref 4.22–5.81)
RDW: 18.6 % — ABNORMAL HIGH (ref 11.5–15.5)
WBC: 3.2 10*3/uL — ABNORMAL LOW (ref 4.0–10.5)
nRBC: 0 % (ref 0.0–0.2)

## 2023-07-30 LAB — TROPONIN I (HIGH SENSITIVITY)
Troponin I (High Sensitivity): 4 ng/L (ref <18)
Troponin I (High Sensitivity): 4 ng/L (ref <18)

## 2023-07-30 LAB — ETHANOL: Alcohol, Ethyl (B): 110 mg/dL — ABNORMAL HIGH (ref <15)

## 2023-07-30 LAB — CBG MONITORING, ED: Glucose-Capillary: 86 mg/dL (ref 70–99)

## 2023-07-30 MED ORDER — ONDANSETRON HCL 4 MG/2ML IJ SOLN
4.0000 mg | Freq: Once | INTRAMUSCULAR | Status: AC
  Administered 2023-07-30: 4 mg via INTRAVENOUS
  Filled 2023-07-30: qty 2

## 2023-07-30 MED ORDER — IOHEXOL 350 MG/ML SOLN
75.0000 mL | Freq: Once | INTRAVENOUS | Status: AC | PRN
  Administered 2023-07-30: 75 mL via INTRAVENOUS

## 2023-07-30 MED ORDER — LACTATED RINGERS IV BOLUS
1000.0000 mL | Freq: Once | INTRAVENOUS | Status: AC
  Administered 2023-07-30: 1000 mL via INTRAVENOUS

## 2023-07-30 MED ORDER — MORPHINE SULFATE (PF) 4 MG/ML IV SOLN
4.0000 mg | Freq: Once | INTRAVENOUS | Status: AC
  Administered 2023-07-30: 4 mg via INTRAVENOUS
  Filled 2023-07-30: qty 1

## 2023-07-30 NOTE — ED Provider Notes (Signed)
 [place] EMERGENCY DEPARTMENT AT [facility] Provider Note  CSN: [id number] Arrival date & time: 07/30/23 1730  Chief Complaint(s) Chest Pain  HPI Travis L Barkan Jr. is a 58 y.o. male with PMH alcohol abuse, HTN, pulmonary embolism with Eliquis  noncompliance who presents emergency room for evaluation of chest pain and flank pain.  States that midsternal chest pain started 2 hours ago and his flank pain is worse on the right.  He states that he has actually had the symptoms for 3 years he just tries to deal with them at home the best he can.  His wife encouraged him to come to the emergency department due to his previous history of pulmonary embolus.   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 Authorizing Provider  apixaban  (ELIQUIS ) 5 MG TABS tablet Take 1 tablet (5 mg total) by mouth 2 (two) times daily. Start after starter pack is completed Patient not taking: Reported on 07/30/2023 12/11/22   Regalado, Clifford Dam A, MD  APIXABAN  (ELIQUIS ) VTE STARTER PACK (10MG  AND 5MG ) Take as directed on package: start with two-5mg  tablets twice daily for 7 days. On day 8, switch to one-5mg  tablet twice daily. Patient not taking: Reported on 07/30/2023 11/14/22   Danette Duos, MD                                                                                                                                     Past Surgical History Past Surgical History:  Procedure Laterality Date   WRIST SURGERY Left    Family History Family History  Problem Relation Age of Onset   Diabetes Mother    Hyperlipidemia Mother    CAD Father     Social History Social History   Tobacco Use   Smoking status: Every Day    Current packs/day: 0.25    Types: Cigarettes   Smokeless tobacco: Never  Substance Use Topics   Alcohol use: Yes    Comment: 1-2 cans of beer -daily   Drug use: Yes    Types: Marijuana   Allergies Penicillins  Review of Systems Review of Systems  Cardiovascular:  Positive for chest pain.  Genitourinary:  Positive for  flank pain.    Physical Exam Vital Signs  I have reviewed the triage vital signs BP (!) 129/95   Pulse 87   Temp 97.9 F (36.6 C) (Oral)   Resp 12   Ht 6\' 3"  (1.905 m)   Wt 88.5 kg   SpO2 100%   BMI 24.37 kg/m   Physical Exam Constitutional:      General: He is not in acute distress.    Appearance: Normal appearance.  HENT:     Head: Normocephalic and atraumatic.     Nose: No congestion or rhinorrhea.  Eyes:     General:        Right eye: No discharge.        Left eye: No discharge.     Extraocular Movements: Extraocular movements intact.     Pupils: Pupils are equal, round, and reactive to light.  Cardiovascular:     Rate and Rhythm: Normal rate and regular rhythm.     Heart sounds: No murmur heard. Pulmonary:     Effort: No respiratory distress.     Breath sounds: No wheezing or rales.  Abdominal:     General: There is no distension.     Tenderness: There is no abdominal tenderness. There is right CVA tenderness.  Musculoskeletal:        General: Tenderness present. Normal range of motion.     Cervical back: Normal range of motion.  Skin:    General: Skin is warm and dry.  Neurological:     General: No focal deficit  present.     Mental Status: He is alert.     ED Results and Treatments Labs (all labs ordered are listed, but only abnormal results are displayed) Labs Reviewed  CBC - Abnormal; Notable for the following components:      Result Value   WBC 3.2 (*)    RBC 3.20 (*)    Hemoglobin 7.5 (*)    HCT 24.1 (*)    MCV 75.3 (*)    MCH 23.4 (*)    RDW 18.6 (*)    Platelets 74 (*)    All other components within normal limits  ETHANOL - Abnormal; Notable for the following components:   Alcohol, Ethyl (B) 110 (*)    All other components within normal limits  COMPREHENSIVE METABOLIC PANEL WITH GFR - Abnormal; Notable for the following components:   CO2 21 (*)    Glucose, Bld 61 (*)    BUN 5 (*)    Calcium 8.6 (*)    Total Protein 6.4 (*)    Albumin  3.4 (*)    AST 49 (*)    Anion gap 16 (*)    All other components within normal limits  CBC WITH DIFFERENTIAL/PLATELET - Abnormal; Notable for the following components:   Hemoglobin 12.4 (*)    HCT 37.1 (*)    MCV 70.4 (*)    MCH 23.5 (*)    RDW 16.3 (*)    All other components within normal limits  I-STAT CHEM 8, ED - Abnormal; Notable for the following components:   BUN 4 (*)    Creatinine, Ser 1.30 (*)    Glucose, Bld 64 (*)    Calcium, Ion 0.94 (*)    All other components within normal limits  CBG MONITORING, ED  TROPONIN I (HIGH SENSITIVITY)  TROPONIN I (HIGH SENSITIVITY)  Radiology CT Angio Chest PE W and/or Wo Contrast Result Date: 07/30/2023 CLINICAL DATA:  High probability for PE.  Right lower quadrant pain. EXAM: CT ANGIOGRAPHY CHEST CT ABDOMEN AND PELVIS WITH CONTRAST TECHNIQUE: Multidetector CT imaging of the chest was performed using the standard protocol during bolus administration of intravenous contrast. Multiplanar CT image reconstructions and MIPs were obtained to evaluate the vascular anatomy.  Multidetector CT imaging of the abdomen and pelvis was performed using the standard protocol during bolus administration of intravenous contrast. RADIATION DOSE REDUCTION: This exam was performed according to the departmental dose-optimization program which includes automated exposure control, adjustment of the mA and/or kV according to patient size and/or use of iterative reconstruction technique. CONTRAST:  75mL OMNIPAQUE  IOHEXOL  350 MG/ML SOLN COMPARISON:  CT angiogram chest 02/03/2023. FINDINGS: CTA CHEST FINDINGS Cardiovascular: Satisfactory opacification of the pulmonary arteries to the segmental level. No evidence of pulmonary embolism. Normal heart size. No pericardial effusion. Mediastinum/Nodes: No enlarged mediastinal, hilar, or axillary lymph nodes. Thyroid gland, trachea, and esophagus demonstrate no significant findings. Lungs/Pleura: Subsolid nodular area in the right lung apex measuring 9 mm appears unchanged. Ill-defined nodular density in the left lung apex with mixed ground-glass and solid component measures 9 mm, unchanged. Focal slightly nodular density in the left lung apex image 4/33 measures 5 mm and appears unchanged. There is no new focal lung infiltrate. There are no new pulmonary nodules. There is no pleural effusion or pneumothorax. Musculoskeletal: No chest wall abnormality. No acute or significant osseous findings. Review of the MIP images confirms the above findings. CT ABDOMEN and PELVIS FINDINGS Hepatobiliary: No focal liver abnormality is seen. No gallstones, gallbladder wall thickening, or biliary dilatation. Pancreas: Unremarkable. No pancreatic ductal dilatation or surrounding inflammatory changes. Spleen: Normal in size without focal abnormality. Adrenals/Urinary Tract: Adrenal glands are unremarkable. Kidneys are normal, without renal calculi, focal lesion, or hydronephrosis. Bladder is unremarkable. Stomach/Bowel: Stomach is within normal limits. Appendix appears normal. No  evidence of bowel wall thickening, distention, or inflammatory changes. Vascular/Lymphatic: Aortic atherosclerosis. No enlarged abdominal or pelvic lymph nodes. Reproductive: Prostate gland is enlarged. Other: No abdominal wall hernia or abnormality. No abdominopelvic ascites. Musculoskeletal: There is a single 6 mm sclerotic density in the left iliac bone, possibly a bone island. Otherwise, osseous structures are within normal limits. Review of the MIP images confirms the above findings. IMPRESSION: 1. No evidence for pulmonary embolism. 2. No acute localizing process in the chest, abdomen or pelvis. 3. Stable bilateral apical pulmonary nodules measuring up to 9 mm, unchanged from 2020 and likely benign. 4. Aortic atherosclerosis. Aortic Atherosclerosis (ICD10-I70.0). Electronically Signed   By: Tyron Gallon M.D.   On: 07/30/2023 21:34   CT ABDOMEN PELVIS W CONTRAST Result Date: 07/30/2023 CLINICAL DATA:  High probability for PE.  Right lower quadrant pain. EXAM: CT ANGIOGRAPHY CHEST CT ABDOMEN AND PELVIS WITH CONTRAST TECHNIQUE: Multidetector CT imaging of the chest was performed using the standard protocol during bolus administration of intravenous contrast. Multiplanar CT image reconstructions and MIPs were obtained to evaluate the vascular anatomy. Multidetector CT imaging of the abdomen and pelvis was performed using the standard protocol during bolus administration of intravenous contrast. RADIATION DOSE REDUCTION: This exam was performed according to the departmental dose-optimization program which includes automated exposure control, adjustment of the mA and/or kV according to patient size and/or use of iterative reconstruction technique. CONTRAST:  75mL OMNIPAQUE  IOHEXOL  350 MG/ML SOLN COMPARISON:  CT angiogram chest 02/03/2023. FINDINGS: CTA CHEST FINDINGS Cardiovascular: Satisfactory opacification of the pulmonary  arteries to the segmental level. No evidence of pulmonary embolism. Normal heart size.  No pericardial effusion. Mediastinum/Nodes: No enlarged mediastinal, hilar, or axillary lymph nodes. Thyroid gland, trachea, and esophagus demonstrate no significant findings. Lungs/Pleura: Subsolid nodular area in the right lung apex measuring 9 mm appears unchanged. Ill-defined nodular density in the left lung apex with mixed ground-glass and solid component measures 9 mm, unchanged. Focal slightly nodular density in the left lung apex image 4/33 measures 5 mm and appears unchanged. There is no new focal lung infiltrate. There are no new pulmonary nodules. There is no pleural effusion or pneumothorax. Musculoskeletal: No chest wall abnormality. No acute or significant osseous findings. Review of the MIP images confirms the above findings. CT ABDOMEN and PELVIS FINDINGS Hepatobiliary: No focal liver abnormality is seen. No gallstones, gallbladder wall thickening, or biliary dilatation. Pancreas: Unremarkable. No pancreatic ductal dilatation or surrounding inflammatory changes. Spleen: Normal in size without focal abnormality. Adrenals/Urinary Tract: Adrenal glands are unremarkable. Kidneys are normal, without renal calculi, focal lesion, or hydronephrosis. Bladder is unremarkable. Stomach/Bowel: Stomach is within normal limits. Appendix appears normal. No evidence of bowel wall thickening, distention, or inflammatory changes. Vascular/Lymphatic: Aortic atherosclerosis. No enlarged abdominal or pelvic lymph nodes. Reproductive: Prostate gland is enlarged. Other: No abdominal wall hernia or abnormality. No abdominopelvic ascites. Musculoskeletal: There is a single 6 mm sclerotic density in the left iliac bone, possibly a bone island. Otherwise, osseous structures are within normal limits. Review of the MIP images confirms the above findings. IMPRESSION: 1. No evidence for pulmonary embolism. 2. No acute localizing process in the chest, abdomen or pelvis. 3. Stable bilateral apical pulmonary nodules measuring up to 9  mm, unchanged from 2020 and likely benign. 4. Aortic atherosclerosis. Aortic Atherosclerosis (ICD10-I70.0). Electronically Signed   By: Tyron Gallon M.D.   On: 07/30/2023 21:34   DG Chest 2 View Result Date: 07/30/2023 CLINICAL DATA:  Chest pain. Midsternal chest pain starting 2 hours ago. Generalized malaise. Daily drinker. Previous history of pulmonary embolus and DVT. EXAM: CHEST - 2 VIEW COMPARISON:  02/03/2023 FINDINGS: Heart size and pulmonary vascularity are normal. Lungs are clear and expanded. No airspace disease or consolidation. No pleural effusion or pneumothorax. Mediastinal contours appear intact. IMPRESSION: No active cardiopulmonary disease. Electronically Signed   By: Boyce Byes M.D.   On: 07/30/2023 18:41    Pertinent labs & imaging results that were available during my care of the patient were reviewed by me and considered in my medical decision making (see MDM for details).  Medications Ordered in ED Medications  morphine  (PF) 4 MG/ML injection 4 mg (4 mg Intravenous Given 07/30/23 1849)  ondansetron  (ZOFRAN ) injection 4 mg (4 mg Intravenous Given 07/30/23 1848)  lactated ringers  bolus 1,000 mL (0 mLs Intravenous Stopped 07/30/23 2004)  morphine  (PF) 4 MG/ML injection 4 mg (4 mg Intravenous Given 07/30/23 2014)  iohexol  (OMNIPAQUE ) 350 MG/ML injection 75 mL (75 mLs Intravenous Contrast Given 07/30/23 2106)  Procedures Procedures  (including critical care time)  Medical Decision Making / ED Course   This patient presents to the ED for concern of chest pain, abdominal pain, this involves an extensive number of treatment options, and is a complaint that carries with it a high risk of complications and morbidity.  The differential diagnosis includes ACS, Aortic Dissection, Pneumothorax, Pneumonia, Esophageal Rupture, PE, Tamponade/Pericardial  Effusion, pericarditis, esophageal spasm, dysrhythmia, GERD, costochondritis.  MDM: Patient seen emergency room for evaluation of chest and flank pain.  Physical exam with right CVA tenderness but is otherwise unremarkable.  Pulmonary exam is unremarkable.  Patient is clearly intoxicated does admit to alcohol use today.  Initial laboratory evaluation with pancytopenia but this was done with a blood draw that was otherwise canceled for hemolysis.  Laboratory evaluation was recollected and there was no pancytopenia with a hemoglobin of 12.4 but is otherwise unremarkable.  Creatinine 1.3 but patient is hypoglycemic to 61.  Glucose orally repleted with improvement.  Chest x-ray unremarkable.  PE study reassuringly negative for pulmonary embolus and shows stable pulmonary nodules and CT abdomen pelvis is unremarkable.  Patient pain controlled but at this time does not meet inpatient criteria for admission.  He was encouraged to stop drinking alcohol as this is likely causing symptomatic hypoglycemia which may be explaining his symptoms today.  He was also encouraged to take his Eliquis  as prescribed.  Patient then discharged with outpatient follow-up.  Return precautions given which he and his wife voiced understanding.   Additional history obtained: -Additional history obtained from wife -External records from outside source obtained and reviewed including: Chart review including previous notes, labs, imaging, consultation notes   Lab Tests: -I ordered, reviewed, and interpreted labs.   The pertinent results include:   Labs Reviewed  CBC - Abnormal; Notable for the following components:      Result Value   WBC 3.2 (*)    RBC 3.20 (*)    Hemoglobin 7.5 (*)    HCT 24.1 (*)    MCV 75.3 (*)    MCH 23.4 (*)    RDW 18.6 (*)    Platelets 74 (*)    All other components within normal limits  ETHANOL - Abnormal; Notable for the following components:   Alcohol, Ethyl (B) 110 (*)    All other components  within normal limits  COMPREHENSIVE METABOLIC PANEL WITH GFR - Abnormal; Notable for the following components:   CO2 21 (*)    Glucose, Bld 61 (*)    BUN 5 (*)    Calcium 8.6 (*)    Total Protein 6.4 (*)    Albumin  3.4 (*)    AST 49 (*)    Anion gap 16 (*)    All other components within normal limits  CBC WITH DIFFERENTIAL/PLATELET - Abnormal; Notable for the following components:   Hemoglobin 12.4 (*)    HCT 37.1 (*)    MCV 70.4 (*)    MCH 23.5 (*)    RDW 16.3 (*)    All other components within normal limits  I-STAT CHEM 8, ED - Abnormal; Notable for the following components:   BUN 4 (*)    Creatinine, Ser 1.30 (*)    Glucose, Bld 64 (*)    Calcium, Ion 0.94 (*)    All other components within normal limits  CBG MONITORING, ED  TROPONIN I (HIGH SENSITIVITY)  TROPONIN I (HIGH SENSITIVITY)      EKG   EKG Interpretation Date/Time:  Wednesday July 30 2023 17:40:46 EDT Ventricular Rate:  86 PR Interval:  164 QRS Duration:  74 QT Interval:  357 QTC Calculation: 427 R Axis:   45  Text Interpretation: Sinus rhythm Probable anteroseptal infarct, old Confirmed by Xane Amsden ([id number]) on 07/30/2023 10:29:05 PM         Imaging Studies ordered: I ordered imaging studies including chest x-ray, CT PE, CTAP I independently visualized and interpreted imaging. I agree with the radiologist interpretation   Medicines ordered and prescription drug management: Meds ordered this encounter  Medications   morphine  (PF) 4 MG/ML injection 4 mg   ondansetron  (ZOFRAN ) injection 4 mg   lactated ringers  bolus 1,000 mL   morphine  (PF) 4 MG/ML injection 4 mg   iohexol  (OMNIPAQUE ) 350 MG/ML injection 75 mL    -I have reviewed the patients home medicines and have made adjustments as needed  Critical interventions none    Cardiac Monitoring: The patient was maintained on a cardiac monitor.  I personally viewed and interpreted the cardiac monitored which showed an underlying rhythm  of: NSR  Social Determinants of Health:  Factors impacting patients care include: Alcohol use, encouraged cessation   Reevaluation: After the interventions noted above, I reevaluated the patient and found that they have :improved  Co morbidities that complicate the patient evaluation  Past Medical History:  Diagnosis Date   Chest pain    ETOH abuse    Hypertension    Pulmonary embolism (HCC)    Pulmonary nodules       Dispostion: I considered admission for this patient, but at this time he does not meet inpatient criteria for admission will be discharged outpatient follow-up     Final Clinical Impression(s) / ED Diagnoses Final diagnoses:  Atypical chest pain  Flank pain  Hypoglycemia  Alcohol use     @PCDICTATION @    Lorianne Malbrough, Alyse July, MD 07/30/23 [phone]

## 2023-07-30 NOTE — ED Triage Notes (Signed)
 BIB EMS from home for midsternal chest pain that started 2 hours ago. C/o general malaise, daily drinker, has had alcohol today.  Hx of PE and DVT, noncompliant with eliquis  for 2 months.  324 ASA and 2 nitroglycerin with EMS, pain went from 10/10 to 9/10.
